# Patient Record
Sex: Female | Born: 1960 | Race: White | Hispanic: No | Marital: Married | Smoking: Never smoker
Health system: Southern US, Community
[De-identification: ages and names within clinical notes are randomized; demographics above are authoritative.]

## PROBLEM LIST (undated history)

## (undated) DIAGNOSIS — F439 Reaction to severe stress, unspecified: Secondary | ICD-10-CM

## (undated) DIAGNOSIS — M1990 Unspecified osteoarthritis, unspecified site: Secondary | ICD-10-CM

## (undated) DIAGNOSIS — K219 Gastro-esophageal reflux disease without esophagitis: Secondary | ICD-10-CM

## (undated) DIAGNOSIS — D649 Anemia, unspecified: Secondary | ICD-10-CM

## (undated) DIAGNOSIS — H3530 Unspecified macular degeneration: Secondary | ICD-10-CM

## (undated) DIAGNOSIS — C73 Malignant neoplasm of thyroid gland: Secondary | ICD-10-CM

## (undated) DIAGNOSIS — K5900 Constipation, unspecified: Secondary | ICD-10-CM

## (undated) DIAGNOSIS — K635 Polyp of colon: Secondary | ICD-10-CM

## (undated) DIAGNOSIS — M2550 Pain in unspecified joint: Secondary | ICD-10-CM

## (undated) DIAGNOSIS — H35069 Retinal vasculitis, unspecified eye: Secondary | ICD-10-CM

## (undated) DIAGNOSIS — E039 Hypothyroidism, unspecified: Secondary | ICD-10-CM

## (undated) DIAGNOSIS — T7840XA Allergy, unspecified, initial encounter: Secondary | ICD-10-CM

## (undated) DIAGNOSIS — L739 Follicular disorder, unspecified: Secondary | ICD-10-CM

## (undated) DIAGNOSIS — M549 Dorsalgia, unspecified: Secondary | ICD-10-CM

## (undated) DIAGNOSIS — L709 Acne, unspecified: Secondary | ICD-10-CM

## (undated) DIAGNOSIS — H269 Unspecified cataract: Secondary | ICD-10-CM

## (undated) DIAGNOSIS — E785 Hyperlipidemia, unspecified: Secondary | ICD-10-CM

## (undated) HISTORY — DX: Pain in unspecified joint: M25.50

## (undated) HISTORY — DX: Malignant neoplasm of thyroid gland: C73

## (undated) HISTORY — DX: Hypothyroidism, unspecified: E03.9

## (undated) HISTORY — DX: Acne, unspecified: L70.9

## (undated) HISTORY — DX: Retinal vasculitis, unspecified eye: H35.069

## (undated) HISTORY — DX: Follicular disorder, unspecified: L73.9

## (undated) HISTORY — DX: Constipation, unspecified: K59.00

## (undated) HISTORY — PX: BREAST BIOPSY: SHX20

## (undated) HISTORY — DX: Unspecified osteoarthritis, unspecified site: M19.90

## (undated) HISTORY — PX: TUBAL LIGATION: SHX77

## (undated) HISTORY — DX: Gastro-esophageal reflux disease without esophagitis: K21.9

## (undated) HISTORY — PX: TOTAL ABDOMINAL HYSTERECTOMY W/ BILATERAL SALPINGOOPHORECTOMY: SHX83

## (undated) HISTORY — DX: Hyperlipidemia, unspecified: E78.5

## (undated) HISTORY — PX: THYROIDECTOMY: SHX17

## (undated) HISTORY — DX: Unspecified macular degeneration: H35.30

## (undated) HISTORY — DX: Polyp of colon: K63.5

## (undated) HISTORY — DX: Unspecified cataract: H26.9

## (undated) HISTORY — DX: Allergy, unspecified, initial encounter: T78.40XA

## (undated) HISTORY — DX: Dorsalgia, unspecified: M54.9

## (undated) HISTORY — DX: Anemia, unspecified: D64.9

## (undated) HISTORY — PX: OTHER SURGICAL HISTORY: SHX169

## (undated) HISTORY — PX: ABDOMINAL HYSTERECTOMY: SHX81

## (undated) HISTORY — DX: Reaction to severe stress, unspecified: F43.9

---

## 2019-10-08 LAB — HM COLONOSCOPY

## 2020-04-09 DIAGNOSIS — J029 Acute pharyngitis, unspecified: Secondary | ICD-10-CM | POA: Insufficient documentation

## 2020-04-09 DIAGNOSIS — K1120 Sialoadenitis, unspecified: Secondary | ICD-10-CM | POA: Insufficient documentation

## 2020-04-09 DIAGNOSIS — Z20822 Contact with and (suspected) exposure to covid-19: Secondary | ICD-10-CM | POA: Insufficient documentation

## 2020-07-15 DIAGNOSIS — Z1371 Encounter for nonprocreative screening for genetic disease carrier status: Secondary | ICD-10-CM

## 2020-07-15 DIAGNOSIS — Z803 Family history of malignant neoplasm of breast: Secondary | ICD-10-CM

## 2020-07-15 HISTORY — DX: Encounter for nonprocreative screening for genetic disease carrier status: Z13.71

## 2020-07-15 HISTORY — DX: Family history of malignant neoplasm of breast: Z80.3

## 2020-07-22 ENCOUNTER — Ambulatory Visit: Payer: Self-pay | Admitting: Family Medicine

## 2020-07-24 ENCOUNTER — Encounter: Payer: Self-pay | Admitting: Family Medicine

## 2020-07-24 ENCOUNTER — Other Ambulatory Visit: Payer: Self-pay

## 2020-07-24 ENCOUNTER — Ambulatory Visit: Payer: 59 | Admitting: Family Medicine

## 2020-07-24 VITALS — BP 135/76 | HR 73 | Temp 98.4°F | Resp 18 | Ht 68.0 in | Wt 218.6 lb

## 2020-07-24 DIAGNOSIS — Z23 Encounter for immunization: Secondary | ICD-10-CM | POA: Insufficient documentation

## 2020-07-24 DIAGNOSIS — H349 Unspecified retinal vascular occlusion: Secondary | ICD-10-CM | POA: Insufficient documentation

## 2020-07-24 DIAGNOSIS — E039 Hypothyroidism, unspecified: Secondary | ICD-10-CM

## 2020-07-24 DIAGNOSIS — N6310 Unspecified lump in the right breast, unspecified quadrant: Secondary | ICD-10-CM

## 2020-07-24 DIAGNOSIS — Z7689 Persons encountering health services in other specified circumstances: Secondary | ICD-10-CM | POA: Diagnosis not present

## 2020-07-24 DIAGNOSIS — K1379 Other lesions of oral mucosa: Secondary | ICD-10-CM | POA: Diagnosis not present

## 2020-07-24 DIAGNOSIS — Z803 Family history of malignant neoplasm of breast: Secondary | ICD-10-CM | POA: Insufficient documentation

## 2020-07-24 DIAGNOSIS — Z0000 Encounter for general adult medical examination without abnormal findings: Secondary | ICD-10-CM

## 2020-07-24 DIAGNOSIS — Z85850 Personal history of malignant neoplasm of thyroid: Secondary | ICD-10-CM | POA: Insufficient documentation

## 2020-07-24 DIAGNOSIS — E782 Mixed hyperlipidemia: Secondary | ICD-10-CM

## 2020-07-24 DIAGNOSIS — M1990 Unspecified osteoarthritis, unspecified site: Secondary | ICD-10-CM

## 2020-07-24 NOTE — Assessment & Plan Note (Signed)
Approx > 15 years ago was found to have a growth next to her uvula that her previous PCP had followed.  Had met with a PCP in [place] that had encouraged her to schedule with an ENT, which I agree would be good for evaluation as medical technology has progressed over the past 15 years for a repeat evaluation.  Plan: 1. Referral to ENT placed 

## 2020-07-24 NOTE — Assessment & Plan Note (Signed)
Long standing history of abnormal mammogram with lump in right breast.  Last Mammo 05/17/2019 in [place] that required diagnostic mammogram and bilateral breast ultrasound.  Will order repeat as we are just over a year from last imaging and will imaging location will likely need records for comparison.  Plan: 1. Diagnostic mammogram ordered 2. Bilateral breast ultrasound ordered 3. Will contact when we receive the results

## 2020-07-24 NOTE — Assessment & Plan Note (Signed)
Reports multiple family members with a history of breast cancer.  Is interested in genetic testing.  Has an appointment to establish with local OB/GYN next week.  Will notify new OB/GYN of patient's interest in genetics testing.

## 2020-07-24 NOTE — Assessment & Plan Note (Addendum)
New patient establishment to [facility] for primary care.  Plan: 1. Have baseline labs drawn 2. RTC in 6 months for CPE, sooner, if needed 3. Referral to dermatology to establish as patient

## 2020-07-24 NOTE — Assessment & Plan Note (Signed)
Status unknown.  Recheck labs.  Followup after labs.  

## 2020-07-24 NOTE — Assessment & Plan Note (Signed)
Hx hypothyroidism s/p thyroid surgery.  Has an appointment with [facility] Endocrinology in October 2021.

## 2020-07-24 NOTE — Assessment & Plan Note (Signed)
Pt < age 59.  Needs annual influenza vaccine.  VIS provided.  Plan: 1. Administer Quad flu vaccine.

## 2020-07-24 NOTE — Patient Instructions (Signed)
Have your labs drawn with [facility] and I will contact you once we receive the results.  I have put in an order for a diagnostic mammogram and bilateral breast ultrasound.   Call the Imaging Center below anytime to schedule your own appointment now that order has been placed.  [facility] [address] [place], [place] [address] Phone: [phone]  [facility] [address] [place],  [address] Phone: [phone]  A referral to ENT for growth near your uvula has been placed today.  If you have not heard from the specialty office or our referral coordinator within 1 week, please let us know and we will follow up with the referral coordinator for an update.  A referral to Dermatology has been placed today.  If you have not heard from the specialty office or our referral coordinator within 1 week, please let us know and we will follow up with the referral coordinator for an update.  I will send a note to your new OB/GYN provider with [facility] that you are interested in BRCA and genetic testing due to your large family history of breast cancer.  We will plan to see you back in 6 months for your physical and sooner should any need arise  You will receive a survey after today's visit either digitally by e-mail or paper by USPS mail. Your experiences and feedback matter to us.  Please respond so we know how we are doing as we provide care for you.  Call us with any questions/concerns/needs.  It is my goal to be available to you for your health concerns.  Thanks for choosing me to be a partner in your healthcare needs!  Nicole Marie Malfi, FNP-C Family Nurse Practitioner [facility] Phone: [phone]  

## 2020-07-24 NOTE — Progress Notes (Signed)
Subjective:    Patient ID: Lashay Osborne, female    DOB: Jul 22, 1961, 59 y.o.   MRN: [id number]  Bonita Brindisi is a 59 y.o. female presenting on 07/24/2020 for Establish Care   HPI  Ms. Khim presents to clinic as a new patient to establish at [facility].  Was previously followed by a PCP in [place].  Records have been sent to office in anticipation of today's appointment.  Reviewed previous medical, surgical and family history.  Has acute concerns for referral to ENT and dermatology to establish as a new patient.  No other acute concerns today.  Depression screen PHQ 2/9 07/24/2020  Decreased Interest 0  Down, Depressed, Hopeless 0  PHQ - 2 Score 0    Social History   Tobacco Use  . Smoking status: Never Smoker  . Smokeless tobacco: Never Used  Substance Use Topics  . Alcohol use: Yes  . Drug use: Never    Review of Systems  Constitutional: Negative.   HENT: Negative.   Eyes: Negative.   Respiratory: Negative.   Cardiovascular: Negative.   Gastrointestinal: Negative.   Endocrine: Negative.   Genitourinary: Negative.   Musculoskeletal: Negative.   Skin: Negative.   Allergic/Immunologic: Negative.   Neurological: Negative.   Hematological: Negative.   Psychiatric/Behavioral: Negative.    Per HPI unless specifically indicated above     Objective:    BP 135/76 (BP Location: Left Arm, Patient Position: Sitting, Cuff Size: Normal)   Pulse 73   Temp 98.4 F (36.9 C) (Oral)   Resp 18   Ht _0  (1.727 m)   Wt 218 lb 9.6 oz (99.2 kg)   SpO2 100%   BMI 33.24 kg/m   Wt Readings from Last 3 Encounters:  07/24/20 218 lb 9.6 oz (99.2 kg)    Physical Exam Vitals reviewed.  Constitutional:      General: She is not in acute distress.    Appearance: Normal appearance. She is well-developed and well-groomed. She is obese. She is not ill-appearing or toxic-appearing.  HENT:     Head: Normocephalic and atraumatic.     Nose:     Comments: Lizbeth Bark is in place, covering  mouth and nose.    Mouth/Throat:   Eyes:     General: Lids are normal. Vision grossly intact.        Right eye: No discharge.        Left eye: No discharge.     Extraocular Movements: Extraocular movements intact.     Conjunctiva/sclera: Conjunctivae normal.     Pupils: Pupils are equal, round, and reactive to light.  Cardiovascular:     Rate and Rhythm: Normal rate and regular rhythm.     Pulses: Normal pulses.     Heart sounds: Normal heart sounds. No murmur heard.  No friction rub. No gallop.   Pulmonary:     Effort: Pulmonary effort is normal. No respiratory distress.     Breath sounds: Normal breath sounds.  Musculoskeletal:     Right lower leg: No edema.     Left lower leg: No edema.  Skin:    General: Skin is warm and dry.     Capillary Refill: Capillary refill takes less than 2 seconds.  Neurological:     General: No focal deficit present.     Mental Status: She is alert and oriented to person, place, and time.  Psychiatric:        Attention and Perception: Attention and perception normal.  Mood and Affect: Mood and affect normal.        Speech: Speech normal.        Behavior: Behavior normal. Behavior is cooperative.        Thought Content: Thought content normal.        Cognition and Memory: Cognition and memory normal.        Judgment: Judgment normal.    No results found for this or any previous visit.    Assessment & Plan:   Problem List Items Addressed This Visit      Digestive   Disorder of uvula    Approx > 15 years ago was found to have a growth next to her uvula that her previous PCP had followed.  Had met with a PCP in [place] that had encouraged her to schedule with an ENT, which I agree would be good for evaluation as medical technology has progressed over the past 15 years for a repeat evaluation.  Plan: 1. Referral to ENT placed      Relevant Orders   Ambulatory referral to ENT     Endocrine   Hypothyroidism    Hx hypothyroidism s/p  thyroid surgery.  Has an appointment with [facility] Endocrinology in October 2021.      Relevant Medications   levothyroxine (SYNTHROID) 125 MCG tablet   Other Relevant Orders   Thyroid Panel With TSH     Musculoskeletal and Integument   Arthritis   Relevant Medications   acetaminophen (TYLENOL) 325 MG tablet     Other   Encounter to establish care with new doctor - Primary    New patient establishment to [facility] for primary care.  Plan: 1. Have baseline labs drawn 2. RTC in 6 months for CPE, sooner, if needed 3. Referral to dermatology to establish as patient       Relevant Orders   Ambulatory referral to Dermatology   Needs flu shot    Pt < age 65.  Needs annual influenza vaccine.  VIS provided.  Plan: 1. Administer Quad flu vaccine.       Relevant Orders   Flu Vaccine QUAD 6+ mos PF IM (Fluarix Quad PF) (Completed)   Lump of right breast    Long standing history of abnormal mammogram with lump in right breast.  Last Mammo 05/17/2019 in [place] that required diagnostic mammogram and bilateral breast ultrasound.  Will order repeat as we are just over a year from last imaging and will imaging location will likely need records for comparison.  Plan: 1. Diagnostic mammogram ordered 2. Bilateral breast ultrasound ordered 3. Will contact when we receive the results      Relevant Orders   MM DIAG BREAST TOMO BILATERAL   US BREAST LTD UNI LEFT INC AXILLA   US BREAST LTD UNI RIGHT INC AXILLA   Mixed hyperlipidemia    Status unknown.  Recheck labs. Followup after labs.       Relevant Orders   Lipid Profile   Family history of breast cancer    Reports multiple family members with a history of breast cancer.  Is interested in genetic testing.  Has an appointment to establish with local OB/GYN next week.  Will notify new OB/GYN of patient's interest in genetics testing.       Other Visit Diagnoses    Routine medical exam       Relevant Orders   CBC with  Differential   CMP14+EGFR      No orders of the  defined types were placed in this encounter.  Follow up plan: Return in about 6 months (around 01/21/2021) for CPE.   Harlin Rain, [place] Family Nurse Practitioner [facility] 07/24/2020, 5:01 PM

## 2020-07-27 ENCOUNTER — Encounter: Payer: Self-pay | Admitting: Obstetrics and Gynecology

## 2020-07-27 NOTE — Progress Notes (Signed)
 PCP: Malfi, Nicole M, FNP   Chief Complaint  Patient presents with  . Gynecologic Exam    occassional tenderness in RB only     HPI:      [name] is a 59 y.o. No obstetric history on file. whose LMP was No LMP recorded. Patient has had a hysterectomy., presents today for her NP annual examination.  Her menses are absent due to TAH/BSO due bilat dermoids/ovar cysts. She does have vasomotor sx and is currently on estradiol 0.5 mg. Was taking daily but started to wean down to 2-3 times wkly due to Rx running out. Felt better when taking daily.  Sex activity: single partner, contraception - status post hysterectomy. She does not have vaginal dryness.  Last Pap: not recent; s/p hyst. No hx of abn paps. Hx of STDs: HSV  Last mammogram: 05/17/19 in [place]. Results were: normal--routine follow-up in 12 months. Has had several yrs of dx mammo and u/s due to RT breast abnormality. Pt states it's deep and not palpable, but does ache sometimes (denies caffeine use, no change with ERT dose). Dx mammo and u/s ordered by PCP last wk; pt signed med release form at [facility] today and will sched mammo next wk. Hx of other RT breast mass in past with breast MRIs and neg breast bx.   There is a FH of breast cancer in her MGM, genetic testing not done. There is no FH of ovarian cancer. The patient does do self-breast exams.  Colonoscopy: 2020 with polyps;  Repeat due after 3 yrs years.  DEXA: a few yrs ago, normal spine and hip.  Tobacco use: The patient denies current or previous tobacco use. Alcohol use: none  No drug use Exercise: moderately active  She does get adequate calcium and Vitamin D in her diet.  Labs with PCP.   Past Medical History:  Diagnosis Date  . Allergy   . Cataracts, bilateral   . Colon polyp   . Hyperlipidemia   . Macular degeneration   . Retinal vasculitis   . Thyroid cancer (HCC)     Past Surgical History:  Procedure Laterality Date  . ovarial cystectomy    .  THYROIDECTOMY    . TOTAL ABDOMINAL HYSTERECTOMY W/ BILATERAL SALPINGOOPHORECTOMY     dermoids, ovar cysts  . TUBAL LIGATION      Family History  Problem Relation Age of Onset  . Heart disease Father   . Cancer Father        skin  . Depression Father   . Breast cancer Maternal Grandmother        early yrs    Social History   Socioeconomic History  . Marital status: Married    Spouse name: Not on file  . Number of children: Not on file  . Years of education: Not on file  . Highest education level: Not on file  Occupational History  . Not on file  Tobacco Use  . Smoking status: Never Smoker  . Smokeless tobacco: Never Used  Substance and Sexual Activity  . Alcohol use: Yes  . Drug use: Never  . Sexual activity: Yes    Birth control/protection: Surgical    Comment: Hysterectomy  Other Topics Concern  . Not on file  Social History Narrative  . Not on file   Social Determinants of Health   Financial Resource Strain:   . Difficulty of Paying Living Expenses: Not on file  Food Insecurity:   . Worried About Running Out   of Food in the Last Year: Not on file  . Ran Out of Food in the Last Year: Not on file  Transportation Needs:   . Lack of Transportation (Medical): Not on file  . Lack of Transportation (Non-Medical): Not on file  Physical Activity:   . Days of Exercise per Week: Not on file  . Minutes of Exercise per Session: Not on file  Stress:   . Feeling of Stress : Not on file  Social Connections:   . Frequency of Communication with Friends and Family: Not on file  . Frequency of Social Gatherings with Friends and Family: Not on file  . Attends Religious Services: Not on file  . Active Member of Clubs or Organizations: Not on file  . Attends Club or Organization Meetings: Not on file  . Marital Status: Not on file  Intimate Partner Violence:   . Fear of Current or Ex-Partner: Not on file  . Emotionally Abused: Not on file  . Physically Abused: Not on file    . Sexually Abused: Not on file     Current Outpatient Medications:  .  acetaminophen (TYLENOL) 325 MG tablet, Take 650 mg by mouth every 6 (six) hours as needed., Disp: , Rfl:  .  calcium-vitamin D (OSCAL WITH D) 500-200 MG-UNIT tablet, Take 1 tablet by mouth., Disp: , Rfl:  .  levothyroxine (SYNTHROID) 125 MCG tablet, Take 125 mcg by mouth daily before breakfast., Disp: , Rfl:  .  loratadine (CLARITIN) 10 MG tablet, Take 10 mg by mouth daily., Disp: , Rfl:  .  Multiple Vitamins-Minerals (PRESERVISION AREDS 2 PO), Take by mouth., Disp: , Rfl:  .  Probiotic Product (PROBIOTIC-10 PO), Take by mouth., Disp: , Rfl:  .  valACYclovir (VALTREX) 1000 MG tablet, Take 1,000 mg by mouth 2 (two) times daily., Disp: , Rfl:  .  estradiol (ESTRACE) 0.5 MG tablet, Take 1 tablet (0.5 mg total) by mouth daily., Disp: 90 tablet, Rfl: 3     ROS:  Review of Systems  Constitutional: Negative for fatigue, fever and unexpected weight change.  Respiratory: Negative for cough, shortness of breath and wheezing.   Cardiovascular: Negative for chest pain, palpitations and leg swelling.  Gastrointestinal: Negative for blood in stool, constipation, diarrhea, nausea and vomiting.  Endocrine: Negative for cold intolerance, heat intolerance and polyuria.  Genitourinary: Negative for dyspareunia, dysuria, flank pain, frequency, genital sores, hematuria, menstrual problem, pelvic pain, urgency, vaginal bleeding, vaginal discharge and vaginal pain.  Musculoskeletal: Negative for back pain, joint swelling and myalgias.  Skin: Negative for rash.  Neurological: Positive for headaches. Negative for dizziness, syncope, light-headedness and numbness.  Hematological: Negative for adenopathy.  Psychiatric/Behavioral: Negative for agitation, confusion, sleep disturbance and suicidal ideas. The patient is not nervous/anxious.    BREAST: tenderness   Objective: BP 120/70   Ht 5' 8" (1.727 m)   Wt 216 lb (98 kg)   BMI 32.84  kg/m    Physical Exam Constitutional:      Appearance: She is well-developed.  Genitourinary:     Vulva and vagina normal.     No vaginal discharge, erythema or tenderness.     Cervix is absent.     Uterus is absent.     No right or left adnexal mass present.     Right adnexa absent.     Right adnexa not tender.     Left adnexa absent.     Left adnexa not tender.     Genitourinary Comments: UTERUS/CX SURG REM    Neck:     Thyroid: No thyromegaly.  Cardiovascular:     Rate and Rhythm: Normal rate and regular rhythm.     Heart sounds: Normal heart sounds. No murmur heard.   Pulmonary:     Effort: Pulmonary effort is normal.     Breath sounds: Normal breath sounds.  Chest:     Breasts:        Right: No mass, nipple discharge, skin change or tenderness.        Left: No mass, nipple discharge, skin change or tenderness.  Abdominal:     Palpations: Abdomen is soft.     Tenderness: There is no abdominal tenderness. There is no guarding.  Musculoskeletal:        General: Normal range of motion.     Cervical back: Normal range of motion.  Neurological:     General: No focal deficit present.     Mental Status: She is alert and oriented to person, place, and time.     Cranial Nerves: No cranial nerve deficit.  Skin:    General: Skin is warm and dry.  Psychiatric:        Mood and Affect: Mood normal.        Behavior: Behavior normal.        Thought Content: Thought content normal.        Judgment: Judgment normal.  Vitals reviewed.     Assessment/Plan:  Encounter for annual routine gynecological examination  Abnormal mammogram of right breast--pt is sched mammo and u/s, ordered by PCP  Family history of breast cancer - Plan: Integrated BRACAnalysis ([facility]); MyRisk testing discussed and done today. Will call with results.   Hormone replacement therapy (HRT)--Rx RF estradiol. Discussed safety of taking daily if she feels better. F/u prn.   Vasomotor  symptoms due to menopause   Meds ordered this encounter  Medications  .                       Gae Dry [[id number]]  . estradiol (ESTRACE) 0.5 MG tablet    Sig: Take 1 tablet (0.5 mg total) by mouth daily.    Dispense:  90 tablet    Refill:  3    Order Specific Question:   Supervising Provider    Answer:   Gae Dry [[id number]]            GYN counsel breast self exam, mammography screening, use and side effects of HRT, menopause, adequate intake of calcium and vitamin D, diet and exercise    F/U  Return in about 1 year (around 07/28/2021).  Tariah Transue B. Findley Vi, PA-C 07/28/2020 2:32 PM

## 2020-07-28 ENCOUNTER — Other Ambulatory Visit: Payer: Self-pay

## 2020-07-28 ENCOUNTER — Encounter: Payer: Self-pay | Admitting: Obstetrics and Gynecology

## 2020-07-28 ENCOUNTER — Ambulatory Visit (INDEPENDENT_AMBULATORY_CARE_PROVIDER_SITE_OTHER): Payer: 59 | Admitting: Obstetrics and Gynecology

## 2020-07-28 VITALS — BP 120/70 | Ht 68.0 in | Wt 216.0 lb

## 2020-07-28 DIAGNOSIS — Z803 Family history of malignant neoplasm of breast: Secondary | ICD-10-CM

## 2020-07-28 DIAGNOSIS — Z79890 Hormone replacement therapy (postmenopausal): Secondary | ICD-10-CM | POA: Diagnosis not present

## 2020-07-28 DIAGNOSIS — Z01419 Encounter for gynecological examination (general) (routine) without abnormal findings: Secondary | ICD-10-CM | POA: Diagnosis not present

## 2020-07-28 DIAGNOSIS — R928 Other abnormal and inconclusive findings on diagnostic imaging of breast: Secondary | ICD-10-CM

## 2020-07-28 DIAGNOSIS — N951 Menopausal and female climacteric states: Secondary | ICD-10-CM

## 2020-07-28 MED ORDER — ESTRADIOL 0.5 MG PO TABS: 0.5000 mg | ORAL_TABLET | Freq: Every day | ORAL | 3 refills | Status: DC

## 2020-07-28 NOTE — Patient Instructions (Signed)
I value your feedback and entrusting us with your care. If you get a [facility] patient survey, I would appreciate you taking the time to let us know about your experience today. Thank you!  As of October 24, 2019, your lab results will be released to your MyChart immediately, before I even have a chance to see them. Please give me time to review them and contact you if there are any abnormalities. Thank you for your patience.  

## 2020-07-29 LAB — CBC WITH DIFFERENTIAL/PLATELET
Basophils Absolute: 0.1 10*3/uL (ref 0.0–0.2)
Basos: 1 %
EOS (ABSOLUTE): 0.1 10*3/uL (ref 0.0–0.4)
Eos: 2 %
Hematocrit: 37.7 % (ref 34.0–46.6)
Hemoglobin: 12.8 g/dL (ref 11.1–15.9)
Immature Grans (Abs): 0 10*3/uL (ref 0.0–0.1)
Immature Granulocytes: 0 %
Lymphocytes Absolute: 2.1 10*3/uL (ref 0.7–3.1)
Lymphs: 31 %
MCH: 30.5 pg (ref 26.6–33.0)
MCHC: 34 g/dL (ref 31.5–35.7)
MCV: 90 fL (ref 79–97)
Monocytes Absolute: 0.4 10*3/uL (ref 0.1–0.9)
Monocytes: 6 %
Neutrophils Absolute: 4.2 10*3/uL (ref 1.4–7.0)
Neutrophils: 60 %
Platelets: 257 10*3/uL (ref 150–450)
RBC: 4.19 x10E6/uL (ref 3.77–5.28)
RDW: 12.1 % (ref 11.7–15.4)
WBC: 6.9 10*3/uL (ref 3.4–10.8)

## 2020-07-29 LAB — CMP14+EGFR
ALT: 16 IU/L (ref 0–32)
AST: 20 IU/L (ref 0–40)
Albumin/Globulin Ratio: 1.5 (ref 1.2–2.2)
Albumin: 4.4 g/dL (ref 3.8–4.9)
Alkaline Phosphatase: 95 IU/L (ref 44–121)
BUN/Creatinine Ratio: 19 (ref 9–23)
BUN: 15 mg/dL (ref 6–24)
Bilirubin Total: 1.1 mg/dL (ref 0.0–1.2)
CO2: 26 mmol/L (ref 20–29)
Calcium: 9.3 mg/dL (ref 8.7–10.2)
Chloride: 101 mmol/L (ref 96–106)
Creatinine, Ser: 0.81 mg/dL (ref 0.57–1.00)
GFR calc Af Amer: 93 mL/min/{1.73_m2} (ref >59)
GFR calc non Af Amer: 81 mL/min/{1.73_m2} (ref >59)
Globulin, Total: 2.9 g/dL (ref 1.5–4.5)
Glucose: 93 mg/dL (ref 65–99)
Potassium: 4.3 mmol/L (ref 3.5–5.2)
Sodium: 141 mmol/L (ref 134–144)
Total Protein: 7.3 g/dL (ref 6.0–8.5)

## 2020-07-29 LAB — THYROID PANEL WITH TSH
Free Thyroxine Index: >6 — ABNORMAL HIGH (ref 1.2–4.9)
T3 Uptake Ratio: >56 % — ABNORMAL HIGH (ref 24–39)
T4, Total: 10.7 ug/dL (ref 4.5–12.0)
TSH: 0.88 u[IU]/mL (ref 0.450–4.500)

## 2020-07-29 LAB — LIPID PANEL
Chol/HDL Ratio: 4.5 ratio — ABNORMAL HIGH (ref 0.0–4.4)
Cholesterol, Total: 189 mg/dL (ref 100–199)
HDL: 42 mg/dL (ref >39)
LDL Chol Calc (NIH): 112 mg/dL — ABNORMAL HIGH (ref 0–99)
Triglycerides: 203 mg/dL — ABNORMAL HIGH (ref 0–149)
VLDL Cholesterol Cal: 35 mg/dL (ref 5–40)

## 2020-08-06 ENCOUNTER — Encounter: Payer: Self-pay | Admitting: Obstetrics and Gynecology

## 2020-08-11 ENCOUNTER — Other Ambulatory Visit: Payer: Self-pay

## 2020-08-11 ENCOUNTER — Encounter: Payer: Self-pay | Admitting: Family Medicine

## 2020-08-11 ENCOUNTER — Telehealth: Payer: Self-pay | Admitting: Family Medicine

## 2020-08-11 ENCOUNTER — Telehealth (INDEPENDENT_AMBULATORY_CARE_PROVIDER_SITE_OTHER): Payer: 59 | Admitting: Family Medicine

## 2020-08-11 DIAGNOSIS — R82998 Other abnormal findings in urine: Secondary | ICD-10-CM | POA: Insufficient documentation

## 2020-08-11 DIAGNOSIS — R631 Polydipsia: Secondary | ICD-10-CM | POA: Diagnosis not present

## 2020-08-11 NOTE — Progress Notes (Signed)
Virtual Visit via Telephone  The purpose of this virtual visit is to provide medical care while limiting exposure to the novel coronavirus (COVID19) for both patient and office staff.  Consent was obtained for phone visit:  Yes.   Answered questions that patient had about telehealth interaction:  Yes.   I discussed the limitations, risks, security and privacy concerns of performing an evaluation and management service by telephone. I also discussed with the patient that there may be a patient responsible charge related to this service. The patient expressed understanding and agreed to proceed.  Patient is at home and is accessed via telephone Services are provided by Harlin Rain, FNP-C from [facility])  ---------------------------------------------------------------------- Chief Complaint  Patient presents with  . Back Pain    intermittent sharp pain under the left lower rib cage in the back, that only happens when she moves in certain direction leaning back or forward on the left side x 2 days .  Dark urine, increasingly thirst, fatigue. Denies dysuria, urgency or increase in her urinary frequency. She did notice a sharp pain in the groin area after urination.     S: Reviewed CMA documentation. I have called patient and gathered additional HPI as follows:  Jeanne Velez presents for virtual telemedicine visit for concerns of intermittent sharp pain under the left lower rib cage in her back.  Reports pain happens with certain movements, such as leaning back and forward on the left side x 2 days.  Did have a sharp pain x 1 in the groin area after urination.  Has had dark colored urine, fatigue, and increased amount of thirst.  Denies any dysuria, urinary urgency, hesitancy, frequency or incomplete emptying.  Reports approx 14 days ago did have a viral illness with diarrhea that she had taken some ibuprofen and imodium for with relief of symptoms.  Denies any fevers,  chills, sweats, body ache, cough, shortness of breath, sinus pain or pressure, headache, abdominal pain, diarrhea  Patient is currently home Denies any high risk travel to areas of current concern for COVID19. Denies any known or suspected exposure to person with or possibly with COVID19.  Past Medical History:  Diagnosis Date  . Allergy   . BRCA negative 07/2020   MyRisk neg  . Cataracts, bilateral   . Colon polyp   . Family history of breast cancer 07/2020   IBIS=15.1%/riskscore=6.7%  . Hyperlipidemia   . Macular degeneration   . Retinal vasculitis   . Thyroid cancer [facility])    Social History   Tobacco Use  . Smoking status: Never Smoker  . Smokeless tobacco: Never Used  Vaping Use  . Vaping Use: Never used  Substance Use Topics  . Alcohol use: Yes  . Drug use: Never    Current Outpatient Medications:  .  acetaminophen (TYLENOL) 325 MG tablet, Take 650 mg by mouth every 6 (six) hours as needed., Disp: , Rfl:  .  calcium-vitamin D (OSCAL WITH D) 500-200 MG-UNIT tablet, Take 1 tablet by mouth., Disp: , Rfl:  .  estradiol (ESTRACE) 0.5 MG tablet, Take 1 tablet (0.5 mg total) by mouth daily. (Patient taking differently: Take 0.5 mg by mouth 3 (three) times a week. ), Disp: 90 tablet, Rfl: 3 .  levothyroxine (SYNTHROID) 125 MCG tablet, Take 125 mcg by mouth daily before breakfast., Disp: , Rfl:  .  loratadine (CLARITIN) 10 MG tablet, Take 10 mg by mouth daily., Disp: , Rfl:  .  Multiple Vitamins-Minerals (PRESERVISION AREDS  2 PO), Take by mouth., Disp: , Rfl:  .  Probiotic Product (PROBIOTIC-10 PO), Take by mouth., Disp: , Rfl:  .  valACYclovir (VALTREX) 1000 MG tablet, Take 1,000 mg by mouth 2 (two) times daily as needed. , Disp: , Rfl:   Depression screen PHQ 2/9 07/24/2020  Decreased Interest 0  Down, Depressed, Hopeless 0  PHQ - 2 Score 0    No flowsheet data found.  -------------------------------------------------------------------------- O: No physical exam  performed due to remote telephone encounter.  Physical Exam: Patient remotely monitored without video.  Verbal communication appropriate.  Cognition normal.  Recent Results (from the past 2160 hour(s))  CBC with Differential     Status: None   Collection Time: 07/28/20  9:14 AM  Result Value Ref Range   WBC 6.9 3.4 - 10.8 x10E3/uL   RBC 4.19 3.77 - 5.28 x10E6/uL   Hemoglobin 12.8 11.1 - 15.9 g/dL   Hematocrit 37.7 34.0 - 46.6 %   MCV 90 79 - 97 fL   MCH 30.5 26.6 - 33.0 pg   MCHC 34.0 31 - 35 g/dL   RDW 12.1 11.7 - 15.4 %   Platelets 257 150 - 450 x10E3/uL   Neutrophils 60 Not Estab. %   Lymphs 31 Not Estab. %   Monocytes 6 Not Estab. %   Eos 2 Not Estab. %   Basos 1 Not Estab. %   Neutrophils Absolute 4.2 1 - 7 x10E3/uL   Lymphocytes Absolute 2.1 0 - 3 x10E3/uL   Monocytes Absolute 0.4 0 - 0 x10E3/uL   EOS (ABSOLUTE) 0.1 0.0 - 0.4 x10E3/uL   Basophils Absolute 0.1 0 - 0 x10E3/uL   Immature Granulocytes 0 Not Estab. %   Immature Grans (Abs) 0.0 0.0 - 0.1 x10E3/uL  CMP14+EGFR     Status: None   Collection Time: 07/28/20  9:14 AM  Result Value Ref Range   Glucose 93 65 - 99 mg/dL   BUN 15 6 - 24 mg/dL   Creatinine, Ser 0.81 0.57 - 1.00 mg/dL   GFR calc non Af Amer 81 >59 mL/min/1.73   GFR calc Af Amer 93 >59 mL/min/1.73    Comment: **[facility] currently reports eGFR in compliance with the current**   recommendations of the [employer]. [facility] will   update reporting as new guidelines are published from the NKF-ASN   Task force.    BUN/Creatinine Ratio 19 9 - 23   Sodium 141 134 - 144 mmol/L   Potassium 4.3 3.5 - 5.2 mmol/L   Chloride 101 96 - 106 mmol/L   CO2 26 20 - 29 mmol/L   Calcium 9.3 8.7 - 10.2 mg/dL   Total Protein 7.3 6.0 - 8.5 g/dL   Albumin 4.4 3.8 - 4.9 g/dL   Globulin, Total 2.9 1.5 - 4.5 g/dL   Albumin/Globulin Ratio 1.5 1.2 - 2.2   Bilirubin Total 1.1 0.0 - 1.2 mg/dL   Alkaline Phosphatase 95 44 - 121 IU/L    Comment:                **Please note reference interval change**   AST 20 0 - 40 IU/L   ALT 16 0 - 32 IU/L  Lipid Profile     Status: Abnormal   Collection Time: 07/28/20  9:14 AM  Result Value Ref Range   Cholesterol, Total 189 100 - 199 mg/dL   Triglycerides 203 (H) 0 - 149 mg/dL   HDL 42 >39 mg/dL   VLDL Cholesterol Cal 35 5 -  40 mg/dL   LDL Chol Calc (NIH) 112 (H) 0 - 99 mg/dL   Chol/HDL Ratio 4.5 (H) 0.0 - 4.4 ratio    Comment:                                   T. Chol/HDL Ratio                                             Men  Women                               1/2 Avg.Risk  3.4    3.3                                   Avg.Risk  5.0    4.4                                2X Avg.Risk  9.6    7.1                                3X Avg.Risk 23.4   11.0   Thyroid Panel With TSH     Status: Abnormal   Collection Time: 07/28/20  9:14 AM  Result Value Ref Range   TSH 0.880 0.450 - 4.500 uIU/mL   T4, Total 10.7 4.5 - 12.0 ug/dL   T3 Uptake Ratio >56 (H) 24 - 39 %   Free Thyroxine Index >6.0 (H) 1.2 - 4.9    -------------------------------------------------------------------------- A&P:  Problem List Items Addressed This Visit      Other   Dark urine - Primary    Likely kidney vs bladder etiology.  Had labs drawn 07/28/2020 with normal kidney function results.  Has taken a few doses of ibuprofen since labs drawn on 07/28/2020.  Has had some increased thirst and fatigue.  Denies any abnormal bruising or lymphadenopathy.  Increased thirst, discussed could be secondary to fluid loss vs new onset DM, will have A1C added STAT.  Likely AKI, will have STAT labs drawn for evaluation, encouraged to have completed with [facility] in [place] on [address].  To increase water intake, can incorporate an electrolyte replacement beverage as well such as Gatorade, Powerade and/or Body Armor to help replace electrolytes.  Plan: 1. STAT labs ordered (CBC w/diff, CMP+GFR, PT/PTT, INR, A1C, U/A  and urine culture) Lab order faxed to [facility] in [place] on [address]. Will contact patient once labs are resulted and will notify on call provider if not returned by 5pm      Relevant Orders   TSH + free T4   CBC with Differential   CMP14+EGFR   Urinalysis, Routine w reflex microscopic   Urine Culture   Protime-INR   APTT   Increased thirst    See dark urine A/P      Relevant Orders   TSH + free T4   CBC with Differential   CMP14+EGFR   Urinalysis, Routine w reflex microscopic   Urine Culture   HgB  A1c      No orders of the defined types were placed in this encounter.   Follow-up: - Return after labs have resulted - Have labs drawn today, STAT with [facility]  Patient verbalizes understanding with the above medical recommendations including the limitation of remote medical advice.  Specific follow-up and call-back criteria were given for patient to follow-up or seek medical care more urgently if needed.  - Time spent in direct consultation with patient on phone: 6 minutes  Harlin Rain, [facility] 08/11/2020, 11:35 AM

## 2020-08-11 NOTE — Assessment & Plan Note (Addendum)
Likely kidney vs bladder etiology.  Had labs drawn 07/28/2020 with normal kidney function results.  Has taken a few doses of ibuprofen since labs drawn on 07/28/2020.  Has had some increased thirst and fatigue.  Denies any abnormal bruising or lymphadenopathy.  Increased thirst, discussed could be secondary to fluid loss vs new onset DM, will have A1C added STAT.  Likely AKI, will have STAT labs drawn for evaluation, encouraged to have completed with [facility] in [place] on [address].  To increase water intake, can incorporate an electrolyte replacement beverage as well such as Gatorade, Powerade and/or Body Armor to help replace electrolytes.  Plan: 1. STAT labs ordered (CBC w/diff, CMP+GFR, PT/PTT, INR, A1C, U/A and urine culture) Lab order faxed to [facility] in [place] on [address]. Will contact patient once labs are resulted and will notify on call provider if not returned by 5pm

## 2020-08-11 NOTE — Assessment & Plan Note (Signed)
See dark urine A/P

## 2020-08-11 NOTE — Telephone Encounter (Signed)
Telephone call to patient to discuss STAT labs from today.  All were within normal limits, awaiting A1C results.  Encouraged to increase fluids and will be in touch once A1C has resulted, likely tomorrow.  Patient verbalized understanding and denied any additional questions/concerns/needs.

## 2020-08-12 ENCOUNTER — Telehealth: Payer: Self-pay | Admitting: Obstetrics and Gynecology

## 2020-08-12 ENCOUNTER — Telehealth: Payer: Self-pay

## 2020-08-12 LAB — CMP14+EGFR
ALT: 14 IU/L (ref 0–32)
AST: 15 IU/L (ref 0–40)
Albumin/Globulin Ratio: 1.8 (ref 1.2–2.2)
Albumin: 4.8 g/dL (ref 3.8–4.9)
Alkaline Phosphatase: 109 IU/L (ref 44–121)
BUN/Creatinine Ratio: 16 (ref 9–23)
BUN: 13 mg/dL (ref 6–24)
Bilirubin Total: 0.6 mg/dL (ref 0.0–1.2)
CO2: 27 mmol/L (ref 20–29)
Calcium: 9.8 mg/dL (ref 8.7–10.2)
Chloride: 99 mmol/L (ref 96–106)
Creatinine, Ser: 0.8 mg/dL (ref 0.57–1.00)
GFR calc Af Amer: 95 mL/min/{1.73_m2} (ref >59)
GFR calc non Af Amer: 82 mL/min/{1.73_m2} (ref >59)
Globulin, Total: 2.7 g/dL (ref 1.5–4.5)
Glucose: 94 mg/dL (ref 65–99)
Potassium: 4.4 mmol/L (ref 3.5–5.2)
Sodium: 138 mmol/L (ref 134–144)
Total Protein: 7.5 g/dL (ref 6.0–8.5)

## 2020-08-12 LAB — CBC WITH DIFFERENTIAL/PLATELET
Basophils Absolute: 0.1 10*3/uL (ref 0.0–0.2)
Basos: 1 %
EOS (ABSOLUTE): 0.2 10*3/uL (ref 0.0–0.4)
Eos: 2 %
Hematocrit: 40.9 % (ref 34.0–46.6)
Hemoglobin: 13.6 g/dL (ref 11.1–15.9)
Immature Grans (Abs): 0 10*3/uL (ref 0.0–0.1)
Immature Granulocytes: 0 %
Lymphocytes Absolute: 2.5 10*3/uL (ref 0.7–3.1)
Lymphs: 34 %
MCH: 30.4 pg (ref 26.6–33.0)
MCHC: 33.3 g/dL (ref 31.5–35.7)
MCV: 92 fL (ref 79–97)
Monocytes Absolute: 0.5 10*3/uL (ref 0.1–0.9)
Monocytes: 7 %
Neutrophils Absolute: 4.2 10*3/uL (ref 1.4–7.0)
Neutrophils: 56 %
Platelets: 284 10*3/uL (ref 150–450)
RBC: 4.47 x10E6/uL (ref 3.77–5.28)
RDW: 11.8 % (ref 11.7–15.4)
WBC: 7.4 10*3/uL (ref 3.4–10.8)

## 2020-08-12 LAB — URINALYSIS, ROUTINE W REFLEX MICROSCOPIC
Bilirubin, UA: NEGATIVE
Glucose, UA: NEGATIVE
Ketones, UA: NEGATIVE
Leukocytes,UA: NEGATIVE
Nitrite, UA: NEGATIVE
Protein,UA: NEGATIVE
RBC, UA: NEGATIVE
Specific Gravity, UA: <=1.005 — AB (ref 1.005–1.030)
Urobilinogen, Ur: 0.2 mg/dL (ref 0.2–1.0)
pH, UA: 7.5 (ref 5.0–7.5)

## 2020-08-12 LAB — TSH+FREE T4
Free T4: 1.66 ng/dL (ref 0.82–1.77)
TSH: 0.848 u[IU]/mL (ref 0.450–4.500)

## 2020-08-12 LAB — APTT: aPTT: 29 s (ref 24–33)

## 2020-08-12 LAB — HEMOGLOBIN A1C
Est. average glucose Bld gHb Est-mCnc: 114 mg/dL
Hgb A1c MFr Bld: 5.6 % (ref 4.8–5.6)

## 2020-08-12 LAB — PROTIME-INR
INR: 1 (ref 0.9–1.2)
Prothrombin Time: 10.1 s (ref 9.1–12.0)

## 2020-08-12 NOTE — Telephone Encounter (Signed)
Pt aware of neg MyRisk results. IBIS=15.1%/riskscore=6.7%. No increased screening recommendations. Has mammo 08/17/20.   Patient understands these results only apply to her and her children, and this is not indicative of genetic testing results of her other family members. It is recommended that her other family members have genetic testing done.  Pt also understands negative genetic testing doesn't mean she will never get any of these cancers.   Hard copy mailed to pt. F/u prn.

## 2020-08-12 NOTE — Telephone Encounter (Signed)
Would encourage increase in water intake along with electrolytes.  Is she still having symptoms?  What symptoms are the most worrisome for her today?

## 2020-08-12 NOTE — Telephone Encounter (Signed)
Copied from [place] [phone]. Topic: General - Other >> Aug 12, 2020  9:08 AM Yvette Rack wrote: Reason for CRM: Pt stated she had labs done and now she would like to know what is the next step. Pt requests call back.

## 2020-08-12 NOTE — Telephone Encounter (Signed)
Pt called back to report that her back hurts severely. It feels a little better this evening, earlier today she was unable to put on shoes or bend over at all.

## 2020-08-12 NOTE — Telephone Encounter (Signed)
Unable to reach the patient

## 2020-08-13 ENCOUNTER — Encounter: Payer: Self-pay | Admitting: Family Medicine

## 2020-08-13 ENCOUNTER — Other Ambulatory Visit: Payer: Self-pay | Admitting: Family Medicine

## 2020-08-13 DIAGNOSIS — T148XXA Other injury of unspecified body region, initial encounter: Secondary | ICD-10-CM

## 2020-08-13 LAB — URINE CULTURE: Organism ID, Bacteria: NO GROWTH

## 2020-08-13 MED ORDER — CYCLOBENZAPRINE HCL 5 MG PO TABS: 5.0000 mg | ORAL_TABLET | Freq: Three times a day (TID) | ORAL | 1 refills | Status: DC | PRN

## 2020-08-13 NOTE — Telephone Encounter (Signed)
Severe pain needs to have a follow up visit - her initial lab work up with [place] in negative.  Would encourage the ER for imaging/diagnostic testing if continues

## 2020-08-17 ENCOUNTER — Ambulatory Visit
Admission: RE | Admit: 2020-08-17 | Discharge: 2020-08-17 | Disposition: A | Discharge status: Home / Self Care | Payer: PRIVATE HEALTH INSURANCE | Source: Ambulatory Visit | Attending: Family Medicine | Admitting: Family Medicine

## 2020-08-17 ENCOUNTER — Other Ambulatory Visit: Payer: Self-pay

## 2020-08-17 DIAGNOSIS — N6310 Unspecified lump in the right breast, unspecified quadrant: Secondary | ICD-10-CM | POA: Insufficient documentation

## 2020-10-10 ENCOUNTER — Other Ambulatory Visit: Payer: Self-pay

## 2020-10-10 ENCOUNTER — Emergency Department
Admission: EM | Admit: 2020-10-10 | Discharge: 2020-10-10 | Disposition: A | Discharge status: Home / Self Care | Payer: PRIVATE HEALTH INSURANCE | Attending: Emergency Medicine | Admitting: Emergency Medicine

## 2020-10-10 ENCOUNTER — Encounter: Payer: Self-pay | Admitting: Emergency Medicine

## 2020-10-10 DIAGNOSIS — R55 Syncope and collapse: Secondary | ICD-10-CM | POA: Diagnosis not present

## 2020-10-10 DIAGNOSIS — E039 Hypothyroidism, unspecified: Secondary | ICD-10-CM | POA: Insufficient documentation

## 2020-10-10 DIAGNOSIS — Z91040 Latex allergy status: Secondary | ICD-10-CM | POA: Insufficient documentation

## 2020-10-10 DIAGNOSIS — Z85850 Personal history of malignant neoplasm of thyroid: Secondary | ICD-10-CM | POA: Insufficient documentation

## 2020-10-10 LAB — URINALYSIS, COMPLETE (UACMP) WITH MICROSCOPIC
Bacteria, UA: NONE SEEN
Bilirubin Urine: NEGATIVE
Glucose, UA: NEGATIVE mg/dL
Hgb urine dipstick: NEGATIVE
Ketones, ur: NEGATIVE mg/dL
Leukocytes,Ua: NEGATIVE
Nitrite: NEGATIVE
Protein, ur: NEGATIVE mg/dL
Specific Gravity, Urine: 1.017 (ref 1.005–1.030)
pH: 6 (ref 5.0–8.0)

## 2020-10-10 LAB — CBC
HCT: 40.5 % (ref 36.0–46.0)
Hemoglobin: 13.6 g/dL (ref 12.0–15.0)
MCH: 30.8 pg (ref 26.0–34.0)
MCHC: 33.6 g/dL (ref 30.0–36.0)
MCV: 91.8 fL (ref 80.0–100.0)
Platelets: 247 10*3/uL (ref 150–400)
RBC: 4.41 MIL/uL (ref 3.87–5.11)
RDW: 11.9 % (ref 11.5–15.5)
WBC: 10.8 10*3/uL — ABNORMAL HIGH (ref 4.0–10.5)
nRBC: 0 % (ref 0.0–0.2)

## 2020-10-10 LAB — BASIC METABOLIC PANEL
Anion gap: 9 (ref 5–15)
BUN: 17 mg/dL (ref 6–20)
CO2: 27 mmol/L (ref 22–32)
Calcium: 8.9 mg/dL (ref 8.9–10.3)
Chloride: 105 mmol/L (ref 98–111)
Creatinine, Ser: 0.84 mg/dL (ref 0.44–1.00)
GFR, Estimated: >60 mL/min (ref >60)
Glucose, Bld: 182 mg/dL — ABNORMAL HIGH (ref 70–99)
Potassium: 3.9 mmol/L (ref 3.5–5.1)
Sodium: 141 mmol/L (ref 135–145)

## 2020-10-10 LAB — TROPONIN I (HIGH SENSITIVITY): Troponin I (High Sensitivity): 3 ng/L (ref <18)

## 2020-10-10 LAB — FIBRIN DERIVATIVES D-DIMER (ARMC ONLY): Fibrin derivatives D-dimer (ARMC): 496.4 ng/mL (FEU) (ref 0.00–499.00)

## 2020-10-10 NOTE — ED Notes (Signed)
Pt reports that today while showering she began to feel very tired. Pt reports that she did come other tasks for a few minutes, then sat down to have a BM. Patient reports that her BM turned to diarrhea, which correlated with increasing tiredness/dizziness. Pt reports she called out for her husband, who stayed with her while she briefly lost consciousness. Pt denies injury or fall resulting from the LOC. Reports episode as very brief, less than a minute.   Pt denies pain, fevers, n/v/d, dizziness, does affirm some continued tiredness. Pt a&o x4

## 2020-10-10 NOTE — Discharge Instructions (Signed)
As we discussed, your passout episode was likely a more benign cause of passing out such as a vasovagal syncopal episode.  You have no evidence of blood clot in the lungs, or pulmonary embolus.  Your work-up was largely unremarkable and looked good.  As we discussed, continue your normal care at home and if you feel the sensation coming on, please sit safely on the ground as quickly as you can.  If develop any further episodes of passing out, particularly in combination with headache or chest pain, fever, please return to the ED.

## 2020-10-10 NOTE — ED Triage Notes (Signed)
Pt arrived via POV with reports of syncopal episode while sitting on the toilet. Pt states while she was showering she felt tired and states she tried to get herself ready, pt reports she sat on the toilet to try to have a BM and felt like she was going to pass out.  PT states she called her husband in and then passed out. Denies hitting her head.  PT also states when she eating breakfast she had some chest pressure and states she has hx of hiatal hernia as well.  Denies any pain at this time.

## 2020-10-10 NOTE — ED Provider Notes (Signed)
[facility] Emergency Department Provider Note ____________________________________________   First MD Initiated Contact with Patient 10/10/20 1623     (approximate)  I have reviewed the triage vital signs and the nursing notes.  HISTORY  Chief Complaint Loss of Consciousness   HPI Jeanne Velez is a 59 y.o. femalewho presents to the ED for evaluation of loss of cons  Chart review indicates history of thyroid cancer s/p thyroidectomy and now on levothyroxine.  Seasonal allergies and minimal history otherwise.  Patient presents to the ED after a syncopal episode that occurred at home today.  Husband presents with her and provides additional history.  Patient reports being in her typical state of health until feeling very tired, "spacey," and dizzy while in the shower this morning.  She reports getting out of shower and sitting on the toilet to pass a bowel movement, straining, and then feeling worse.  She reports calling her husband, who indicates that he found her "spaced out" and then she syncopized in front of him.  He denies any seizure-like activity, tongue biting or noticed incontinence.  He reports that she came to within 1 minutes and has been normal since without a postictal period.  Patient reports she feels fine now.  She does indicate that prior to her syncopal episode she felt a pressure sensation to her epigastrium that felt "a little bit different" from her typical GERD.  Denies any discrete substernal chest pain or pressure, denies preceding headache or head trauma.  Past Medical History:  Diagnosis Date  . Allergy   . BRCA negative 07/2020   MyRisk neg  . Cataracts, bilateral   . Colon polyp   . Family history of breast cancer 07/2020   IBIS=15.1%/riskscore=6.7%  . Hyperlipidemia   . Macular degeneration   . Retinal vasculitis   . Thyroid cancer [facility])     Patient Active Problem List   Diagnosis Date Noted  . Dark urine 08/11/2020  .  Increased thirst 08/11/2020  . Vasomotor symptoms due to menopause 07/28/2020  . Encounter to establish care with new doctor 07/24/2020  . Needs flu shot 07/24/2020  . Lump of right breast 07/24/2020  . 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 Hypothyroidism 07/24/2020  . Mixed hyperlipidemia 07/24/2020  . Family history of breast cancer 07/24/2020  . 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 Arthritis 07/24/2020  . History of thyroid cancer 07/24/2020    Past Surgical History:  Procedure Laterality Date  . BREAST BIOPSY    . ovarial cystectomy    . THYROIDECTOMY    . TOTAL ABDOMINAL HYSTERECTOMY W/ BILATERAL SALPINGOOPHORECTOMY     dermoids, ovar cysts  . TUBAL LIGATION      Prior to Admission medications   Medication Sig Start Date End Date Taking? Authorizing Provider  acetaminophen (TYLENOL) 325 MG tablet Take 650 mg by mouth every 6 (six) hours as needed.    [provider]  calcium-vitamin D (OSCAL WITH D) 500-200 MG-UNIT tablet Take 1 tablet by mouth.    [provider]  cyclobenzaprine (FLEXERIL) 5 MG tablet Take 1 tablet (5 mg total) by mouth 3 (three) times daily as needed for muscle spasms. 08/13/20   Malfi, Lupita Raider, FNP  estradiol (ESTRACE) 0.5 MG tablet Take 1 tablet (0.5 mg total) by mouth daily. Patient taking differently: Take 0.5 mg by mouth 3 (three) times a week.  3/90/30   Copland, Deirdre Evener, PA-C  levothyroxine (SYNTHROID) 125 MCG tablet Take 125 mcg by mouth daily before  breakfast.    [provider]  loratadine (CLARITIN) 10 MG tablet Take 10 mg by mouth daily.    [provider]  Multiple Vitamins-Minerals (PRESERVISION AREDS 2 PO) Take by mouth.    [provider]  Probiotic Product (PROBIOTIC-10 PO) Take by mouth.    [provider]  valACYclovir (VALTREX) 1000 MG tablet Take 1,000 mg by mouth 2 (two) times daily as needed.     [provider]    Allergies Adhesive [tape], Cats claw (uncaria tomentosa),  Ginger, Latex, Penicillin g, Penicillins, and Wound dressing adhesive  Family History  Problem Relation Age of Onset  . Heart disease Father   . Depression Father   . Skin cancer Father 33  . Breast cancer Maternal Grandmother 42  . Skin cancer Maternal Grandmother 24  . Breast cancer Cousin 61  . Bone cancer Maternal Aunt 60  . Melanoma Maternal Aunt 22    Social History Social History   Tobacco Use  . Smoking status: Never Smoker  . Smokeless tobacco: Never Used  Vaping Use  . Vaping Use: Never used  Substance Use Topics  . Alcohol use: Yes  . Drug use: Never    Review of Systems  Constitutional: No fever/chills Eyes: No visual changes. ENT: No sore throat. Cardiovascular:  Positive for chest/epigastric discomfort. Respiratory: Denies shortness of breath. Gastrointestinal: No abdominal pain.  No nausea, no vomiting.  No diarrhea.  No constipation. Genitourinary: Negative for dysuria. Musculoskeletal: Negative for back pain. Skin: Negative for rash. Neurological: Negative for headaches, focal weakness or numbness.  ____________________________________________   PHYSICAL EXAM:  VITAL SIGNS: Vitals:   10/10/20 1730 10/10/20 1919  BP: 128/78 128/78  Pulse: 79 79  Resp:  18  Temp:  98.5 F (36.9 C)  SpO2: 100% 100%     Constitutional: Alert and oriented. Well appearing and in no acute distress. Eyes: Conjunctivae are normal. PERRL. EOMI. Head: Atraumatic. Nose: No congestion/rhinnorhea. Mouth/Throat: Mucous membranes are moist.  Oropharynx non-erythematous. Neck: No stridor. No cervical spine tenderness to palpation. Cardiovascular: Normal rate, regular rhythm. Grossly normal heart sounds.  Good peripheral circulation. Respiratory: Normal respiratory effort.  No retractions. Lungs CTAB. Gastrointestinal: Soft , nondistended, nontender to palpation. No CVA tenderness. Musculoskeletal: No lower extremity tenderness nor edema.  No joint effusions. No signs  of acute trauma. Neurologic:  Normal speech and language. No gross focal neurologic deficits are appreciated. No gait instability noted. Cranial nerves II through XII intact 5/5 strength and sensation in all 4 extremities Skin:  Skin is warm, dry and intact. No rash noted. Psychiatric: Mood and affect are normal. Speech and behavior are normal.  ____________________________________________   LABS (all labs ordered are listed, but only abnormal results are displayed)  Labs Reviewed  BASIC METABOLIC PANEL - Abnormal; Notable for the following components:      Result Value   Glucose, Bld 182 (*)    All other components within normal limits  CBC - Abnormal; Notable for the following components:   WBC 10.8 (*)    All other components within normal limits  URINALYSIS, COMPLETE (UACMP) WITH MICROSCOPIC - Abnormal; Notable for the following components:   Color, Urine YELLOW (*)    APPearance HAZY (*)    All other components within normal limits  FIBRIN DERIVATIVES D-DIMER ([facility] ONLY)  TROPONIN I (HIGH SENSITIVITY)  TROPONIN I (HIGH SENSITIVITY)   ____________________________________________  12 Lead EKG  Sinus rhythm, rate of 84 bpm.  Normal axis and intervals.  No evidence  of acute ischemia. ____________________________________________   PROCEDURES and INTERVENTIONS  Procedure(s) performed (including Critical Care):  .1-3 Lead EKG Interpretation Performed by: Vladimir Crofts, MD Authorized by: Vladimir Crofts, MD     Interpretation: normal     ECG rate:  74   ECG rate assessment: normal     Ectopy: none     Conduction: normal      Medications - No data to display  ____________________________________________   MDM / ED COURSE   59 year old woman presents to the ED after syncopal episode, most consistent with a vasovagal episode and amenable to outpatient management.  Normal vitals on room air without tachycardia or hypoxia.  Exam reassuring without evidence of acute  derangements, distress, neurovascular deficits or signs of trauma.  Considering her history of transitioning from hot shower to straining on the toilet prior to her syncopal episode, and her benign work-up today, is most consistent with a vasovagal episode.  D-dimer is negative, effectively ruling out acute PE considering her epigastric discomfort prior to the syncopal episode.  She otherwise has no evidence of acute derangements, endorgan damage or neurologic deficits.  She is asymptomatic here in the ED, observed for multiple hours without further episodes or acute abnormalities.  We discussed return precautions for the ED and following up with her PCP.  Patient medically stable for discharge home.  Clinical Course as of Oct 10 1921  Sat Oct 10, 2020  1901 Reassessed.  Patient remains asymptomatic.  I educate her reassuring D-dimer and work-up overall.  We discussed vasovagal syncope and outpatient management.  We discussed return precautions for the ED.   [DS]    Clinical Course User Index [DS] Vladimir Crofts, MD    ____________________________________________   FINAL CLINICAL IMPRESSION(S) / ED DIAGNOSES  Final diagnoses:  Vasovagal syncope     ED Discharge Orders    None       Jayelle Page Tamala Julian   Note:  This document was prepared using Dragon voice recognition software and may include unintentional dictation errors.   Vladimir Crofts, MD 10/10/20 1925

## 2020-10-13 ENCOUNTER — Other Ambulatory Visit: Payer: Self-pay | Admitting: Otolaryngology

## 2020-10-15 LAB — SURGICAL PATHOLOGY

## 2020-10-16 ENCOUNTER — Ambulatory Visit: Payer: 59 | Admitting: Family Medicine

## 2020-10-16 ENCOUNTER — Other Ambulatory Visit: Payer: Self-pay

## 2020-10-16 ENCOUNTER — Encounter: Payer: Self-pay | Admitting: Family Medicine

## 2020-10-16 VITALS — BP 123/71 | HR 73 | Temp 98.4°F | Resp 17 | Ht 68.0 in | Wt 222.4 lb

## 2020-10-16 DIAGNOSIS — L709 Acne, unspecified: Secondary | ICD-10-CM | POA: Diagnosis not present

## 2020-10-16 DIAGNOSIS — B354 Tinea corporis: Secondary | ICD-10-CM | POA: Insufficient documentation

## 2020-10-16 MED ORDER — CLINDAMYCIN PHOSPHATE 1 % EX GEL: 1.0000 "application " | Freq: Every day | CUTANEOUS | 0 refills | Status: DC

## 2020-10-16 MED ORDER — KETOCONAZOLE 2 % EX CREA: 1.0000 "application " | TOPICAL_CREAM | Freq: Every day | CUTANEOUS | 5 refills | Status: DC | PRN

## 2020-10-16 MED ORDER — CLINDAMYCIN PHOSPHATE 1 % EX GEL: 1.0000 "application " | Freq: Every day | CUTANEOUS | 5 refills | Status: DC

## 2020-10-16 NOTE — Progress Notes (Signed)
Subjective:    Patient ID: Jeanne Velez, female    DOB: 1961-08-10, 59 y.o.   MRN: [id number]  Jeanne Velez is a 59 y.o. female presenting on 10/16/2020 for Acne   HPI  Jeanne Velez presents to clinic for a medication refill on her prescription acne topical gel.  Reports has previously been prescribed clindamycin gel from her previous PCP.  Denies any other acute concerns.  Depression screen PHQ 2/9 07/24/2020  Decreased Interest 0  Down, Depressed, Hopeless 0  PHQ - 2 Score 0    Social History   Tobacco Use  . Smoking status: Never Smoker  . Smokeless tobacco: Never Used  Vaping Use  . Vaping Use: Never used  Substance Use Topics  . Alcohol use: Yes  . Drug use: Never    Review of Systems  Constitutional: Negative.   HENT: Negative.   Eyes: Negative.   Respiratory: Negative.   Cardiovascular: Negative.   Gastrointestinal: Negative.   Endocrine: Negative.   Genitourinary: Negative.   Musculoskeletal: Negative.   Skin: Negative.        Acne  Allergic/Immunologic: Negative.   Neurological: Negative.   Hematological: Negative.   Psychiatric/Behavioral: Negative.    Per HPI unless specifically indicated above     Objective:    BP 123/71 (BP Location: Right Arm, Patient Position: Sitting, Cuff Size: Large)   Pulse 73   Temp 98.4 F (36.9 C) (Oral)   Resp 17   Ht 5\' 8"  (1.727 m)   Wt 222 lb 6.4 oz (100.9 kg)   SpO2 100%   BMI 33.82 kg/m   Wt Readings from Last 3 Encounters:  10/16/20 222 lb 6.4 oz (100.9 kg)  10/10/20 215 lb (97.5 kg)  07/28/20 216 lb (98 kg)    Physical Exam Vitals and nursing note reviewed.  Constitutional:      General: She is not in acute distress.    Appearance: Normal appearance. She is well-developed and well-groomed. She is not ill-appearing or toxic-appearing.  HENT:     Head: Normocephalic and atraumatic.     Nose:     Comments: Lizbeth Bark is in place, covering mouth and nose. Eyes:     General: Lids are normal. Vision  grossly intact.        Right eye: No discharge.        Left eye: No discharge.     Extraocular Movements: Extraocular movements intact.     Conjunctiva/sclera: Conjunctivae normal.     Pupils: Pupils are equal, round, and reactive to light.  Cardiovascular:     Rate and Rhythm: Normal rate and regular rhythm.     Pulses: Normal pulses.     Heart sounds: Normal heart sounds. No murmur heard.  No friction rub. No gallop.   Pulmonary:     Effort: Pulmonary effort is normal. No respiratory distress.     Breath sounds: Normal breath sounds.  Skin:    General: Skin is warm and dry.     Capillary Refill: Capillary refill takes less than 2 seconds.  Neurological:     General: No focal deficit present.     Mental Status: She is alert and oriented to person, place, and time.  Psychiatric:        Attention and Perception: Attention and perception normal.        Mood and Affect: Mood and affect normal.        Speech: Speech normal.        Behavior: Behavior normal. Behavior  is cooperative.        Thought Content: Thought content normal.        Cognition and Memory: Cognition and memory normal.        Judgment: Judgment normal.    Results for orders placed or performed in visit on 10/13/20  Surgical pathology  Result Value Ref Range   SURGICAL PATHOLOGY      Surgical Pathology CASE: [id number] PATIENT: Jeralene Peters Surgical Pathology Report     Specimen Submitted: A. Left soft palate  Clinical History: D10.30    DIAGNOSIS: A. MUCOSA, LEFT SOFT PALATE; EXCISION: - BENIGN SQUAMOUS PAPILLOMA. - NEGATIVE FOR DYSPLASIA AND MALIGNANCY.  GROSS DESCRIPTION: A. Labeled: Left soft palate Received: In formalin Tissue fragment(s): 1 Size: 0.9 x 0.6 x 0.4 cm Description: A pedunculated piece of pale-tan polypoid and granular tissue, probable base inked, bisected.  Polypoid fragment portion fragments upon processing Entirely submitted in 1 cassette.   Final Diagnosis  performed by Allena Napoleon, MD.   Electronically signed 10/15/2020 9:30:36AM The electronic signature indicates that the named Attending Pathologist has evaluated the specimen Technical component performed at [facility], [address], [place], [place] [address] Lab: [phone] Dir: Rush Farmer, MD, MMM  Professional component performed at [facility], [facility], [place], [place], [place] [address] Lab: [phone] Dir: Dellia Nims. Reuel Derby, MD       Assessment & Plan:   Problem List Items Addressed This Visit      Musculoskeletal and Integument   Acne - Primary    Has previously been prescribed clindamycin gel, to apply daily, requesting refill.  Will resend to pharmacy on file.      Relevant Medications   clindamycin (CLINDAGEL) 1 % gel      Meds ordered this encounter  Medications  . DISCONTD: clindamycin (CLINDAGEL) 1 % gel    Sig: Apply 1 application topically daily.    Dispense:  30 g    Refill:  0  . ketoconazole (NIZORAL) 2 % cream    Sig: Apply 1 application topically daily as needed for irritation.    Dispense:  15 g    Refill:  5  . clindamycin (CLINDAGEL) 1 % gel    Sig: Apply 1 application topically daily.    Dispense:  30 g    Refill:  5   Follow up plan: Return in about 6 months (around 04/16/2021).   Harlin Rain, [place] [facility] Nurse Practition[facility] 10/16/2020, 12:30 PM

## 2020-10-16 NOTE — Assessment & Plan Note (Signed)
Has previously been prescribed clindamycin gel, to apply daily, requesting refill.  Will resend to pharmacy on file.

## 2020-10-16 NOTE — Patient Instructions (Signed)
Continue all prescriptions as directed  May find some benefit with using a squatty potty to help with bowel movements to prevent putting additional pressure on your vagus nerve  We will plan to see you back in 6 months for follow up on your prescriptions  You will receive a survey after today's visit either digitally by e-mail or paper by Kyle mail. Your experiences and feedback matter to [place].  Please respond so we know how we are doing as we provide care for you.  Call us with any questions/concerns/needs.  It is my goal to be available to you for your health concerns.  Thanks for choosing me to be a partner in your healthcare needs!  Harlin Rain, FNP-C Family Nurse Practitioner [facility] Phone: [phone]

## 2020-11-25 ENCOUNTER — Other Ambulatory Visit: Payer: Self-pay

## 2020-11-25 ENCOUNTER — Encounter: Payer: Self-pay | Admitting: Dermatology

## 2020-11-25 ENCOUNTER — Ambulatory Visit: Payer: 59 | Admitting: Dermatology

## 2020-11-25 DIAGNOSIS — L739 Follicular disorder, unspecified: Secondary | ICD-10-CM

## 2020-11-25 DIAGNOSIS — D2372 Other benign neoplasm of skin of left lower limb, including hip: Secondary | ICD-10-CM

## 2020-11-25 DIAGNOSIS — B078 Other viral warts: Secondary | ICD-10-CM

## 2020-11-25 DIAGNOSIS — D1800 Hemangioma unspecified site: Secondary | ICD-10-CM

## 2020-11-25 DIAGNOSIS — L219 Seborrheic dermatitis, unspecified: Secondary | ICD-10-CM | POA: Diagnosis not present

## 2020-11-25 DIAGNOSIS — D239 Other benign neoplasm of skin, unspecified: Secondary | ICD-10-CM

## 2020-11-25 DIAGNOSIS — L609 Nail disorder, unspecified: Secondary | ICD-10-CM

## 2020-11-25 DIAGNOSIS — L814 Other melanin hyperpigmentation: Secondary | ICD-10-CM

## 2020-11-25 DIAGNOSIS — L821 Other seborrheic keratosis: Secondary | ICD-10-CM

## 2020-11-25 DIAGNOSIS — L578 Other skin changes due to chronic exposure to nonionizing radiation: Secondary | ICD-10-CM

## 2020-11-25 DIAGNOSIS — L700 Acne vulgaris: Secondary | ICD-10-CM | POA: Diagnosis not present

## 2020-11-25 DIAGNOSIS — D229 Melanocytic nevi, unspecified: Secondary | ICD-10-CM

## 2020-11-25 DIAGNOSIS — Z1283 Encounter for screening for malignant neoplasm of skin: Secondary | ICD-10-CM

## 2020-11-25 MED ORDER — KETOCONAZOLE 2 % EX SHAM: MEDICATED_SHAMPOO | CUTANEOUS | 1 refills | Status: DC

## 2020-11-25 MED ORDER — DOXYCYCLINE HYCLATE 20 MG PO TABS: 20.0000 mg | ORAL_TABLET | Freq: Two times a day (BID) | ORAL | 1 refills | Status: AC

## 2020-11-25 MED ORDER — TRIAMCINOLONE ACETONIDE 0.1 % EX LOTN: TOPICAL_LOTION | CUTANEOUS | 0 refills | Status: DC

## 2020-11-25 MED ORDER — CLINDAMYCIN PHOSPHATE 1 % EX LOTN: TOPICAL_LOTION | Freq: Two times a day (BID) | CUTANEOUS | 0 refills | Status: DC

## 2020-11-25 MED ORDER — TRETINOIN 0.025 % EX CREA: TOPICAL_CREAM | Freq: Every day | CUTANEOUS | 0 refills | Status: DC

## 2020-11-25 NOTE — Patient Instructions (Addendum)
Melanoma ABCDEs  Melanoma is the most dangerous type of skin cancer, and is the leading cause of death from skin disease.  You are more likely to develop melanoma if you:  Have light-colored skin, light-colored eyes, or red or blond hair  Spend a lot of time in the sun  Tan regularly, either outdoors or in a tanning bed  Have had blistering sunburns, especially during childhood  Have a close family member who has had a melanoma  Have atypical moles or large birthmarks  Early detection of melanoma is key since treatment is typically straightforward and cure rates are extremely high if we catch it early.   The first sign of melanoma is often a change in a mole or a new dark spot.  The ABCDE system is a way of remembering the signs of melanoma.  A for asymmetry:  The two halves do not match. B for border:  The edges of the growth are irregular. C for color:  A mixture of colors are present instead of an even brown color. D for diameter:  Melanomas are usually (but not always) greater than 51mm - the size of a pencil eraser. E for evolution:  The spot keeps changing in size, shape, and color.  Please check your skin once per month between visits. You can use a small mirror in front and a large mirror behind you to keep an eye on the back side or your body.   If you see any new or changing lesions before your next follow-up, please call to schedule a visit.  Please continue daily skin protection including broad spectrum sunscreen SPF 30+ to sun-exposed areas, reapplying every 2 hours as needed when you're outdoors.   Topical retinoid medications like tretinoin/Retin-A, adapalene/Differin, tazarotene/Fabior, and Epiduo/Epiduo Forte can cause dryness and irritation when first started. Only apply a pea-sized amount to the entire affected area. Avoid applying it around the eyes, edges of mouth and creases at the nose. If you experience irritation, use a good moisturizer first and/or apply the  medicine less often. If you are doing well with the medicine, you can increase how often you use it until you are applying every night. Be careful with sun protection while using this medication as it can make you sensitive to the sun. This medicine should not be used by pregnant women.   Doxycycline should be taken with food to prevent nausea. Do not lay down for 30 minutes after taking. Be cautious with sun exposure and use good sun protection while on this medication. Pregnant women should not take this medication.   Recommend taking Heliocare sun protection supplement daily in sunny weather for additional sun protection. For maximum protection on the sunniest days, you can take up to 2 capsules of regular Heliocare OR take 1 capsule of Heliocare Ultra. For prolonged exposure (such as a full day in the sun), you can repeat your dose of the supplement 4 hours after your first dose. Heliocare can be purchased at [facility] or at [email or web address].   Instructions for After In-Office Application of Cantharidin  1. This is a strong medicine; please follow ALL instructions.  2. Gently wash off with soap and water in four hours or sooner s directed by your physician.  3. **WARNING** this medicine can cause severe blistering, blood blisters, infection, and/or scarring if it is not washed off as directed.  4. Your progress will be rechecked in 1-2 months; call sooner if there are any questions or problems.  Cantharidin is a blistering agent that comes from a beetle.  It needs to be washed off in about 4 hours after application.  Although it is painless when applied in office, it may cause symptoms of mild pain and burning several hours later.  Treated areas will swell and turn red, and blisters may form.  Vaseline and a bandaid may be applied until wound has healed.  Once healed, the skin may remain temporarily discolored.  It can take weeks to months for pigmentation to return to normal.     Recommend [facility] hypochlorous spray to use at back and face. May use CLn acne wash to face instead if chooses.

## 2020-11-25 NOTE — Progress Notes (Addendum)
New Patient Visit  Subjective  Jeanne Velez is a 60 y.o. female who presents for the following: TBSE (New patient here today for TBSE and skin cancer screening. No personal history of skin cancer but there is a family history of atypical nevi. ).  Patient was using retin-a 0.08% and is now using clindamycin and tridesilon to treat acne which patient feels is controlled but continues to get painful, cystic bumps every 2-3 months. She has started using a salicylic acid wash and has noticed some peeling. Patient states she did treat with accutane when younger but could not tolerate due to labs being abnormal. Patient also has dry, itchy scalp with dandruff and has been using Head & Shoulders with no improvement. She has warts at hands that have not been treated and some yellowing at left big toenail.   The following portions of the chart were reviewed this encounter and updated as appropriate:   Tobacco  Allergies  Meds  Problems  Med Hx  Surg Hx  Fam Hx      Review of Systems:  No other skin or systemic complaints except as noted in HPI or Assessment and Plan.  Objective  Well appearing patient in no apparent distress; mood and affect are within normal limits.  A full examination was performed including scalp, head, eyes, ears, nose, lips, neck, chest, axillae, abdomen, back, buttocks, bilateral upper extremities, bilateral lower extremities, hands, feet, fingers, toes, fingernails, and toenails. All findings within normal limits unless otherwise noted below.  Objective  Face: Trace open comedones, few small inflammatory papules, resolving cysts at face Scattered scars at cheeks  Few inflammatory papules at chest and back  Objective  Scalp: Pink patches with greasy scale.   Objective  bilateral hands: Verrucous papules -- Discussed viral etiology and contagion.   Objective  Left leg, multiple: Firm pink/brown papulenodule with dimple sign.   Objective  Left great  toenail: Nail plate slightly yellow and thickened R/o onychomycosis  Objective  Mid Back: Scattered perifollicular inflammatory papules    Assessment & Plan  Acne vulgaris Face  Chronic condition with duration over one year. Condition is bothersome to patient. Currently flared.  Start tretinoin 0.025% cream to face as directed at bedtime.  Start Cln or hypochlorous spray wash daily. Continue clindamycin twice daily Start doxycycline 20mg  twice daily with food. May take as needed with flares if patient desires. #[id number]  Topical retinoid medications like tretinoin/Retin-A, adapalene/Differin, tazarotene/Fabior, and Epiduo/Epiduo Forte can cause dryness and irritation when first started. Only apply a pea-sized amount to the entire affected area. Avoid applying it around the eyes, edges of mouth and creases at the nose. If you experience irritation, use a good moisturizer first and/or apply the medicine less often. If you are doing well with the medicine, you can increase how often you use it until you are applying every night. Be careful with sun protection while using this medication as it can make you sensitive to the sun. This medicine should not be used by pregnant women.   Doxycycline should be taken with food to prevent nausea. Do not lay down for 30 minutes after taking. Be cautious with sun exposure and use good sun protection while on this medication. Pregnant women should not take this medication.    Ordered Medications: tretinoin (RETIN-A) 0.025 % cream clindamycin (CLEOCIN-T) 1 % lotion doxycycline (PERIOSTAT) 20 MG tablet  Seborrheic dermatitis Scalp  Chronic condition with expected duration over one year. Condition is bothersome to patient. Currently  flared.  Advised no cure, only control.  Start ketoconazole 2% shampoo three times weekly. Massage into scalp and let sit 10 minutes prior to washing out.  Start triamcinolone lotion to scalp twice daily as needed.    Topical steroids (such as triamcinolone, fluocinolone, fluocinonide, mometasone, clobetasol, halobetasol, betamethasone, hydrocortisone) can cause thinning and lightening of the skin if they are used for too long in the same area. Your physician has selected the right strength medicine for your problem and area affected on the body. Please use your medication only as directed by your physician to prevent side effects.    Ordered Medications: ketoconazole (NIZORAL) 2 % shampoo triamcinolone lotion (KENALOG) 0.1 %  Other viral warts bilateral hands  Discussed viral etiology and risk of spread.  Discussed multiple treatments may be required to clear warts.  Discussed possible post-treatment dyspigmentation and risk of recurrence.  Cantharidin is a blistering agent that comes from a beetle.  It needs to be washed off in about 4 hours after application.  Although it is painless when applied in office, it may cause symptoms of mild pain and burning several hours later.  Treated areas will swell and turn red, and blisters may form.  Vaseline and a bandaid may be applied until wound has healed.  Once healed, the skin may remain temporarily discolored.  It can take weeks to months for pigmentation to return to normal.   Destruction of lesion - bilateral hands  Destruction method: chemical removal   Informed consent: discussed and consent obtained   Timeout:  patient name, date of birth, surgical site, and procedure verified Chemical destruction method: cantharidin   Application time:  4 hours Procedure instructions: patient instructed to wash and dry area   Outcome: patient tolerated procedure well with no complications   Post-procedure details: wound care instructions given    Dermatofibroma Left leg, multiple  Benign-appearing.  Observation.  Call clinic for new or changing lesions.  Recommend daily use of broad spectrum spf 30+ sunscreen to sun-exposed areas.    Nail disorder Left great  toenail R/o onychomycosis Dystrophic nail trimmed and specimen sent for PAS  Specimen 1 - Nail clipping Differential Diagnosis: R/o onychomycosis  Check Margins: No Nail plate slightly yellow and thickened   Folliculitis Mid Back  Start hypochlorous spray daily Start doxycycline 20mg  twice daily with food.  Start TMC 0.1% lotion twice daily as needed for itch for up to 2 weeks.   Doxycycline should be taken with food to prevent nausea. Do not lay down for 30 minutes after taking. Be cautious with sun exposure and use good sun protection while on this medication. Pregnant women should not take this medication.   Topical steroids (such as triamcinolone, fluocinolone, fluocinonide, mometasone, clobetasol, halobetasol, betamethasone, hydrocortisone) can cause thinning and lightening of the skin if they are used for too long in the same area. Your physician has selected the right strength medicine for your problem and area affected on the body. Please use your medication only as directed by your physician to prevent side effects.    Ordered Medications: doxycycline (PERIOSTAT) 20 MG tablet triamcinolone lotion (KENALOG) 0.1 %   Lentigines - Scattered tan macules - Discussed due to sun exposure - Benign, observe - Call for any changes  Seborrheic Keratoses - Stuck-on, waxy, tan-brown papules and plaques  - Discussed benign etiology and prognosis. - Observe - Call for any changes  Melanocytic Nevi - Tan-brown and/or pink-flesh-colored symmetric macules and papules - Benign appearing on exam today -  Observation - Call clinic for new or changing moles - Recommend daily use of broad spectrum spf 30+ sunscreen to sun-exposed areas.   Hemangiomas - Red papules - Discussed benign nature - Observe - Call for any changes  Actinic Damage - Chronic, secondary to cumulative UV/sun exposure - diffuse scaly erythematous macules with underlying dyspigmentation - Recommend daily  broad spectrum sunscreen SPF 30+ to sun-exposed areas, reapply every 2 hours as needed.  - Call for new or changing lesions.  Skin cancer screening performed today.   Return in about 4 weeks (around 12/23/2020) for Acne, Wart.  Graciella Belton, RMA, am acting as scribe for Forest Gleason, MD .  Documentation: I have reviewed the above documentation for accuracy and completeness, and I agree with the above.  Forest Gleason, MD

## 2020-11-26 ENCOUNTER — Ambulatory Visit: Payer: 59 | Admitting: Dermatology

## 2020-11-26 ENCOUNTER — Encounter: Payer: Self-pay | Admitting: Dermatology

## 2020-12-01 NOTE — Progress Notes (Signed)
Toenail, left great toenail NAIL MATERIAL, NEGATIVE FOR FUNGUS  No toenail fungus seen. Favor changes due to trauma. No medical treatment recommended at this time.

## 2020-12-30 ENCOUNTER — Other Ambulatory Visit: Payer: Self-pay

## 2020-12-30 ENCOUNTER — Encounter: Payer: Self-pay | Admitting: Dermatology

## 2020-12-30 ENCOUNTER — Ambulatory Visit: Payer: 59 | Admitting: Dermatology

## 2020-12-30 DIAGNOSIS — L739 Follicular disorder, unspecified: Secondary | ICD-10-CM

## 2020-12-30 DIAGNOSIS — L219 Seborrheic dermatitis, unspecified: Secondary | ICD-10-CM

## 2020-12-30 DIAGNOSIS — B079 Viral wart, unspecified: Secondary | ICD-10-CM

## 2020-12-30 DIAGNOSIS — L299 Pruritus, unspecified: Secondary | ICD-10-CM

## 2020-12-30 DIAGNOSIS — L700 Acne vulgaris: Secondary | ICD-10-CM

## 2020-12-30 MED ORDER — ADAPALENE 0.3 % EX GEL: 1.0000 "application " | Freq: Every day | CUTANEOUS | 11 refills | Status: DC

## 2020-12-30 MED ORDER — KETOCONAZOLE 2 % EX SHAM: MEDICATED_SHAMPOO | CUTANEOUS | 11 refills | Status: DC

## 2020-12-30 MED ORDER — CLOBETASOL PROPIONATE 0.05 % EX SOLN: 1.0000 "application " | Freq: Two times a day (BID) | CUTANEOUS | 3 refills | Status: DC | PRN

## 2020-12-30 MED ORDER — DOXYCYCLINE HYCLATE 20 MG PO TABS: 20.0000 mg | ORAL_TABLET | Freq: Every day | ORAL | 3 refills | Status: DC

## 2020-12-30 NOTE — Patient Instructions (Addendum)
Doxycycline should be taken with food to prevent nausea. Do not lay down for 30 minutes after taking. Be cautious with sun exposure and use good sun protection while on this medication. Pregnant women should not take this medication.   Topical retinoid medications like tretinoin/Retin-A, adapalene/Differin, tazarotene/Fabior, and Epiduo/Epiduo Forte can cause dryness and irritation when first started. Only apply a pea-sized amount to the entire affected area. Avoid applying it around the eyes, edges of mouth and creases at the nose. If you experience irritation, use a good moisturizer first and/or apply the medicine less often. If you are doing well with the medicine, you can increase how often you use it until you are applying every night. Be careful with sun protection while using this medication as it can make you sensitive to the sun. This medicine should not be used by pregnant women.   Recommend taking Heliocare sun protection supplement daily in sunny weather for additional sun protection. For maximum protection on the sunniest days, you can take up to 2 capsules of regular Heliocare OR take 1 capsule of Heliocare Ultra. For prolonged exposure (such as a full day in the sun), you can repeat your dose of the supplement 4 hours after your first dose. Heliocare can be purchased at [facility] or at [email or web address].   Topical steroids (such as triamcinolone, fluocinolone, fluocinonide, mometasone, clobetasol, halobetasol, betamethasone, hydrocortisone) can cause thinning and lightening of the skin if they are used for too long in the same area. Your physician has selected the right strength medicine for your problem and area affected on the body. Please use your medication only as directed by your physician to prevent side effects.   For buttocks Recommend triple paste AF apply to affected areas twice daily as needed.

## 2020-12-30 NOTE — Progress Notes (Signed)
Follow-Up Visit   Subjective  Jeanne Velez is a 59 y.o. female who presents for the following: Acne (Patient here today for follow up on acne on face and wart on hands, folliculitis and seb derm on scalp. She states hands have cleared. Patient states her skin is less inflammed and feels looks better. She currently taking doxycycline, tretinoin, clindamycin for face. ).  The following portions of the chart were reviewed this encounter and updated as appropriate:  Tobacco  Allergies  Meds  Problems  Med Hx  Surg Hx  Fam Hx      Objective  Well appearing patient in no apparent distress; mood and affect are within normal limits.  A focused examination was performed including face, scalp, bilateral hands, back . Relevant physical exam findings are noted in the Assessment and Plan.  Objective  face: Trace open comedones, rare small inflammatory papules on face   Objective  Scalp: Erythematous patches   Objective  bilateral hands: Few Verrucous papules on hands    Objective  mid back: Perifollicular erythematous papules and pustules, improved   Assessment & Plan  Acne vulgaris face  Chronic condition with expected duration over one year. Currently well-controlled but getting irritation from tretinoin if used more than twice per week.  D/c tretinoin 0.025 % cream   Start adapalene 0.3 % gel apply daily at bedtimes to areas of face.  topical retinoid medications like tretinoin/Retin-A, adapalene/Differin, tazarotene/Fabior, and Epiduo/Epiduo Forte can cause dryness and irritation when first started. Only apply a pea-sized amount to the entire affected area. Avoid applying it around the eyes, edges of mouth and creases at the nose. If you experience irritation, use a good moisturizer first and/or apply the medicine less often. If you are doing well with the medicine, you can increase how often you use it until you are applying every night. Be careful with sun protection while  using this medication as it can make you sensitive to the sun. This medicine should not be used by pregnant women.   Continue doxycycline 20 mg tablet taking 1 tablet by mouth daily with food.  Doxycycline should be taken with food to prevent nausea. Do not lay down for 30 minutes after taking. Be cautious with sun exposure and use good sun protection while on this medication. Pregnant women should not take this medication.   If doing well, can try decreasing doxycycline to once per day and then stopping.  Continue hypochlorous acid spray daily  Ordered Medications: Adapalene (DIFFERIN) 0.3 % gel  Other Related Medications clindamycin (CLEOCIN-T) 1 % lotion  Seborrheic dermatitis Scalp  Chronic condition with expected duration over one year. Condition is bothersome to patient. Not currently at goal/still with significant itch.  Continue ketoconazole 2 % shampoo - massage into scalp and let sit 10 minutes before washing out. Use three times weekly.   D/c Triamcinolone lotion  Start Clobetasol 0.05 % external solution apply 1 application topically twice daily as needed. Avoid applying to face, groin, and axilla. Use as directed. Risk of skin atrophy with long-term use reviewed.   Topical steroids (such as triamcinolone, fluocinolone, fluocinonide, mometasone, clobetasol, halobetasol, betamethasone, hydrocortisone) can cause thinning and lightening of the skin if they are used for too long in the same area. Your physician has selected the right strength medicine for your problem and area affected on the body. Please use your medication only as directed by your physician to prevent side effects.    Ordered Medications: clobetasol (TEMOVATE) 0.05 % external  solution  Reordered Medications ketoconazole (NIZORAL) 2 % shampoo doxycycline (PERIOSTAT) 20 MG tablet  Viral warts, unspecified type bilateral hands  Persistent despite treatment with cantharone plus at last visit. Deferred in  office treatment today.  Start using Otc salicylic acid under duct tape nightly.     Folliculitis mid back  Continue doxycycline 20 mg tablet take 1 tablet by mouth daily with food. If doing well, try decreasing to once per day and then stopping if no flare.  Continue hypochlorous spray daily  Can use TMC 0.1% lotion twice daily as needed for itch for up to 2 weeks.    Doxycycline should be taken with food to prevent nausea. Do not lay down for 30 minutes after taking. Be cautious with sun exposure and use good sun protection while on this medication. Pregnant women should not take this medication.    Topical steroids (such as triamcinolone, fluocinolone, fluocinonide, mometasone, clobetasol, halobetasol, betamethasone, hydrocortisone) can cause thinning and lightening of the skin if they are used for too long in the same area. Your physician has selected the right strength medicine for your problem and area affected on the body. Please use your medication only as directed by your physician to prevent side effects.   Return for follow up as needed , 1 year tbse .  I, Ruthell Rummage, CMA, am acting as scribe for Forest Gleason, MD.  Documentation: I have reviewed the above documentation for accuracy and completeness, and I agree with the above.  Forest Gleason, MD

## 2021-01-12 ENCOUNTER — Other Ambulatory Visit: Payer: Self-pay

## 2021-01-12 MED ORDER — FLUCONAZOLE 150 MG PO TABS: 150.0000 mg | ORAL_TABLET | Freq: Once | ORAL | 0 refills | Status: AC

## 2021-01-30 ENCOUNTER — Encounter (INDEPENDENT_AMBULATORY_CARE_PROVIDER_SITE_OTHER): Payer: Self-pay

## 2021-02-16 ENCOUNTER — Ambulatory Visit (INDEPENDENT_AMBULATORY_CARE_PROVIDER_SITE_OTHER): Payer: 59 | Admitting: Unknown Physician Specialty

## 2021-02-16 ENCOUNTER — Other Ambulatory Visit: Payer: Self-pay

## 2021-02-16 ENCOUNTER — Encounter: Payer: Self-pay | Admitting: Unknown Physician Specialty

## 2021-02-16 VITALS — BP 128/73 | HR 75 | Temp 97.7°F | Resp 18 | Ht 68.0 in | Wt 223.4 lb

## 2021-02-16 DIAGNOSIS — E669 Obesity, unspecified: Secondary | ICD-10-CM | POA: Diagnosis not present

## 2021-02-16 DIAGNOSIS — M7750 Other enthesopathy of unspecified foot: Secondary | ICD-10-CM | POA: Diagnosis not present

## 2021-02-16 DIAGNOSIS — Z6833 Body mass index (BMI) 33.0-33.9, adult: Secondary | ICD-10-CM | POA: Diagnosis not present

## 2021-02-16 MED ORDER — CELECOXIB 200 MG PO CAPS: 200.0000 mg | ORAL_CAPSULE | Freq: Every day | ORAL | 0 refills | Status: DC

## 2021-02-16 NOTE — Addendum Note (Signed)
Addended by: Kathrine Haddock on: 02/16/2021 03:52 PM   Modules accepted: Level of Service

## 2021-02-16 NOTE — Progress Notes (Addendum)
BP 128/73 (BP Location: Left Arm, Patient Position: Sitting, Cuff Size: Large)   Pulse 75   Temp 97.7 F (36.5 C) (Temporal)   Resp 18   Ht 5\' 8"  (1.727 m)   Wt 223 lb 6.4 oz (101.3 kg)   SpO2 100%   BMI 33.97 kg/m    Subjective:    Patient ID: Jeanne Velez, female    DOB: 1961-06-01, 60 y.o.   MRN: [id number]  HPI: Jeanne Velez is a 60 y.o. female  Chief Complaint  Patient presents with  . Ankle Pain    Bilateral ankle swelling and pain. She notice the swelling after wearing work boots to do yard work. After removing the boots both ankles  where swollen and painful to stand. Pt state since wearing the boots she still having intermittent pain.   . Weight Check    Pt want to talk about weight management    Ankle Pain Patient states that she was gardening a couple of weeks ago while wearing her work boots. When the boots were removed, she noticed pain in her right ankle. She states that both of her ankles were swollen and seemed to be bruised in the ankle area. Patient says that the pain occurs with walking but can also randomly happen with sitting. She describes it as a sharp, shooting pain on lateral side of both ankles. Patient has tried icing the area, tylenol, ibuprofen, and stretches every morning. The issue is better than before but still bothering her.   Obesity Patient states she has tried many diet plans and she loses weight but always gains it back. She has tried weight watchers many times in the past as well. She tries to be careful about eating but does slip up at times. Until recent ankle issues, she walked at least 30 mins daily. Patient is interested in possible medication to help her weight loss efforts.    Relevant past medical, surgical, family and social history reviewed and updated as indicated. Interim medical history since our last visit reviewed. Allergies and medications reviewed and updated.  Review of Systems  Constitutional: Negative for fatigue and  fever.  Respiratory: Negative for cough and shortness of breath.   Cardiovascular: Negative for chest pain and palpitations.  Gastrointestinal: Negative.   Genitourinary: Negative.   Musculoskeletal: Positive for gait problem and myalgias.       Bilateral ankle pain.   Skin: Negative.   Neurological: Negative for dizziness and numbness.  Psychiatric/Behavioral: Negative.     Per HPI unless specifically indicated above     Objective:    BP 128/73 (BP Location: Left Arm, Patient Position: Sitting, Cuff Size: Large)   Pulse 75   Temp 97.7 F (36.5 C) (Temporal)   Resp 18   Ht 5\' 8"  (1.727 m)   Wt 223 lb 6.4 oz (101.3 kg)   SpO2 100%   BMI 33.97 kg/m   Wt Readings from Last 3 Encounters:  02/16/21 223 lb 6.4 oz (101.3 kg)  10/16/20 222 lb 6.4 oz (100.9 kg)  10/10/20 215 lb (97.5 kg)    Physical Exam Vitals reviewed.  Constitutional:      Appearance: She is obese.  HENT:     Head: Normocephalic and atraumatic.  Cardiovascular:     Rate and Rhythm: Normal rate and regular rhythm.     Pulses: Normal pulses.     Heart sounds: Normal heart sounds.  Pulmonary:     Effort: Pulmonary effort is normal.  Breath sounds: Normal breath sounds.  Abdominal:     Palpations: Abdomen is soft.  Musculoskeletal:        General: Tenderness present.     Right ankle: Tenderness present over the lateral malleolus. Decreased range of motion.     Left ankle: Tenderness present over the lateral malleolus. Decreased range of motion.  Skin:    General: Skin is warm and dry.     Capillary Refill: Capillary refill takes less than 2 seconds.  Neurological:     General: No focal deficit present.     Mental Status: She is alert and oriented to person, place, and time.  Psychiatric:        Mood and Affect: Mood normal.        Speech: Speech normal.        Behavior: Behavior normal.        Thought Content: Thought content normal.     Assessment & Plan:   Problem List Items Addressed  This Visit   None   Visit Diagnoses    Class 1 obesity without serious comorbidity with body mass index (BMI) of 33.0 to 33.9 in adult, unspecified obesity type    -  Primary   Due to history of thyroid cancer, GLP-1 for weight loss contraindicated. Discuss Contrave but patient would prefer to have referral to medical weight management   Relevant Orders   Amb Ref to Medical Weight Management   Tendonitis of ankle       Bilateral ankle pain that is resolving. Possible tendonitis. Ordered Celebrex 200 mg daily for pain and inflammation.       Follow up plan: Return Referral to Medical Weight Management.Marland Kitchen

## 2021-03-09 ENCOUNTER — Telehealth: Payer: Self-pay

## 2021-03-09 NOTE — Telephone Encounter (Signed)
Copied from [place] [id number]. Topic: Referral - Request for Referral >> Mar 08, 2021 10:10 AM Yvette Rack wrote: Has patient seen PCP for this complaint? No and per pt, her insurance does not require appt with pcp *If NO, is insurance requiring patient see PCP for this issue before PCP can refer them? Referral for which specialty: GI Preferred provider/office: [facility]  Reason for referral: Pt has discomfort after bowel movements - pt has had a colonoscopy 6 times since 2000

## 2021-03-16 ENCOUNTER — Encounter: Payer: Self-pay | Admitting: Internal Medicine

## 2021-03-16 ENCOUNTER — Other Ambulatory Visit: Payer: Self-pay

## 2021-03-16 ENCOUNTER — Ambulatory Visit (INDEPENDENT_AMBULATORY_CARE_PROVIDER_SITE_OTHER): Payer: 59 | Admitting: Internal Medicine

## 2021-03-16 VITALS — BP 143/70 | HR 87 | Temp 97.8°F | Resp 17 | Ht 68.0 in | Wt 225.0 lb

## 2021-03-16 DIAGNOSIS — E6609 Other obesity due to excess calories: Secondary | ICD-10-CM

## 2021-03-16 DIAGNOSIS — Z8719 Personal history of other diseases of the digestive system: Secondary | ICD-10-CM | POA: Diagnosis not present

## 2021-03-16 DIAGNOSIS — R198 Other specified symptoms and signs involving the digestive system and abdomen: Secondary | ICD-10-CM

## 2021-03-16 DIAGNOSIS — Z6834 Body mass index (BMI) 34.0-34.9, adult: Secondary | ICD-10-CM

## 2021-03-16 NOTE — Progress Notes (Signed)
Subjective:    Patient ID: Jeanne Velez, female    DOB: [birth year], 60 y.o.   MRN: [id number]  HPI  Pt presents to the clinic today with c/o rectal pressure.  She reports this started 2 weeks ago.  She reports this occurs multiple times a day with activity and while using the restroom.  She denies nausea, vomiting, constipation, diarrhea or blood in her stool.  She does not feel a bulging into the vagina when she needs to have a bowel movement.  She is taking a probiotic daily.  She has had multiple colonoscopies in the past in [place], she brings the report of her last 1 which will be scanned to the chart.  She would like referral to GI for further evaluation of her symptoms.  Review of Systems      Past Medical History:  Diagnosis Date  . Allergy   . BRCA negative 07/2020   MyRisk neg  . Cataracts, bilateral   . Colon polyp   . Family history of breast cancer 07/2020   IBIS=15.1%/riskscore=6.7%  . Hyperlipidemia   . Macular degeneration   . Retinal vasculitis   . Thyroid cancer [facility])     Current Outpatient Medications  Medication Sig Dispense Refill  . acetaminophen (TYLENOL) 325 MG tablet Take 650 mg by mouth every 6 (six) hours as needed.    . Adapalene (DIFFERIN) 0.3 % gel Apply 1 application topically at bedtime. 45 g 11  . calcium-vitamin D (OSCAL WITH D) 500-200 MG-UNIT tablet Take 1 tablet by mouth.    . celecoxib (CELEBREX) 200 MG capsule Take 1 capsule (200 mg total) by mouth daily. 20 capsule 0  . clindamycin (CLINDAGEL) 1 % gel Apply 1 application topically daily. 30 g 5  . doxycycline (PERIOSTAT) 20 MG tablet Take 1 tablet (20 mg total) by mouth daily. For acne. Take with food 30 tablet 3  . estradiol (ESTRACE) 0.5 MG tablet Take 1 tablet (0.5 mg total) by mouth daily. (Patient taking differently: Take 0.5 mg by mouth 3 (three) times a week.) 90 tablet 3  . ketoconazole (NIZORAL) 2 % cream Apply 1 application topically daily as needed for irritation. 15 g 5  .  ketoconazole (NIZORAL) 2 % shampoo Massage into scalp and let sit 10 minutes before washing out. Use three times weekly. 120 mL 11  . levothyroxine (SYNTHROID) 125 MCG tablet Take 125 mcg by mouth daily before breakfast.    . loratadine (CLARITIN) 10 MG tablet Take 10 mg by mouth daily.    . Multiple Vitamins-Minerals (PRESERVISION AREDS 2 PO) Take 1 capsule by mouth 2 (two) times daily.     . Probiotic Product (PROBIOTIC-10 PO) Take by mouth.    . valACYclovir (VALTREX) 1000 MG tablet Take 1,000 mg by mouth 2 (two) times daily as needed.      No current facility-administered medications for this visit.    Allergies  Allergen Reactions  . Adhesive [Tape]   . Cat Hair Extract   . Ginger Swelling  . Latex Itching  . Penicillin G Swelling  . Penicillins   . Wound Dressing Adhesive Itching and Swelling    Family History  Problem Relation Age of Onset  . Heart disease Father   . Depression Father   . Skin cancer Father 72  . Breast cancer Maternal Grandmother 71  . Skin cancer Maternal Grandmother 61  . Breast cancer Cousin 97  . Bone cancer Maternal Aunt 60  . Melanoma Maternal Aunt 60  Social History   Socioeconomic History  . Marital status: Married    Spouse name: Not on file  . Number of children: Not on file  . Years of education: Not on file  . Highest education level: Not on file  Occupational History  . Not on file  Tobacco Use  . Smoking status: Never Smoker  . Smokeless tobacco: Never Used  Vaping Use  . Vaping Use: Never used  Substance and Sexual Activity  . Alcohol use: Yes  . Drug use: Never  . Sexual activity: Yes    Birth control/protection: Surgical    Comment: Hysterectomy  Other Topics Concern  . Not on file  Social History Narrative  . Not on file   Social Determinants of Health   Financial Resource Strain: Not on file  Food Insecurity: Not on file  Transportation Needs: Not on file  Physical Activity: Not on file  Stress: Not on file   Social Connections: Not on file  Intimate Partner Violence: Not on file     Constitutional: Denies fever, malaise, fatigue, headache or abrupt weight changes.  Respiratory: Denies difficulty breathing, shortness of breath, cough or sputum production.   Cardiovascular: Denies chest pain, chest tightness, palpitations or swelling in the hands or feet.  Gastrointestinal: Pt reports rectal pressure. Denies abdominal pain, bloating, constipation, diarrhea or blood in the stool.  Skin: Denies redness, rashes, lesions or ulcercations.   No other specific complaints in a complete review of systems (except as listed in HPI above).  Objective:   Physical Exam  BP (!) 143/70 (BP Location: Left Arm, Patient Position: Sitting, Cuff Size: Normal)   Pulse 87   Temp 97.8 F (36.6 C) (Temporal)   Resp 17   Ht '5\' 8"'  (1.727 m)   Wt 225 lb (102.1 kg)   SpO2 100%   BMI 34.21 kg/m   Wt Readings from Last 3 Encounters:  02/16/21 223 lb 6.4 oz (101.3 kg)  10/16/20 222 lb 6.4 oz (100.9 kg)  10/10/20 215 lb (97.5 kg)    General: Appears her stated age, obese, in NAD. HEENT: Head: normal shape and size; Eyes: sclera white and EOMs intact;  Cardiovascular: Normal rate and rhythm. S1,S2 noted.  No murmur, rubs or gallops noted.  Pulmonary/Chest: Normal effort and positive vesicular breath sounds. No respiratory distress. No wheezes, rales or ronchi noted.  Rectal: External hemorrhoidal tag noted.  No external hemorrhoids noted.  Normal rectal tone.  No internal mass noted in the rectal vault. Abdomen: Soft and nontender. Normal bowel sounds. No distention or masses noted.  Neurological: Alert and oriented.    BMET    Component Value Date/Time   NA 141 10/10/2020 1331   NA 138 08/11/2020 1203   K 3.9 10/10/2020 1331   CL 105 10/10/2020 1331   CO2 27 10/10/2020 1331   GLUCOSE 182 (H) 10/10/2020 1331   BUN 17 10/10/2020 1331   BUN 13 08/11/2020 1203   CREATININE 0.84 10/10/2020 1331   CALCIUM  8.9 10/10/2020 1331   GFRNONAA >60 10/10/2020 1331   GFRAA 95 08/11/2020 1203    Lipid Panel     Component Value Date/Time   CHOL 189 07/28/2020 0914   TRIG 203 (H) 07/28/2020 0914   HDL 42 07/28/2020 0914   CHOLHDL 4.5 (H) 07/28/2020 0914   LDLCALC 112 (H) 07/28/2020 0914    CBC    Component Value Date/Time   WBC 10.8 (H) 10/10/2020 1331   RBC 4.41 10/10/2020 1331  HGB 13.6 10/10/2020 1331   HGB 13.6 08/11/2020 1203   HCT 40.5 10/10/2020 1331   HCT 40.9 08/11/2020 1203   PLT 247 10/10/2020 1331   PLT 284 08/11/2020 1203   MCV 91.8 10/10/2020 1331   MCV 92 08/11/2020 1203   MCH 30.8 10/10/2020 1331   MCHC 33.6 10/10/2020 1331   RDW 11.9 10/10/2020 1331   RDW 11.8 08/11/2020 1203   LYMPHSABS 2.5 08/11/2020 1203   EOSABS 0.2 08/11/2020 1203   BASOSABS 0.1 08/11/2020 1203    Hgb A1C Lab Results  Component Value Date   HGBA1C 5.6 08/11/2020          Assessment & Plan:   Rectal Pressure, History of Colonic Polyps:  Referral to GI for further evaluation and treatment Webb Silversmith, NP This visit occurred during the SARS-CoV-2 public health emergency.  Safety protocols were in place, including screening questions prior to the visit, additional usage of staff PPE, and extensive cleaning of exam room while observing appropriate contact time as indicated for disinfecting solutions.

## 2021-03-17 NOTE — Patient Instructions (Signed)
Colon Polyps  Colon polyps are tissue growths inside the colon, which is part of the large intestine. They are one of the types of polyps that can grow in the body. A polyp may be a round bump or a mushroom-shaped growth. You could have one polyp or more than one. Most colon polyps are noncancerous (benign). However, some colon polyps can become cancerous over time. Finding and removing the polyps early can help prevent this. What are the causes? The exact cause of colon polyps is not known. What increases the risk? The following factors may make you more likely to develop this condition:  Having a family history of colorectal cancer or colon polyps.  Being older than 58 years of age.  Being younger than 58 years of age and having a significant family history of colorectal cancer or colon polyps or a genetic condition that puts you at higher risk of getting colon polyps.  Having inflammatory bowel disease, such as ulcerative colitis or Crohn's disease.  Having certain conditions passed from parent to child (hereditary conditions), such as: ? Familial adenomatous polyposis (FAP). ? Lynch syndrome. ? Turcot syndrome. ? Peutz-Jeghers syndrome. ? MUTYH-associated polyposis (MAP).  Being overweight.  Certain lifestyle factors. These include smoking cigarettes, drinking too much alcohol, not getting enough exercise, and eating a diet that is high in fat and red meat and low in fiber.  Having had childhood cancer that was treated with radiation of the abdomen. What are the signs or symptoms? Many times, there are no symptoms. If you have symptoms, they may include:  Blood coming from the rectum during a bowel movement.  Blood in the stool (feces). The blood may be bright red or very dark in color.  Pain in the abdomen.  A change in bowel habits, such as constipation or diarrhea. How is this diagnosed? This condition is diagnosed with a colonoscopy. This is a procedure in which a  lighted, flexible scope is inserted into the opening between the buttocks (anus) and then passed into the colon to examine the area. Polyps are sometimes found when a colonoscopy is done as part of routine cancer screening tests. How is this treated? This condition is treated by removing any polyps that are found. Most polyps can be removed during a colonoscopy. Those polyps will then be tested for cancer. Additional treatment may be needed depending on the results of testing. Follow these instructions at home: Eating and drinking  Eat foods that are high in fiber, such as fruits, vegetables, and whole grains.  Eat foods that are high in calcium and vitamin D, such as milk, cheese, yogurt, eggs, liver, fish, and broccoli.  Limit foods that are high in fat, such as fried foods and desserts.  Limit the amount of red meat, precooked or cured meat, or other processed meat that you eat, such as hot dogs, sausages, bacon, or meat loaves.  Limit sugary drinks.   Lifestyle  Maintain a healthy weight, or lose weight if recommended by your health care provider.  Exercise every day or as told by your health care provider.  Do not use any products that contain nicotine or tobacco, such as cigarettes, e-cigarettes, and chewing tobacco. If you need help quitting, ask your health care provider.  Do not drink alcohol if: ? Your health care provider tells you not to drink. ? You are pregnant, may be pregnant, or are planning to become pregnant.  If you drink alcohol: ? Limit how much you use to:  0-1 drink a day for women.  0-2 drinks a day for men. ? Know how much alcohol is in your drink. In the [place] one drink equals one 12 oz bottle of beer (355 mL), one 5 oz glass of wine (148 mL), or one 1 oz glass of hard liquor (44 mL). General instructions  Take over-the-counter and prescription medicines only as told by your health care provider.  Keep all follow-up visits. This is important. This  includes having regularly scheduled colonoscopies. Talk to your health care provider about when you need a colonoscopy. Contact a health care provider if:  You have new or worsening bleeding during a bowel movement.  You have new or increased blood in your stool.  You have a change in bowel habits.  You lose weight for no known reason. Summary  Colon polyps are tissue growths inside the colon, which is part of the large intestine. They are one type of polyp that can grow in the body.  Most colon polyps are noncancerous (benign), but some can become cancerous over time.  This condition is diagnosed with a colonoscopy.  This condition is treated by removing any polyps that are found. Most polyps can be removed during a colonoscopy. This information is not intended to replace advice given to you by your health care provider. Make sure you discuss any questions you have with your health care provider. Document Revised: 02/19/2020 Document Reviewed: 02/19/2020 Elsevier Patient Education  2021 [employer]  

## 2021-03-29 IMAGING — MG DIGITAL DIAGNOSTIC BILAT W/ TOMO W/ CAD
8 of 14 series · 8 of 40 positions shown · non-contrast
Comparison: Previous exam(s).

CLINICAL DATA: Patient presents for evaluation of focal tenderness
right breast.

EXAM:
DIGITAL DIAGNOSTIC BILATERAL MAMMOGRAM WITH CAD AND TOMO
ULTRASOUND RIGHT BREAST

[L CC synth-2D (1 of 3)]
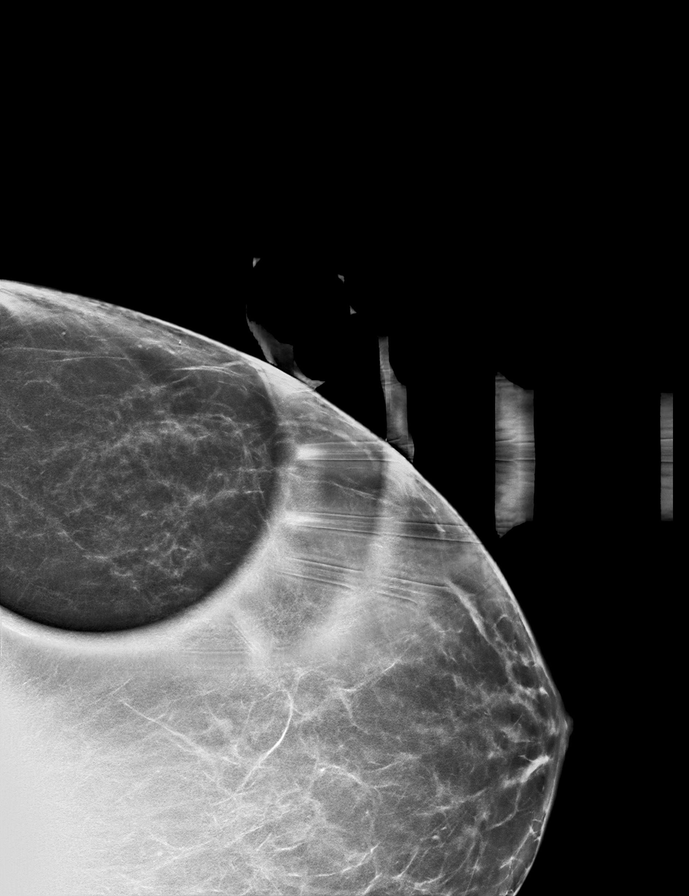

[L CC synth-2D (2 of 3)]
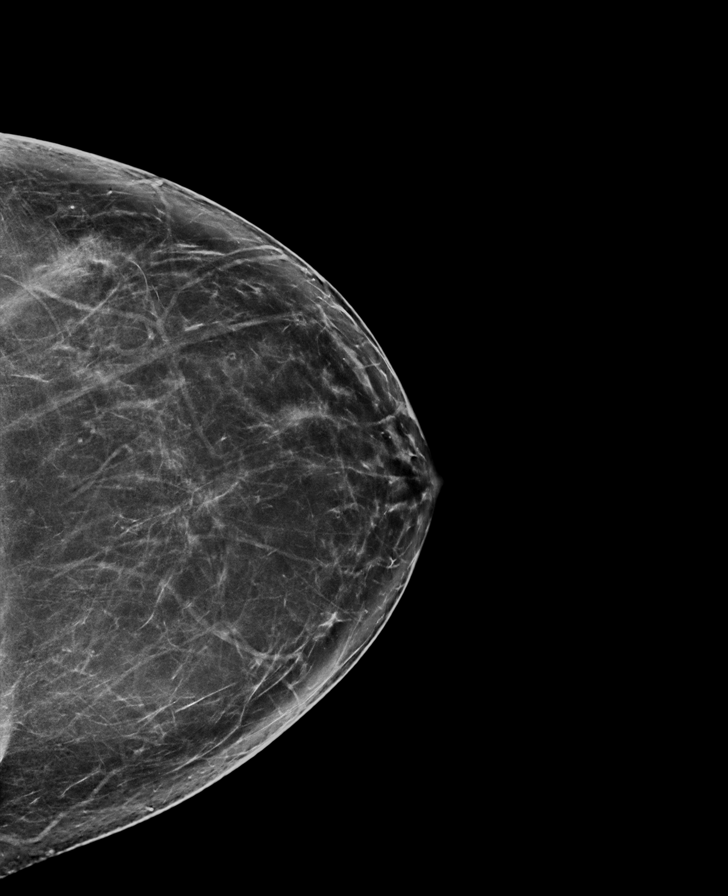

[R MLO synth-2D]
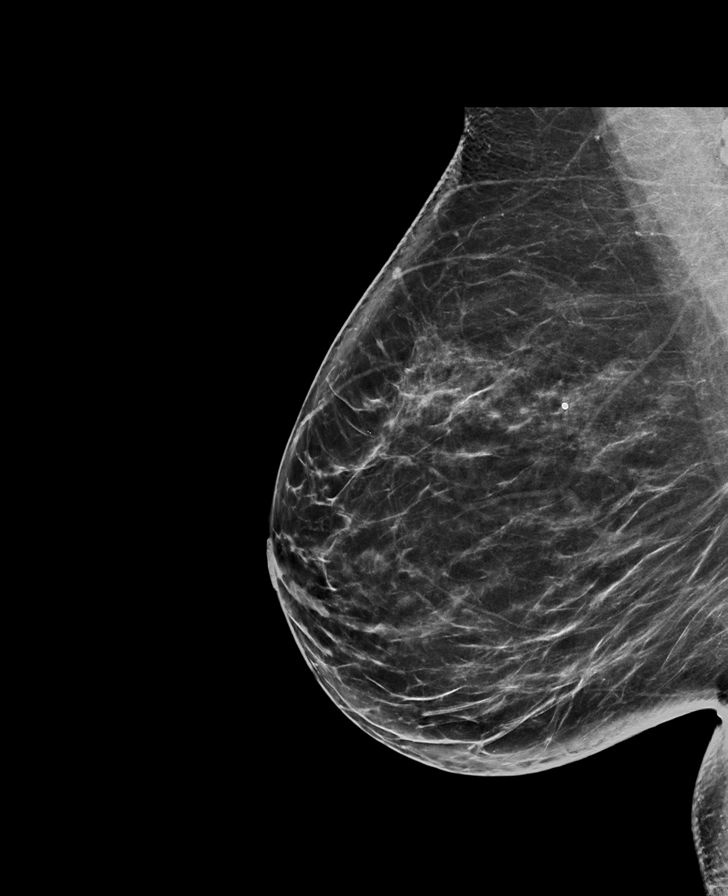

[L CC synth-2D (3 of 3)]
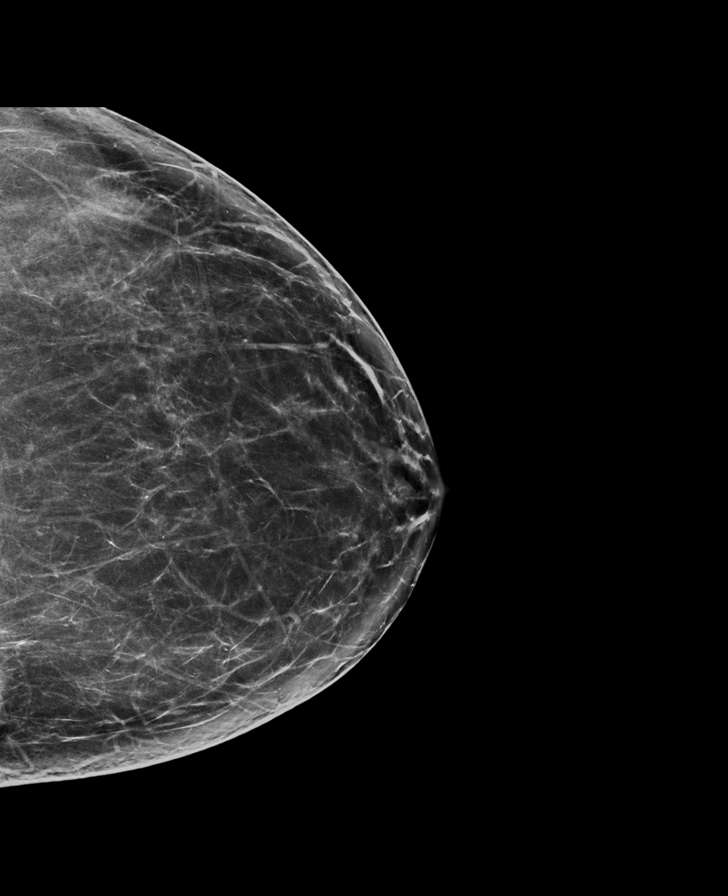

[L MLO synth-2D]
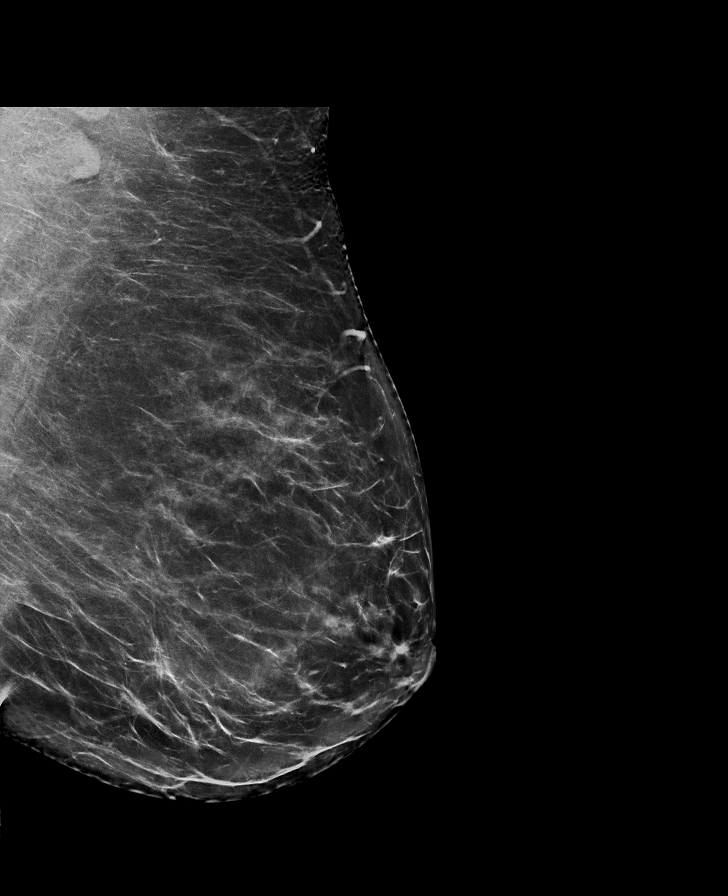

[L ML synth-2D]
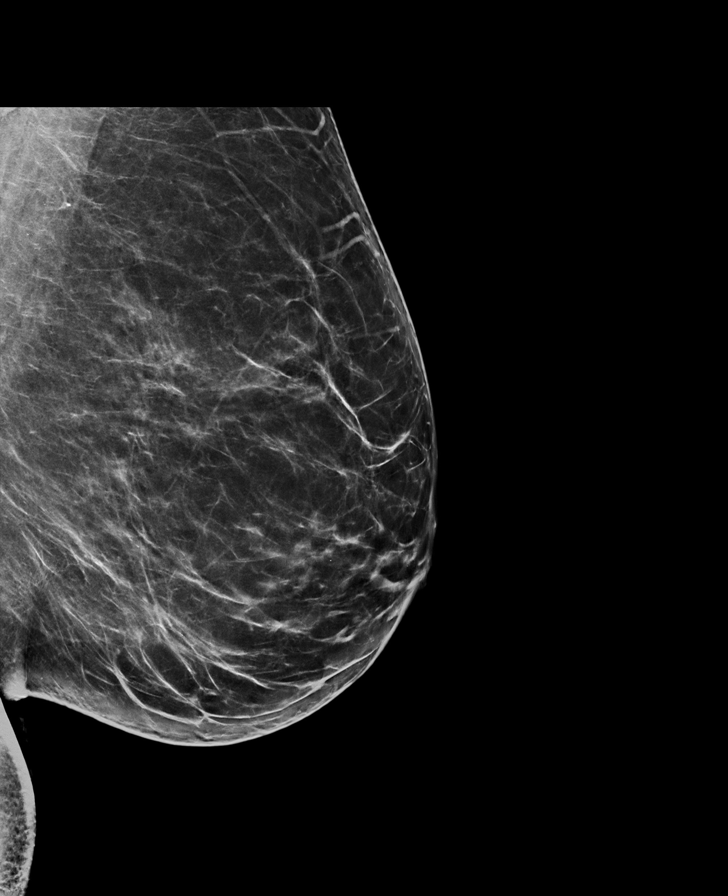

[R CC synth-2D]
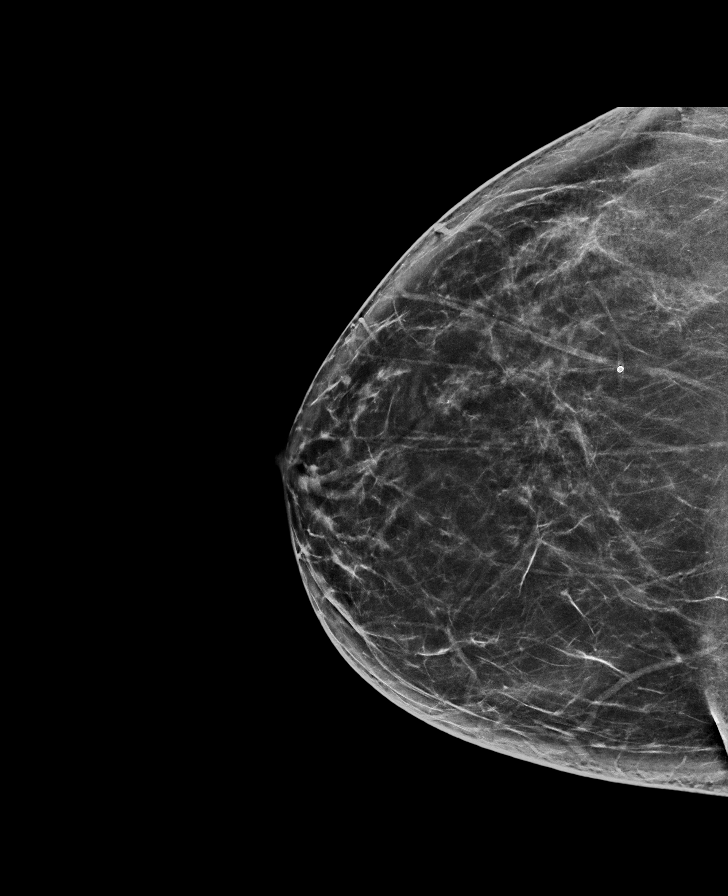

[L MLO tomo · tomo slice 47/94.0]
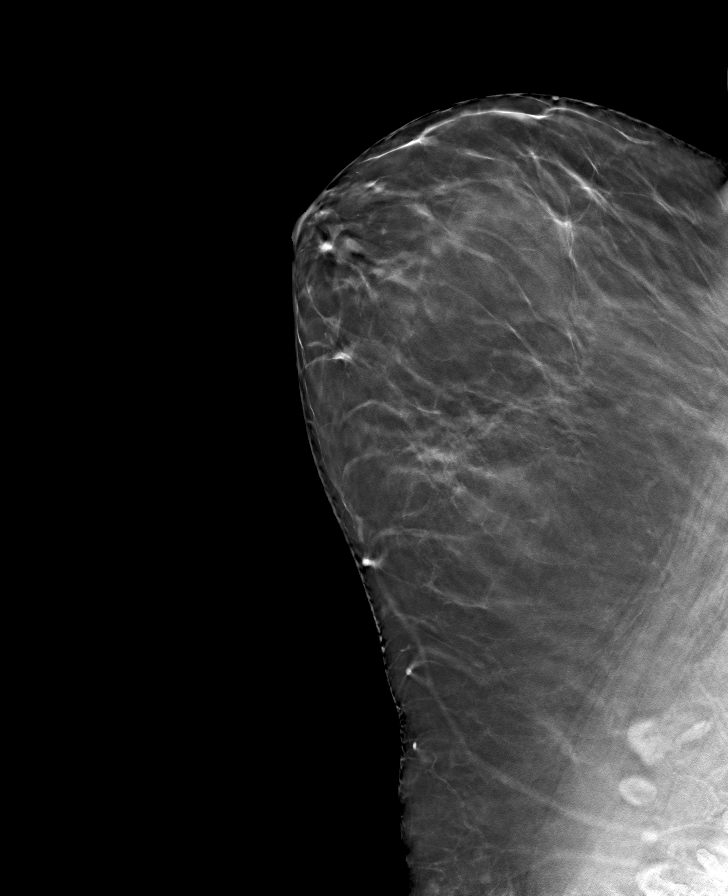

[8 of 40 positions shown; findings below may reference images not displayed]

ACR Breast Density Category b: There are scattered areas of
fibroglandular density.
FINDINGS: No concerning masses, calcifications or nonsurgical distortion
identified within either breast.

Mammographic images were processed with CAD.

Targeted ultrasound is performed, showing normal dense tissue
without suspicious mass right breast 8 o'clock position 6 cm from
nipple at the patient reported site of focal tenderness.
IMPRESSION: No mammographic evidence for malignancy.

No suspicious abnormality within the right breast at the site of
reported tenderness.

RECOMMENDATION:
Screening mammogram in one year.(Code:CV-V-64O).

Continued clinical evaluation for right breast tenderness.

I have discussed the findings and recommendations with the patient.
If applicable, a reminder letter will be sent to the patient
regarding the next appointment.

BI-RADS CATEGORY  2: Benign.

## 2021-03-30 ENCOUNTER — Encounter (INDEPENDENT_AMBULATORY_CARE_PROVIDER_SITE_OTHER): Payer: Self-pay | Admitting: Family Medicine

## 2021-03-30 ENCOUNTER — Ambulatory Visit (INDEPENDENT_AMBULATORY_CARE_PROVIDER_SITE_OTHER): Payer: 59 | Admitting: Family Medicine

## 2021-03-30 ENCOUNTER — Other Ambulatory Visit: Payer: Self-pay

## 2021-03-30 VITALS — BP 117/80 | HR 88 | Temp 98.3°F | Ht 68.0 in | Wt 220.0 lb

## 2021-03-30 DIAGNOSIS — Z9189 Other specified personal risk factors, not elsewhere classified: Secondary | ICD-10-CM

## 2021-03-30 DIAGNOSIS — R739 Hyperglycemia, unspecified: Secondary | ICD-10-CM

## 2021-03-30 DIAGNOSIS — R0602 Shortness of breath: Secondary | ICD-10-CM

## 2021-03-30 DIAGNOSIS — E7849 Other hyperlipidemia: Secondary | ICD-10-CM | POA: Diagnosis not present

## 2021-03-30 DIAGNOSIS — Z1331 Encounter for screening for depression: Secondary | ICD-10-CM | POA: Diagnosis not present

## 2021-03-30 DIAGNOSIS — R5383 Other fatigue: Secondary | ICD-10-CM

## 2021-03-30 DIAGNOSIS — E669 Obesity, unspecified: Secondary | ICD-10-CM

## 2021-03-30 DIAGNOSIS — Z6833 Body mass index (BMI) 33.0-33.9, adult: Secondary | ICD-10-CM

## 2021-03-30 DIAGNOSIS — Z0289 Encounter for other administrative examinations: Secondary | ICD-10-CM

## 2021-03-30 DIAGNOSIS — E890 Postprocedural hypothyroidism: Secondary | ICD-10-CM

## 2021-03-31 DIAGNOSIS — R5383 Other fatigue: Secondary | ICD-10-CM | POA: Insufficient documentation

## 2021-03-31 DIAGNOSIS — R0602 Shortness of breath: Secondary | ICD-10-CM | POA: Insufficient documentation

## 2021-03-31 LAB — CBC WITH DIFFERENTIAL
Basophils Absolute: 0.1 10*3/uL (ref 0.0–0.2)
Basos: 1 %
EOS (ABSOLUTE): 0.1 10*3/uL (ref 0.0–0.4)
Eos: 2 %
Hematocrit: 40.9 % (ref 34.0–46.6)
Hemoglobin: 13.8 g/dL (ref 11.1–15.9)
Immature Grans (Abs): 0 10*3/uL (ref 0.0–0.1)
Immature Granulocytes: 0 %
Lymphocytes Absolute: 1.9 10*3/uL (ref 0.7–3.1)
Lymphs: 28 %
MCH: 30.7 pg (ref 26.6–33.0)
MCHC: 33.7 g/dL (ref 31.5–35.7)
MCV: 91 fL (ref 79–97)
Monocytes Absolute: 0.4 10*3/uL (ref 0.1–0.9)
Monocytes: 6 %
Neutrophils Absolute: 4.4 10*3/uL (ref 1.4–7.0)
Neutrophils: 63 %
RBC: 4.49 x10E6/uL (ref 3.77–5.28)
RDW: 11.8 % (ref 11.7–15.4)
WBC: 6.9 10*3/uL (ref 3.4–10.8)

## 2021-03-31 LAB — HEMOGLOBIN A1C
Est. average glucose Bld gHb Est-mCnc: 117 mg/dL
Hgb A1c MFr Bld: 5.7 % — ABNORMAL HIGH (ref 4.8–5.6)

## 2021-03-31 LAB — COMPREHENSIVE METABOLIC PANEL
ALT: 17 IU/L (ref 0–32)
AST: 15 IU/L (ref 0–40)
Albumin/Globulin Ratio: 1.5 (ref 1.2–2.2)
Albumin: 4.4 g/dL (ref 3.8–4.9)
Alkaline Phosphatase: 111 IU/L (ref 44–121)
BUN/Creatinine Ratio: 17 (ref 9–23)
BUN: 13 mg/dL (ref 6–24)
Bilirubin Total: 0.9 mg/dL (ref 0.0–1.2)
CO2: 23 mmol/L (ref 20–29)
Calcium: 9.2 mg/dL (ref 8.7–10.2)
Chloride: 100 mmol/L (ref 96–106)
Creatinine, Ser: 0.78 mg/dL (ref 0.57–1.00)
Globulin, Total: 2.9 g/dL (ref 1.5–4.5)
Glucose: 93 mg/dL (ref 65–99)
Potassium: 4.5 mmol/L (ref 3.5–5.2)
Sodium: 140 mmol/L (ref 134–144)
Total Protein: 7.3 g/dL (ref 6.0–8.5)
eGFR: 89 mL/min/{1.73_m2} (ref >59)

## 2021-03-31 LAB — LIPID PANEL WITH LDL/HDL RATIO
Cholesterol, Total: 199 mg/dL (ref 100–199)
HDL: 44 mg/dL (ref >39)
LDL Chol Calc (NIH): 111 mg/dL — ABNORMAL HIGH (ref 0–99)
LDL/HDL Ratio: 2.5 ratio (ref 0.0–3.2)
Triglycerides: 254 mg/dL — ABNORMAL HIGH (ref 0–149)
VLDL Cholesterol Cal: 44 mg/dL — ABNORMAL HIGH (ref 5–40)

## 2021-03-31 LAB — FOLATE: Folate: >20 ng/mL (ref >3.0)

## 2021-03-31 LAB — VITAMIN D 25 HYDROXY (VIT D DEFICIENCY, FRACTURES): Vit D, 25-Hydroxy: 23.6 ng/mL — ABNORMAL LOW (ref 30.0–100.0)

## 2021-03-31 LAB — VITAMIN B12: Vitamin B-12: 619 pg/mL (ref 232–1245)

## 2021-03-31 LAB — INSULIN, RANDOM: INSULIN: 6.7 u[IU]/mL (ref 2.6–24.9)

## 2021-03-31 LAB — TSH: TSH: 0.954 u[IU]/mL (ref 0.450–4.500)

## 2021-03-31 NOTE — Progress Notes (Signed)
Chief Complaint:   OBESITY Jeanne Velez (MR# [id number]) is a 60 y.o. female who presents for evaluation and treatment of obesity and related comorbidities. Current BMI is Body mass index is 33.45 kg/m. Jeanne Velez has been struggling with her weight for many years and has been unsuccessful in either losing weight, maintaining weight loss, or reaching her healthy weight goal.  Jeanne Velez is currently in the action stage of change and ready to dedicate time achieving and maintaining a healthier weight. Jeanne Velez is interested in becoming our patient and working on intensive lifestyle modifications including (but not limited to) diet and exercise for weight loss.  Jeanne Velez's habits were reviewed today and are as follows: Her family eats meals together, she thinks her family will eat healthier with her, her desired weight loss is 40-45 lbs, she started gaining weight around early 2000's, her heaviest weight ever was 220 pounds, she has significant food cravings issues, she is trying to follow a vegetarian diet, she is frequently drinking liquids with calories, she frequently makes poor food choices, she frequently eats larger portions than normal and she struggles with emotional eating.  Depression Screen Jeanne Velez's Food and Mood (modified PHQ-9) score was 7.  Depression screen PHQ 2/9 03/30/2021  Decreased Interest 1  Down, Depressed, Hopeless 0  PHQ - 2 Score 1  Altered sleeping 0  Tired, decreased energy 0  Change in appetite 3  Feeling bad or failure about yourself  0  Trouble concentrating 0  Moving slowly or fidgety/restless 0  Suicidal thoughts 0  PHQ-9 Score 4  Difficult doing work/chores Not difficult at all   Subjective:   1. Other fatigue Jeanne Velez admits to daytime somnolence and denies waking up still tired. Patent has a history of symptoms of daytime fatigue. Jeanne Velez generally gets 7 or 8 hours of sleep per night, and states that she has generally restful sleep. Snoring is present. Apneic episodes are  not present. Epworth Sleepiness Score is 4.  2. SOB (shortness of breath) on exertion Jeanne Velez notes increasing shortness of breath with exercising and seems to be worsening over time with weight gain. She notes getting out of breath sooner with activity than she used to. This has not gotten worse recently. Jeanne Velez denies shortness of breath at rest or orthopnea.  3. Other hyperlipidemia Jeanne Velez is working on diet and exercise. She is not on statin, and she denies chest pain.  4. Hyperglycemia Jeanne Velez has had an elevated glucose of 182 last year. She denies a history of diabetes mellitus. She notes polyphagia at times.  5. Postoperative hypothyroidism Jeanne Velez is status post thyroid cancer and subsequent resection, and she is currently on levothyroxine 125 mcg daily.  6. At risk for malnutrition Jeanne Velez is at increased risk for malnutrition due to mostly vegetarian lifestyle.  Assessment/Plan:   1. Other fatigue Jeanne Velez does feel that her weight is causing her energy to be lower than it should be. Fatigue may be related to obesity, depression or many other causes. Labs will be ordered, and in the meanwhile, Jeanne Velez will focus on self care including making healthy food choices, increasing physical activity and focusing on stress reduction.  - Comprehensive metabolic panel - Vitamin B51 - VITAMIN D 25 Hydroxy (Vit-D Deficiency, Fractures) - Folate - CBC With Differential - EKG 12-Lead  2. SOB (shortness of breath) on exertion Jeanne Velez does feel that she gets out of breath more easily that she used to when she exercises. Jeanne Velez shortness of breath appears to be obesity related  and exercise induced. She has agreed to work on weight loss and gradually increase exercise to treat her exercise induced shortness of breath. Will continue to monitor closely.  3. Other hyperlipidemia Cardiovascular risk and specific lipid/LDL goals reviewed. We discussed several lifestyle modifications today. We will check labs today. Jeanne Velez  will start her Category 2 plan, and will continue to work on diet, exercise and weight loss efforts. Orders and follow up as documented in patient record.   - Lipid Panel With LDL/HDL Ratio  4. Hyperglycemia Fasting labs will be obtained and results with be discussed with Jeanne Velez in 2 weeks at her follow up visit. In the meanwhile Jeanne Velez will start her Category 2 plan and will work on weight loss efforts.  - Insulin, random - Hemoglobin A1c  5. Postoperative hypothyroidism We will check labs today. Jeanne Velez will follow up as directed. Orders and follow up as documented in patient record.  - TSH  6. Screening for depression Jeanne Velez had a positive depression screening. Depression is commonly associated with obesity and often results in emotional eating behaviors. We will monitor this closely and work on CBT to help improve the non-hunger eating patterns. Referral to Psychology may be required if no improvement is seen as she continues in our clinic.  7. At risk for malnutrition Jeanne Velez was given approximately 30 minutes of counseling today regarding prevention of malnutrition and ways to meet macronutrient goals..   8. Obesity with current BMI 33.5 Jeanne Velez is currently in the action stage of change and her goal is to continue with weight loss efforts. I recommend Jeanne Velez begin the structured treatment plan as follows:  She has agreed to the Category 2 Plan.  Exercise goals: No exercise has been prescribed for now while we concentrate on nutritional changes.  Behavioral modification strategies: increasing lean protein intake, decreasing simple carbohydrates and meal planning and cooking strategies.  She was informed of the importance of frequent follow-up visits to maximize her success with intensive lifestyle modifications for her multiple health conditions. She was informed we would discuss her lab results at her next visit unless there is a critical issue that needs to be addressed sooner. Jeanne Velez agreed to  keep her next visit at the agreed upon time to discuss these results.  Objective:   Blood pressure 117/80, pulse 88, temperature 98.3 F (36.8 C), height 5\' 8"  (1.727 m), weight 220 lb (99.8 kg), SpO2 97 %. Body mass index is 33.45 kg/m.  EKG: Normal sinus rhythm, rate 82 BPM.  Indirect Calorimeter completed today shows a VO2 of 258 and a REE of 1798.  Her calculated basal metabolic rate is 7616 thus her basal metabolic rate is better than expected.  General: Cooperative, alert, well developed, in no acute distress. HEENT: Conjunctivae and lids unremarkable. Cardiovascular: Regular rhythm.  Lungs: Normal work of breathing. Neurologic: No focal deficits.   Lab Results  Component Value Date   CREATININE 0.78 03/30/2021   BUN 13 03/30/2021   NA 140 03/30/2021   K 4.5 03/30/2021   CL 100 03/30/2021   CO2 23 03/30/2021   Lab Results  Component Value Date   ALT 17 03/30/2021   AST 15 03/30/2021   ALKPHOS 111 03/30/2021   BILITOT 0.9 03/30/2021   Lab Results  Component Value Date   HGBA1C 5.7 (H) 03/30/2021   HGBA1C 5.6 08/11/2020   Lab Results  Component Value Date   INSULIN 6.7 03/30/2021   Lab Results  Component Value Date   TSH 0.954 03/30/2021  Lab Results  Component Value Date   CHOL 199 03/30/2021   HDL 44 03/30/2021   LDLCALC 111 (H) 03/30/2021   TRIG 254 (H) 03/30/2021   CHOLHDL 4.5 (H) 07/28/2020   Lab Results  Component Value Date   WBC 6.9 03/30/2021   HGB 13.8 03/30/2021   HCT 40.9 03/30/2021   MCV 91 03/30/2021   PLT 247 10/10/2020   No results found for: IRON, TIBC, FERRITIN  Attestation Statements:   Reviewed by clinician on day of visit: allergies, medications, problem list, medical history, surgical history, family history, social history, and previous encounter notes.   I, Trixie Dredge, am acting as transcriptionist for Dennard Nip, MD.  I have reviewed the above documentation for accuracy and completeness, and I agree with the  above. - Dennard Nip, MD

## 2021-04-08 ENCOUNTER — Other Ambulatory Visit: Payer: Self-pay

## 2021-04-08 ENCOUNTER — Ambulatory Visit (INDEPENDENT_AMBULATORY_CARE_PROVIDER_SITE_OTHER): Payer: 59 | Admitting: Gastroenterology

## 2021-04-08 ENCOUNTER — Encounter: Payer: Self-pay | Admitting: Gastroenterology

## 2021-04-08 VITALS — BP 138/83 | HR 78 | Temp 98.0°F | Ht 68.0 in | Wt 222.2 lb

## 2021-04-08 DIAGNOSIS — R102 Pelvic and perineal pain: Secondary | ICD-10-CM | POA: Diagnosis not present

## 2021-04-08 DIAGNOSIS — R198 Other specified symptoms and signs involving the digestive system and abdomen: Secondary | ICD-10-CM

## 2021-04-08 NOTE — Progress Notes (Signed)
Cephas Darby, MD [address]  [place]  [place], [place] [address]  Main: [phone]  Fax: [phone]    Gastroenterology Consultation  Referring Provider:     Jearld Fenton, NP Primary Care Physician:  Jearld Fenton, NP Primary Gastroenterologist:  Dr. Cephas Darby Reason for Consultation:     Rectal pressure        HPI:   Jeanne Velez is a 60 y.o. female referred by Dr. Jearld Fenton, NP  for consultation & management of rectal pressure.  Patient reports that for last 1 and half months, she has been experiencing sensation of pressure in her rectum and is taking longer time to urinate especially towards end of urination sometimes up to 15 minutes due to sensation of incomplete emptying of her bladder.  She also noticed rectal pressure upon sitting and during exercise.  She denies history of constipation at the time of symptoms started.  Generally has regular bowel movements.  For last 1 week, she is on high-protein diet as recommended by her PCP for weight loss.  And noticed that her bowel habits are infrequent.  She is managing it with increased intake of water.    Patient had history of precancerous polyps in her colon, most recently performed in 2020, found to have subcentimeter tubular adenoma in sigmoid colon and cecum.  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, which was completely resected, with no evidence of recurrence  Patient denies any rectal bleeding, prolapse, itching, rectal pain  NSAIDs: None  Antiplts/Anticoagulants/Anti thrombotics: None  GI Procedures:  Colonoscopy 10/08/2019 3 mm polyp in the sigmoid colon, polypectomy was performed 5 mm polyp in the cecum, polypectomy performed, previously placed tattoo in the rectum was identified, appeared normal, TI was normal Pathology revealed tubular adenoma with no evidence of dysplasia  Colonoscopy 12/09/2015 Scar at the previous polypectomy site in the rectum was  identified, appeared normal on NBI  Upper endoscopy 12/09/2015 Small hiatal hernia, normal esophagus, biopsies performed Normal stomach and duodenum Normal pathology  Colonoscopy 12/04/2013 3 to 5 mm polyps in the transverse colon, ascending colon and rectum, polypectomy was performed Sessile serrated polyp, tubular adenoma, tubular adenoma with no evidence of dysplasia confirmed on pathology  Colonoscopy 05/22/2012 Post polypectomy scar was found in the rectum, biopsies were performed, NBI was performed 2 mm polyp in ascending colon, removed Pathology revealed tubular adenoma of the ascending colon, reportedly negative  Colonoscopy 02/01/2012   Past Medical History:  Diagnosis Date  . Acne   . Allergy   . Anemia   . Back pain   . BRCA negative 07/2020   MyRisk neg  . Cataracts, bilateral   . Colon polyp   . Constipation   . Family history of breast cancer 07/2020   IBIS=15.1%/riskscore=6.7%  . Folliculitis   . GERD (gastroesophageal reflux disease)   . Hyperlipidemia   . Hypothyroidism   . Joint pain   . Macular degeneration   . Osteoarthritis   . Retinal vasculitis   . Stress   . Thyroid cancer [facility])     Past Surgical History:  Procedure Laterality Date  . BREAST BIOPSY    . ovarial cystectomy    . THYROIDECTOMY    . TOTAL ABDOMINAL HYSTERECTOMY W/ BILATERAL SALPINGOOPHORECTOMY     dermoids, ovar cysts  . TUBAL LIGATION     Current Outpatient Medications:  .  acetaminophen (TYLENOL) 325 MG tablet, Take 650 mg by mouth every  6 (six) hours as needed., Disp: , Rfl:  .  Adapalene (DIFFERIN) 0.3 % gel, Apply 1 application topically at bedtime., Disp: 45 g, Rfl: 11 .  calcium-vitamin D (OSCAL WITH D) 500-200 MG-UNIT tablet, Take 1 tablet by mouth., Disp: , Rfl:  .  clindamycin (CLINDAGEL) 1 % gel, Apply 1 application topically daily., Disp: 30 g, Rfl: 5 .  doxycycline (PERIOSTAT) 20 MG tablet, Take 1 tablet (20 mg total) by mouth daily. For acne. Take with food, Disp:  30 tablet, Rfl: 3 .  estradiol (ESTRACE) 0.5 MG tablet, Take 1 tablet (0.5 mg total) by mouth daily. (Patient taking differently: Take 0.5 mg by mouth 3 (three) times a week.), Disp: 90 tablet, Rfl: 3 .  ketoconazole (NIZORAL) 2 % cream, Apply 1 application topically daily as needed for irritation., Disp: 15 g, Rfl: 5 .  ketoconazole (NIZORAL) 2 % shampoo, Massage into scalp and let sit 10 minutes before washing out. Use three times weekly., Disp: 120 mL, Rfl: 11 .  levothyroxine (SYNTHROID) 125 MCG tablet, Take 125 mcg by mouth daily before breakfast., Disp: , Rfl:  .  loratadine (CLARITIN) 10 MG tablet, Take 10 mg by mouth daily., Disp: , Rfl:  .  Multiple Vitamins-Minerals (PRESERVISION AREDS 2 PO), Take 1 capsule by mouth 2 (two) times daily. , Disp: , Rfl:  .  NON FORMULARY, Hypochlorous spray on face and back 2x daily, Disp: , Rfl:  .  Probiotic Product (PROBIOTIC-10 PO), Take by mouth., Disp: , Rfl:  .  valACYclovir (VALTREX) 1000 MG tablet, Take 1,000 mg by mouth 2 (two) times daily as needed. , Disp: , Rfl:     Family History  Problem Relation Age of Onset  . Myasthenia gravis Mother   . Heart disease Father   . Depression Father   . Skin cancer Father 65  . Cancer Father   . Anxiety disorder Father   . Obesity Father   . Breast cancer Maternal Grandmother 34  . Skin cancer Maternal Grandmother 78  . Breast cancer Cousin 35  . Bone cancer Maternal Aunt 60  . Melanoma Maternal Aunt 34     Social History   Tobacco Use  . Smoking status: Never Smoker  . Smokeless tobacco: Never Used  Vaping Use  . Vaping Use: Never used  Substance Use Topics  . Alcohol use: Yes  . Drug use: Never    Allergies as of 04/08/2021 - Review Complete 04/08/2021  Allergen Reaction Noted  . Adhesive [tape]  07/24/2020  . Cat hair extract  10/16/2020  . Ginger Swelling 11/14/2018  . Latex Itching 11/14/2004  . Penicillin g Swelling 11/14/1965  . Penicillins  07/24/2020  . Wound dressing  adhesive Itching and Swelling 11/14/2004    Review of Systems:    All systems reviewed and negative except where noted in HPI.   Physical Exam:  BP 138/83 (BP Location: Left Arm, Patient Position: Sitting, Cuff Size: Normal)   Pulse 78   Temp 98 F (36.7 C) (Oral)   Ht '5\' 8"'  (1.727 m)   Wt 222 lb 4 oz (100.8 kg)   BMI 33.79 kg/m  No LMP recorded. Patient has had a hysterectomy.  General:   Alert,  Well-developed, well-nourished, pleasant and cooperative in NAD Head:  Normocephalic and atraumatic. Eyes:  Sclera clear, no icterus.   Conjunctiva pink. Ears:  Normal auditory acuity. Nose:  No deformity, discharge, or lesions. Mouth:  No deformity or lesions,oropharynx pink & moist. Neck:  Supple; no  masses or thyromegaly. Lungs:  Respirations even and unlabored.  Clear throughout to auscultation.   No wheezes, crackles, or rhonchi. No acute distress. Heart:  Regular rate and rhythm; no murmurs, clicks, rubs, or gallops. Abdomen:  Normal bowel sounds. Soft, mild suprapubic tenderness and non-distended without masses, hepatosplenomegaly or hernias noted.  No guarding or rebound tenderness.   Rectal: Nontender digital rectal exam, a very small perianal skin tag, normal perianal exam, palpable external hemorrhoid Msk:  Symmetrical without gross deformities. Good, equal movement & strength bilaterally. Pulses:  Normal pulses noted. Extremities:  No clubbing or edema.  No cyanosis. Neurologic:  Alert and oriented x3;  grossly normal neurologically. Skin:  Intact without significant lesions or rashes. No jaundice. Psych:  Alert and cooperative. Normal mood and affect.  Imaging Studies: None  Assessment and Plan:   Jeanne Velez is a 60 y.o. pleasant Caucasian female with metabolic syndrome, personal history of colon polyps is seen in consultation for 1 month history of rectal pressure.  She does have mild suprapubic discomfort.  She had history of hysterectomy.  Given that patient had  regular bowel movements at the time of onset of rectal pressure, the presence of suprapubic discomfort recommend urine analysis to rule out infection.  If this is negative, will proceed with flexible sigmoidoscopy  Advised patient to update me via MyChart  Follow up as needed   Cephas Darby, MD

## 2021-04-09 ENCOUNTER — Encounter: Payer: Self-pay | Admitting: Gastroenterology

## 2021-04-09 ENCOUNTER — Other Ambulatory Visit: Payer: Self-pay

## 2021-04-09 ENCOUNTER — Telehealth: Payer: Self-pay

## 2021-04-09 DIAGNOSIS — R198 Other specified symptoms and signs involving the digestive system and abdomen: Secondary | ICD-10-CM

## 2021-04-09 DIAGNOSIS — Z8719 Personal history of other diseases of the digestive system: Secondary | ICD-10-CM

## 2021-04-09 LAB — URINALYSIS
Bilirubin, UA: NEGATIVE
Glucose, UA: NEGATIVE
Ketones, UA: NEGATIVE
Leukocytes,UA: NEGATIVE
Nitrite, UA: NEGATIVE
Protein,UA: NEGATIVE
Specific Gravity, UA: 1.009 (ref 1.005–1.030)
Urobilinogen, Ur: 0.2 mg/dL (ref 0.2–1.0)
pH, UA: 6 (ref 5.0–7.5)

## 2021-04-09 NOTE — Telephone Encounter (Signed)
-----   Message from Lin Landsman, MD sent at 04/09/2021  9:58 AM EDT ----- Regarding: Flex sig Caryl Pina  Schedule sigmoidoscopy with anesthesia Dx: Rectal pressure, history of rectal polyp  RV

## 2021-04-09 NOTE — Telephone Encounter (Signed)
Called patient and scheduled patient for 04/28/2021. Went over instructions with patient and sent to [employer]

## 2021-04-13 ENCOUNTER — Ambulatory Visit (INDEPENDENT_AMBULATORY_CARE_PROVIDER_SITE_OTHER): Payer: 59 | Admitting: Family Medicine

## 2021-04-13 ENCOUNTER — Other Ambulatory Visit: Payer: Self-pay

## 2021-04-13 ENCOUNTER — Encounter (INDEPENDENT_AMBULATORY_CARE_PROVIDER_SITE_OTHER): Payer: Self-pay | Admitting: Family Medicine

## 2021-04-13 VITALS — BP 130/85 | HR 80 | Temp 98.4°F | Ht 68.0 in | Wt 215.0 lb

## 2021-04-13 DIAGNOSIS — Z9189 Other specified personal risk factors, not elsewhere classified: Secondary | ICD-10-CM

## 2021-04-13 DIAGNOSIS — R7303 Prediabetes: Secondary | ICD-10-CM

## 2021-04-13 DIAGNOSIS — E669 Obesity, unspecified: Secondary | ICD-10-CM

## 2021-04-13 DIAGNOSIS — E782 Mixed hyperlipidemia: Secondary | ICD-10-CM | POA: Diagnosis not present

## 2021-04-13 DIAGNOSIS — E559 Vitamin D deficiency, unspecified: Secondary | ICD-10-CM | POA: Diagnosis not present

## 2021-04-13 DIAGNOSIS — E039 Hypothyroidism, unspecified: Secondary | ICD-10-CM

## 2021-04-13 DIAGNOSIS — Z6833 Body mass index (BMI) 33.0-33.9, adult: Secondary | ICD-10-CM

## 2021-04-13 MED ORDER — VITAMIN D (ERGOCALCIFEROL) 1.25 MG (50000 UNIT) PO CAPS: 50000.0000 [IU] | ORAL_CAPSULE | ORAL | 0 refills | Status: DC

## 2021-04-14 ENCOUNTER — Ambulatory Visit: Payer: 59 | Admitting: Internal Medicine

## 2021-04-14 NOTE — Progress Notes (Signed)
Chief Complaint:   OBESITY Jeanne Velez is here to discuss her progress with her obesity treatment plan along with follow-up of her obesity related diagnoses. Jeanne Velez is on the Category 2 Plan and states she is following her eating plan approximately 100% of the time. Jeanne Velez states she is walking for 40 minutes 5 times per week, and she is gardening 3-4 hour 5 times per week.  Today's visit was #: 2 Starting weight: 220 lbs Starting date: 03/30/2021 Today's weight: 215 lbs Today's date: 04/13/2021 Total lbs lost to date: 5 Total lbs lost since last in-office visit: 5  Interim History: Jeanne Velez has done very well with weight loss. 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 She struggled to eat all of the protein in her meals, but she noted her hunger was well controlled.  Subjective:   1. Mixed hyperlipidemia Jeanne Velez's triglycerides and LDL are elevated. She is not on a statin, but is she is working on diet and exercise. I discussed labs with the patient today.  2. Vitamin D deficiency Jeanne Velez is on OTC calcium plus D, but her Vit D level is still low. Overall she is a high energy person, but she notes this is lower than normal recently. I discussed labs with the patient today.   3. Pre-diabetes Jeanne Velez's A1c is elevated at 5.7. She is working on diet and weight loss. She notes decreased polyphagia with decreased simple carbohydrates. I discussed labs with the patient today.  4. Hypothyroidism, unspecified type Jeanne Velez is on Synthroid 125 mcg and her TSH is within normal limits. I discussed labs with the patient today.  5. At risk for diabetes mellitus Jeanne Velez is at higher than average risk for developing diabetes due to obesity.   Assessment/Plan:   1. Mixed hyperlipidemia Cardiovascular risk and specific lipid/LDL goals reviewed. We discussed several lifestyle modifications today. Jeanne Velez will continue to work on diet, exercise and weight loss efforts. We will will  recheck labs in 3 months. Orders and follow up as documented in patient record.   2. Vitamin D deficiency Low Vitamin D level contributes to fatigue and are associated with obesity, breast, and colon cancer. Jeanne Velez agreed to start prescription Vitamin D 50,000 IU every week with no refills. We will recheck labs in 3 months, and she will follow-up for routine testing of Vitamin D, at least 2-3 times per year to avoid over-replacement.  - Vitamin D, Ergocalciferol, (DRISDOL) 1.25 MG (50000 UNIT) CAPS capsule; Take 1 capsule (50,000 Units total) by mouth every 7 (seven) days.  Dispense: 4 capsule; Refill: 0  3. Pre-diabetes Jeanne Velez will continue with diet and exercise, she will defer metformin for now, but may consider it pin the future if she started to struggle.   4. Hypothyroidism, unspecified type Jeanne Velez will continue Synthroid as is, and will continue to follow up as directed. Orders and follow up as documented in patient record.  5. At risk for diabetes mellitus Jeanne Velez was given approximately 30 minutes of diabetes education and counseling today. We discussed intensive lifestyle modifications today with an emphasis on weight loss as well as increasing exercise and decreasing simple carbohydrates in her diet. We also reviewed medication options with an emphasis on risk versus benefit of those discussed.   Repetitive spaced learning was employed today to elicit superior memory formation and behavioral change.  6. Obesity with current BMI 32.8 Jeanne Velez is currently in the action stage of change. As such, her goal is to continue  with weight loss efforts. She has agreed to keeping a food journal and adhering to recommended goals of 1200-1400 calories and 80+ grams of protein daily.   Exercise goals: As is.  Behavioral modification strategies: increasing lean protein intake, increasing water intake and increasing high fiber foods.  Jeanne Velez has agreed to follow-up with our clinic in 2 to 3 weeks. She was  informed of the importance of frequent follow-up visits to maximize her success with intensive lifestyle modifications for her multiple health conditions.   Objective:   Blood pressure 130/85, pulse 80, temperature 98.4 F (36.9 C), height 5\' 8"  (1.727 m), weight 215 lb (97.5 kg), SpO2 100 %. Body mass index is 32.69 kg/m.  General: Cooperative, alert, well developed, in no acute distress. HEENT: Conjunctivae and lids unremarkable. Cardiovascular: Regular rhythm.  Lungs: Normal work of breathing. Neurologic: No focal deficits.   Lab Results  Component Value Date   CREATININE 0.78 03/30/2021   BUN 13 03/30/2021   NA 140 03/30/2021   K 4.5 03/30/2021   CL 100 03/30/2021   CO2 23 03/30/2021   Lab Results  Component Value Date   ALT 17 03/30/2021   AST 15 03/30/2021   ALKPHOS 111 03/30/2021   BILITOT 0.9 03/30/2021   Lab Results  Component Value Date   HGBA1C 5.7 (H) 03/30/2021   HGBA1C 5.6 08/11/2020   Lab Results  Component Value Date   INSULIN 6.7 03/30/2021   Lab Results  Component Value Date   TSH 0.954 03/30/2021   Lab Results  Component Value Date   CHOL 199 03/30/2021   HDL 44 03/30/2021   LDLCALC 111 (H) 03/30/2021   TRIG 254 (H) 03/30/2021   CHOLHDL 4.5 (H) 07/28/2020   Lab Results  Component Value Date   WBC 6.9 03/30/2021   HGB 13.8 03/30/2021   HCT 40.9 03/30/2021   MCV 91 03/30/2021   PLT 247 10/10/2020   No results found for: IRON, TIBC, FERRITIN  Attestation Statements:   Reviewed by clinician on day of visit: allergies, medications, problem list, medical history, surgical history, family history, social history, and previous encounter notes.   I, Trixie Dredge, am acting as transcriptionist for Dennard Nip, MD.  I have reviewed the above documentation for accuracy and completeness, and I agree with the above. -  Dennard Nip, MD

## 2021-04-16 ENCOUNTER — Ambulatory Visit: Payer: 59 | Admitting: Family Medicine

## 2021-04-19 ENCOUNTER — Encounter: Payer: Self-pay | Admitting: Anesthesiology

## 2021-04-19 ENCOUNTER — Other Ambulatory Visit: Payer: Self-pay

## 2021-04-28 ENCOUNTER — Other Ambulatory Visit: Payer: Self-pay

## 2021-04-28 ENCOUNTER — Ambulatory Visit: Payer: 59 | Admitting: Anesthesiology

## 2021-04-28 ENCOUNTER — Encounter: Payer: Self-pay | Admitting: Gastroenterology

## 2021-04-28 ENCOUNTER — Encounter
Admission: RE | Disposition: A | Discharge status: Home / Self Care | Payer: Self-pay | Source: Home / Self Care | Attending: Gastroenterology

## 2021-04-28 ENCOUNTER — Ambulatory Visit
Admission: RE | Admit: 2021-04-28 | Discharge: 2021-04-28 | Disposition: A | Discharge status: Home / Self Care | Payer: 59 | Attending: Gastroenterology | Admitting: Gastroenterology

## 2021-04-28 DIAGNOSIS — Z792 Long term (current) use of antibiotics: Secondary | ICD-10-CM | POA: Insufficient documentation

## 2021-04-28 DIAGNOSIS — R198 Other specified symptoms and signs involving the digestive system and abdomen: Secondary | ICD-10-CM

## 2021-04-28 DIAGNOSIS — Z86010 Personal history of colonic polyps: Secondary | ICD-10-CM

## 2021-04-28 DIAGNOSIS — K6289 Other specified diseases of anus and rectum: Secondary | ICD-10-CM | POA: Diagnosis not present

## 2021-04-28 DIAGNOSIS — Z8719 Personal history of other diseases of the digestive system: Secondary | ICD-10-CM | POA: Diagnosis not present

## 2021-04-28 DIAGNOSIS — Z79899 Other long term (current) drug therapy: Secondary | ICD-10-CM | POA: Diagnosis not present

## 2021-04-28 DIAGNOSIS — Z79890 Hormone replacement therapy (postmenopausal): Secondary | ICD-10-CM | POA: Diagnosis not present

## 2021-04-28 HISTORY — PX: FLEXIBLE SIGMOIDOSCOPY: SHX5431

## 2021-04-28 SURGERY — SIGMOIDOSCOPY, FLEXIBLE
Anesthesia: General

## 2021-04-28 MED ORDER — LIDOCAINE 2% (20 MG/ML) 5 ML SYRINGE
INTRAMUSCULAR | Status: DC | PRN
  Administered 2021-04-28: 25 mg via INTRAVENOUS

## 2021-04-28 MED ORDER — PROPOFOL 500 MG/50ML IV EMUL
INTRAVENOUS | Status: DC | PRN
  Administered 2021-04-28: 120 ug/kg/min via INTRAVENOUS

## 2021-04-28 MED ORDER — SODIUM CHLORIDE 0.9 % IV SOLN: INTRAVENOUS | Status: DC

## 2021-04-28 MED ORDER — PROPOFOL 10 MG/ML IV BOLUS
INTRAVENOUS | Status: DC | PRN
  Administered 2021-04-28: 30 mg via INTRAVENOUS
  Administered 2021-04-28: 70 mg via INTRAVENOUS

## 2021-04-28 NOTE — Anesthesia Postprocedure Evaluation (Signed)
Anesthesia Post Note  Patient: Jeanne Velez  Procedure(s) Performed: [employer]  Patient location during evaluation: Phase II Anesthesia Type: General Level of consciousness: awake and alert, awake and oriented Pain management: pain level controlled Vital Signs Assessment: post-procedure vital signs reviewed and stable Respiratory status: spontaneous breathing, nonlabored ventilation and respiratory function stable Cardiovascular status: blood pressure returned to baseline and stable Postop Assessment: no apparent nausea or vomiting Anesthetic complications: no   No notable events documented.   Last Vitals:  Vitals:   04/28/21 1038 04/28/21 1048  BP: 122/73 120/75  Pulse: 81 62  Resp: 14 14  Temp:    SpO2: 99% 95%    Last Pain:  Vitals:   04/28/21 1048  TempSrc:   PainSc: 0-No pain                 Phill Mutter

## 2021-04-28 NOTE — Op Note (Signed)
[facility] Gastroenterology Patient Name: Jeanne Velez Procedure Date: 04/28/2021 10:17 AM MRN: [id number] Account #: [id number] Date of Birth: 1961/06/18 Admit Type: Outpatient Age: 60 Room: [facility] ENDO ROOM 3 Gender: Female Note Status: Finalized Procedure:             Flexible Sigmoidoscopy Indications:           Rectal pain Providers:             Lin Landsman MD, MD Referring MD:          Jearld Fenton (Referring MD) Medicines:             General Anesthesia Complications:         No immediate complications. Estimated blood loss: None. Procedure:             Pre-Anesthesia Assessment:                        - Prior to the procedure, a History and Physical was                         performed, and patient medications and allergies were                         reviewed. The patient is competent. The risks and                         benefits of the procedure and the sedation options and                         risks were discussed with the patient. All questions                         were answered and informed consent was obtained.                         Patient identification and proposed procedure were                         verified by the physician, the nurse, the                         anesthesiologist, the anesthetist and the technician                         in the pre-procedure area in the procedure room in the                         endoscopy suite. Mental Status Examination: alert and                         oriented. Airway Examination: normal oropharyngeal                         airway and neck mobility. Respiratory Examination:                         clear to auscultation. CV Examination: normal.  Prophylactic Antibiotics: The patient does not require                         prophylactic antibiotics. Prior Anticoagulants: The                         patient has taken no previous anticoagulant or                          antiplatelet agents. ASA Grade Assessment: II - A                         patient with mild systemic disease. After reviewing                         the risks and benefits, the patient was deemed in                         satisfactory condition to undergo the procedure. The                         anesthesia plan was to use general anesthesia.                         Immediately prior to administration of medications,                         the patient was re-assessed for adequacy to receive                         sedatives. The heart rate, respiratory rate, oxygen                         saturations, blood pressure, adequacy of pulmonary                         ventilation, and response to care were monitored                         throughout the procedure. The physical status of the                         patient was re-assessed after the procedure.                        After obtaining informed consent, the scope was passed                         under direct vision. The Endoscope was introduced                         through the anus and advanced to the the descending                         colon. The flexible sigmoidoscopy was accomplished                         without difficulty. The patient tolerated the  procedure well. The quality of the bowel preparation                         was poor. Findings:      The perianal and digital rectal examinations were normal. Pertinent       negatives include normal sphincter tone and no palpable rectal lesions.      A tattoo was seen in the sigmoid colon. The tattoo site appeared normal.      Normal retroflexion in rectum      Brown liquid stool in rectum and left colon Impression:            - Preparation of the colon was poor.                        - A tattoo was seen in the sigmoid colon. The tattoo                         site appeared normal.                        - No specimens  collected. Recommendation:        - Discharge patient to home (with escort).                        - Resume previous diet today.                        - Continue present medications. Procedure Code(s):     --- Professional ---                        [phone], Sigmoidoscopy, flexible; diagnostic, including                         collection of specimen(s) by brushing or washing, when                         performed (separate procedure) Diagnosis Code(s):     --- Professional ---                        K62.89, Other specified diseases of anus and rectum CPT copyright 2019 American Medical Association. All rights reserved. The codes documented in this report are preliminary and upon coder review may  be revised to meet current compliance requirements. Dr. Ulyess Mort Lin Landsman MD, MD 04/28/2021 10:37:35 AM This report has been signed electronically. Number of Addenda: 0 Note Initiated On: 04/28/2021 10:17 AM Total Procedure Duration: 0 hours 3 minutes 21 seconds  Estimated Blood Loss:  Estimated blood loss: none.      [facility]

## 2021-04-28 NOTE — Anesthesia Preprocedure Evaluation (Signed)
Anesthesia Evaluation  Patient identified by MRN, date of birth, ID band Patient awake    Reviewed: Allergy & Precautions, NPO status , Patient's Chart, lab work & pertinent test results  Airway Mallampati: II  TM Distance: >3 FB Neck ROM: Full    Dental no notable dental hx.    Pulmonary neg pulmonary ROS,    Pulmonary exam normal        Cardiovascular hypertension, Normal cardiovascular exam     Neuro/Psych negative neurological ROS  negative psych ROS   GI/Hepatic Neg liver ROS, GERD  Medicated,  Endo/Other  Hypothyroidism   Renal/GU negative Renal ROS  negative genitourinary   Musculoskeletal  (+) Arthritis , Osteoarthritis,    Abdominal (+) + obese,   Peds negative pediatric ROS (+)  Hematology negative hematology ROS (+) anemia ,   Anesthesia Other Findings Acne    Allergy    Anemia    Back pain    BRCA negative 07/2020 MyRisk neg  Cataracts, bilateral    Colon polyp    Constipation    Family history of breast cancer 07/2020 FXOV=29.1%/BTYOMAYOK=5.9%  Folliculitis    GERD (gastroesophageal reflux disease)    Hyperlipidemia    Hypothyroidism    Joint pain    Macular degeneration    Osteoarthritis    Retinal vasculitis    Stress    Thyroid cancer ([place])       Reproductive/Obstetrics negative OB ROS                             Anesthesia Physical Anesthesia Plan  ASA: 2  Anesthesia Plan: General   Post-op Pain Management:    Induction: Intravenous  PONV Risk Score and Plan: 3 and Propofol infusion and TIVA  Airway Management Planned: Natural Airway and Nasal Cannula  Additional Equipment:   Intra-op Plan:   Post-operative Plan:   Informed Consent: I have reviewed the patients History and Physical, chart, labs and discussed the procedure including the risks, benefits and alternatives for the proposed anesthesia with the patient or authorized representative  who has indicated his/her understanding and acceptance.       Plan Discussed with: Anesthesiologist  Anesthesia Plan Comments:         Anesthesia Quick Evaluation

## 2021-04-28 NOTE — Transfer of Care (Signed)
Immediate Anesthesia Transfer of Care Note  Patient: Jeanne Velez  Procedure(s) Performed: FLEXIBLE SIGMOIDOSCOPY  Patient Location: Endoscopy Unit  Anesthesia Type:General  Level of Consciousness: awake  Airway & Oxygen Therapy: Patient Spontanous Breathing  Post-op Assessment: Report given to RN and Post -op Vital signs reviewed and stable  Post vital signs: Reviewed  Last Vitals:  Vitals Value Taken Time  BP 122/73 04/28/21 1038  Temp    Pulse 81 04/28/21 1038  Resp 14 04/28/21 1038  SpO2 99 % 04/28/21 1038    Last Pain:  Vitals:   04/28/21 1038  TempSrc:   PainSc: 0-No pain         Complications: No notable events documented.

## 2021-04-28 NOTE — H&P (Signed)
Cephas Darby, MD [address]  [place]  [place], [place] [address]  Main: [phone]  Fax: [phone] Pager: [phone]  Primary Care Physician:  Jearld Fenton, NP Primary Gastroenterologist:  Dr. Cephas Darby  Pre-Procedure History & Physical: HPI:  Jeanne Velez is a 60 y.o. female is here for an flexible sigmoidoscopy.   Past Medical History:  Diagnosis Date   Acne    Allergy    Anemia    Back pain    BRCA negative 07/2020   MyRisk neg   Cataracts, bilateral    Colon polyp    Constipation    Family history of breast cancer 07/2020   RXVQ=00.8%/QPYPPJKDT=2.6%   Folliculitis    GERD (gastroesophageal reflux disease)    Hyperlipidemia    Hypothyroidism    Joint pain    Macular degeneration    Osteoarthritis    Retinal vasculitis    Stress    Thyroid cancer ([place])     Past Surgical History:  Procedure Laterality Date   BREAST BIOPSY     ovarial cystectomy     THYROIDECTOMY     TOTAL ABDOMINAL HYSTERECTOMY W/ BILATERAL SALPINGOOPHORECTOMY     dermoids, ovar cysts   TUBAL LIGATION      Prior to Admission medications   Medication Sig Start Date End Date Taking? Authorizing Provider  Adapalene (DIFFERIN) 0.3 % gel Apply 1 application topically at bedtime. 12/30/20  Yes Moye, [place], MD  calcium-vitamin D (OSCAL WITH D) 500-200 MG-UNIT tablet Take 1 tablet by mouth.   Yes [provider]  clindamycin (CLINDAGEL) 1 % gel Apply 1 application topically daily. 10/16/20  Yes Malfi, Lupita Raider, FNP  doxycycline (PERIOSTAT) 20 MG tablet Take 1 tablet (20 mg total) by mouth daily. For acne. Take with food 12/30/20  Yes Moye, [place], MD  estradiol (ESTRACE) 0.5 MG tablet Take 1 tablet (0.5 mg total) by mouth daily. Patient taking differently: Take 0.5 mg by mouth 3 (three) times a week. 05/25/44  Yes Copland, Deirdre Evener, PA-C  levothyroxine (SYNTHROID) 125 MCG tablet Take 125 mcg by mouth daily before breakfast.   Yes [provider]   loratadine (CLARITIN) 10 MG tablet Take 10 mg by mouth daily.   Yes [provider]  Multiple Vitamins-Minerals (PRESERVISION AREDS 2 PO) Take 1 capsule by mouth 2 (two) times daily.    Yes [provider]  Probiotic Product (PROBIOTIC-10 PO) Take by mouth.   Yes [provider]  Vitamin D, Ergocalciferol, (DRISDOL) 1.25 MG (50000 UNIT) CAPS capsule Take 1 capsule (50,000 Units total) by mouth every 7 (seven) days. 04/13/21  Yes Beasley, Caren D, MD  acetaminophen (TYLENOL) 325 MG tablet Take 650 mg by mouth every 6 (six) hours as needed.    [provider]  ketoconazole (NIZORAL) 2 % cream Apply 1 application topically daily as needed for irritation. 10/16/20   Malfi, Lupita Raider, FNP  ketoconazole (NIZORAL) 2 % shampoo Massage into scalp and let sit 10 minutes before washing out. Use three times weekly. 12/30/20   Moye, [place], MD  NON FORMULARY Hypochlorous spray on face and back 2x daily    [provider]  valACYclovir (VALTREX) 1000 MG tablet Take 1,000 mg by mouth 2 (two) times daily as needed.     [provider]    Allergies as of 04/09/2021 - Review Complete 04/08/2021  Allergen Reaction Noted   Adhesive [tape]  07/24/2020   Cat hair extract  10/16/2020   Ginger Swelling  11/14/2018   Latex Itching 11/14/2004   Penicillin g Swelling 11/14/1965   Penicillins  07/24/2020   Wound dressing adhesive Itching and Swelling 11/14/2004    Family History  Problem Relation Age of Onset   Myasthenia gravis Mother    Heart disease Father    Depression Father    Skin cancer Father 74   Cancer Father    Anxiety disorder Father    Obesity Father    Breast cancer Maternal Grandmother 18   Skin cancer Maternal Grandmother 71   Breast cancer Cousin 60   Bone cancer Maternal Aunt 60   Melanoma Maternal Aunt 60    Social History   Socioeconomic History   Marital status: Married    Spouse name: Not on file   Number of children: Not on  file   Years of education: Not on file   Highest education level: Not on file  Occupational History   Occupation: Recreational therapist  Tobacco Use   Smoking status: Never   Smokeless tobacco: Never  Vaping Use   Vaping Use: Never used  Substance and Sexual Activity   Alcohol use: Yes   Drug use: Never   Sexual activity: Yes    Birth control/protection: Surgical    Comment: Hysterectomy  Other Topics Concern   Not on file  Social History Narrative   Not on file   Social Determinants of Health   Financial Resource Strain: Not on file  Food Insecurity: Not on file  Transportation Needs: Not on file  Physical Activity: Not on file  Stress: Not on file  Social Connections: Not on file  Intimate Partner Violence: Not on file    Review of Systems: See HPI, otherwise negative ROS  Physical Exam: BP 135/86   Pulse 77   Temp (!) 97.5 F (36.4 C) (Temporal)   Resp 18   Ht _0  (1.727 m)   Wt 96.2 kg   SpO2 100%   BMI 32.23 kg/m  General:   Alert,  pleasant and cooperative in NAD Head:  Normocephalic and atraumatic. Neck:  Supple; no masses or thyromegaly. Lungs:  Clear throughout to auscultation.    Heart:  Regular rate and rhythm. Abdomen:  Soft, nontender and nondistended. Normal bowel sounds, without guarding, and without rebound.   Neurologic:  Alert and  oriented x4;  grossly normal neurologically.  Impression/Plan: Jeanne Velez is here for an flexible sigmoidoscopy to be performed for rectal pressure, h/o colon polyps  Risks, benefits, limitations, and alternatives regarding  flexible sigmoidoscopy have been reviewed with the patient.  Questions have been answered.  All parties agreeable.   Sherri Sear, MD  04/28/2021, 9:38 AM

## 2021-04-29 ENCOUNTER — Encounter: Payer: Self-pay | Admitting: Gastroenterology

## 2021-05-03 ENCOUNTER — Encounter (INDEPENDENT_AMBULATORY_CARE_PROVIDER_SITE_OTHER): Payer: Self-pay | Admitting: Adult Health

## 2021-05-03 ENCOUNTER — Other Ambulatory Visit: Payer: Self-pay

## 2021-05-03 ENCOUNTER — Ambulatory Visit (INDEPENDENT_AMBULATORY_CARE_PROVIDER_SITE_OTHER): Payer: 59 | Admitting: Adult Health

## 2021-05-03 VITALS — BP 124/80 | HR 82 | Temp 98.2°F | Ht 68.0 in | Wt 212.0 lb

## 2021-05-03 DIAGNOSIS — Z8719 Personal history of other diseases of the digestive system: Secondary | ICD-10-CM

## 2021-05-03 DIAGNOSIS — E669 Obesity, unspecified: Secondary | ICD-10-CM

## 2021-05-03 DIAGNOSIS — E559 Vitamin D deficiency, unspecified: Secondary | ICD-10-CM

## 2021-05-03 DIAGNOSIS — Z6833 Body mass index (BMI) 33.0-33.9, adult: Secondary | ICD-10-CM | POA: Diagnosis not present

## 2021-05-03 DIAGNOSIS — Z9189 Other specified personal risk factors, not elsewhere classified: Secondary | ICD-10-CM

## 2021-05-03 MED ORDER — VITAMIN D (ERGOCALCIFEROL) 1.25 MG (50000 UNIT) PO CAPS: 50000.0000 [IU] | ORAL_CAPSULE | ORAL | 0 refills | Status: DC

## 2021-05-04 ENCOUNTER — Telehealth: Payer: Self-pay | Admitting: Gastroenterology

## 2021-05-04 DIAGNOSIS — E669 Obesity, unspecified: Secondary | ICD-10-CM | POA: Insufficient documentation

## 2021-05-04 DIAGNOSIS — E559 Vitamin D deficiency, unspecified: Secondary | ICD-10-CM | POA: Insufficient documentation

## 2021-05-04 DIAGNOSIS — Z6833 Body mass index (BMI) 33.0-33.9, adult: Secondary | ICD-10-CM | POA: Insufficient documentation

## 2021-05-04 DIAGNOSIS — E6609 Other obesity due to excess calories: Secondary | ICD-10-CM | POA: Insufficient documentation

## 2021-05-04 NOTE — Telephone Encounter (Signed)
Patient states she had a flexible sigmoidoscopy done on 04/28/2021. She states when the nurse brought her back in the room she started a IV in her right arm. She states when the IV was placed she told the nurse that does not feel right and im having a aching pain around the IV site. The nurse wiggle the needle around and re position the iv and asked her if it felt better. At that time she did not have the aching pain anymore. The nurse put her arm down and under the blanket. Shortly after she left she began to have that aching pain again. She states she looked for a call button but there was no call button. She states no one came to check on her for 45 minute. She states a man came back in 45 minutes to take her to the procedure. She informed him that she was having a aching pain around the IV site and and radiated to her elbow. She states it was swollen under the skin and looked like a softball she states. She states he took the IV out and moved the IV to the back of her hand. She states later that afternoon when she got home it was black and bruised the size of two half dollars. She states it was still swollen like a softball around the IV site and around her elbow. She states she iced it the first night and took Ibuprofen the first couple of days. She states now the swelling is gone and still has a little of bruising. She states now she just has a aching pain when you touch the area. She called last week patient advocates but no one call her back. Patient wants to know should she still having the pain when she touches the site and it that is policy after IV is place no one checks on the patient till they are ready to go to the procedure room.

## 2021-05-04 NOTE — Telephone Encounter (Signed)
Called patient to address her concerns regarding the IV site during sigmoidoscopy. It appears that the IV site was infiltrated that resulted in localized swelling and redness.  The swelling has almost resolved.  She still has the redness and discomfort at the site which is slowly improving.  I am not worried about cellulitis at this time with improvement in her symptoms.  Patient was only concerned about the wait time of 45 minutes when the first IV was placed and she felt it was not working but the IV fluids were running.  I did apologize to her for this experience and she was appreciative of it.  She did not have any concerns otherwise  Cephas Darby, MD [address]  [place]  [place], [place] [address]  Main: [phone]  Fax: [phone] Pager: [phone]

## 2021-05-04 NOTE — Telephone Encounter (Signed)
Patient having reaction at IV site from procedure.

## 2021-05-04 NOTE — Progress Notes (Signed)
Chief Complaint:   OBESITY Jeanne Velez is here to discuss her progress with her obesity treatment plan along with follow-up of her obesity related diagnoses. Jeanne Velez is on keeping a food journal and adhering to recommended goals of 1200-1400 calories and 80 g protein and states she is following her eating plan approximately 100% of the time. Jeanne Velez states she is doing yard work 120 minutes 5 times per week.  Today's visit was #: 3 Starting weight: 220 lbs Starting date: 03/30/2021 Today's weight: 212 lbs Today's date: 05/03/2021 Total lbs lost to date: 8 Total lbs lost since last in-office visit: 3  Interim History: Jeanne Velez tracks intake 100% of the time and also hits goals 100% of the time. She is down another 3 lbs for a total of 8 lbs!  On 04/28/2021, she underwent flexible sigmoidoscopy- study: normal.  Subjective:   1. Vitamin D deficiency Bridgit's Vitamin D level was 23.6 on 03/30/2021- well below goal of 50. She is currently taking prescription vitamin D 50,000 IU each week. She denies nausea, vomiting or muscle weakness.  2. History of rectal polyps On 04/28/2021, Jeanne Velez underwent flexible sigmoidoscopy- study: normal.  3. At risk for osteoporosis Jeanne Velez is at higher risk of osteopenia and osteoporosis due to Vitamin D deficiency and obesity.  Assessment/Plan:   1. Vitamin D deficiency Low Vitamin D level contributes to fatigue and are associated with obesity, breast, and colon cancer. She agrees to continue to take prescription Vitamin D @50 ,000 IU every week and will follow-up for routine testing of Vitamin D, at least 2-3 times per year to avoid over-replacement.  - Vitamin D, Ergocalciferol, (DRISDOL) 1.25 MG (50000 UNIT) CAPS capsule; Take 1 capsule (50,000 Units total) by mouth every 7 (seven) days.  Dispense: 4 capsule; Refill: 0  2. History of rectal polyps Follow up with GI as directed.  3. At risk for osteoporosis Jeanne Velez was given approximately 15 minutes of osteoporosis  prevention counseling today. Jeanne Velez is at risk for osteopenia and osteoporosis due to her Vitamin D deficiency. She was encouraged to take her Vitamin D and follow her higher calcium diet and increase strengthening exercise to help strengthen her bones and decrease her risk of osteopenia and osteoporosis.  Repetitive spaced learning was employed today to elicit superior memory formation and behavioral change.  4. Obesity with current BMI 32.3  Jeanne Velez is currently in the action stage of change. As such, her goal is to continue with weight loss efforts. She has agreed to keeping a food journal and adhering to recommended goals of 1200-1400 calories and 80 g protein.   Exercise goals:  As is  Behavioral modification strategies: increasing lean protein intake, decreasing simple carbohydrates, meal planning and cooking strategies, keeping healthy foods in the home, planning for success, and keeping a strict food journal.  Jeanne Velez has agreed to follow-up with our clinic in 2 weeks. She was informed of the importance of frequent follow-up visits to maximize her success with intensive lifestyle modifications for her multiple health conditions.   Objective:   Blood pressure 124/80, pulse 82, temperature 98.2 F (36.8 C), height 5\' 8"  (1.727 m), weight 212 lb (96.2 kg), SpO2 100 %. Body mass index is 32.23 kg/m.  General: Cooperative, alert, well developed, in no acute distress. HEENT: Conjunctivae and lids unremarkable. Cardiovascular: Regular rhythm.  Lungs: Normal work of breathing. Neurologic: No focal deficits.   Lab Results  Component Value Date   CREATININE 0.78 03/30/2021   BUN 13 03/30/2021   NA  140 03/30/2021   K 4.5 03/30/2021   CL 100 03/30/2021   CO2 23 03/30/2021   Lab Results  Component Value Date   ALT 17 03/30/2021   AST 15 03/30/2021   ALKPHOS 111 03/30/2021   BILITOT 0.9 03/30/2021   Lab Results  Component Value Date   HGBA1C 5.7 (H) 03/30/2021   HGBA1C 5.6 08/11/2020    Lab Results  Component Value Date   INSULIN 6.7 03/30/2021   Lab Results  Component Value Date   TSH 0.954 03/30/2021   Lab Results  Component Value Date   CHOL 199 03/30/2021   HDL 44 03/30/2021   LDLCALC 111 (H) 03/30/2021   TRIG 254 (H) 03/30/2021   CHOLHDL 4.5 (H) 07/28/2020   Lab Results  Component Value Date   WBC 6.9 03/30/2021   HGB 13.8 03/30/2021   HCT 40.9 03/30/2021   MCV 91 03/30/2021   PLT 247 10/10/2020   No results found for: IRON, TIBC, FERRITIN   Attestation Statements:   Reviewed by clinician on day of visit: allergies, medications, problem list, medical history, surgical history, family history, social history, and previous encounter notes.  Coral Ceo, CMA, am acting as transcriptionist for Jeanne Marble, NP.  I have reviewed the above documentation for accuracy and completeness, and I agree with the above. -  Grahm Etsitty d. Tarry Fountain, NP-C

## 2021-05-20 ENCOUNTER — Encounter (INDEPENDENT_AMBULATORY_CARE_PROVIDER_SITE_OTHER): Payer: Self-pay | Admitting: Physician Assistant

## 2021-05-20 ENCOUNTER — Other Ambulatory Visit: Payer: Self-pay

## 2021-05-20 ENCOUNTER — Ambulatory Visit (INDEPENDENT_AMBULATORY_CARE_PROVIDER_SITE_OTHER): Payer: 59 | Admitting: Physician Assistant

## 2021-05-20 VITALS — BP 118/78 | HR 78 | Temp 97.4°F | Ht 68.0 in | Wt 210.0 lb

## 2021-05-20 DIAGNOSIS — Z6833 Body mass index (BMI) 33.0-33.9, adult: Secondary | ICD-10-CM

## 2021-05-20 DIAGNOSIS — E669 Obesity, unspecified: Secondary | ICD-10-CM | POA: Diagnosis not present

## 2021-05-20 DIAGNOSIS — E559 Vitamin D deficiency, unspecified: Secondary | ICD-10-CM

## 2021-05-20 DIAGNOSIS — R7303 Prediabetes: Secondary | ICD-10-CM | POA: Diagnosis not present

## 2021-05-20 DIAGNOSIS — Z9189 Other specified personal risk factors, not elsewhere classified: Secondary | ICD-10-CM

## 2021-05-20 MED ORDER — VITAMIN D (ERGOCALCIFEROL) 1.25 MG (50000 UNIT) PO CAPS: 50000.0000 [IU] | ORAL_CAPSULE | ORAL | 0 refills | Status: DC

## 2021-05-27 NOTE — Progress Notes (Signed)
Chief Complaint:   OBESITY Jeanne Velez is here to discuss her progress with her obesity treatment plan along with follow-up of her obesity related diagnoses. Jeanne Velez is on keeping a food journal and adhering to recommended goals of 1200-1400 calories and 80 grams of protein daily and states she is following her eating plan approximately 85% of the time. Jeanne Velez states she is walking for 40-45 minutes and gardening and building for 2-4 hours 3-4 times per week.  Today's visit was #: 4 Starting weight: 220 lbs Starting date: 03/30/2021 Today's weight: 210 lbs Today's date: 05/20/2021 Total lbs lost to date: 10 Total lbs lost since last in-office visit: 2  Interim History: Jeanne Velez continues to do well with weight loss. She has been getting closer to 1400 calories daily. She is not hitting her protein goal daily.  Subjective:   1. Vitamin D deficiency Jeanne Velez is on Vit D weekly, and her energy has improved.  2. Pre-diabetes Jeanne Velez's last A1c was 5.7 and she is not on medications.  3. At risk for diabetes mellitus Jeanne Velez is at higher than average risk for developing diabetes due to obesity.   Assessment/Plan:   1. Vitamin D deficiency Low Vitamin D level contributes to fatigue and are associated with obesity, breast, and colon cancer. We will refill prescription Vitamin D for 1 month. Jeanne Velez will follow-up for routine testing of Vitamin D, at least 2-3 times per year to avoid over-replacement.  - Vitamin D, Ergocalciferol, (DRISDOL) 1.25 MG (50000 UNIT) CAPS capsule; Take 1 capsule (50,000 Units total) by mouth every 7 (seven) days.  Dispense: 4 capsule; Refill: 0  2. Pre-diabetes Jeanne Velez will continue her meal plan, exercise, and decreasing simple carbohydrates to help decrease the risk of diabetes.   3. At risk for diabetes mellitus Jeanne Velez was given approximately 15 minutes of diabetes education and counseling today. We discussed intensive lifestyle modifications today with an emphasis on weight loss as  well as increasing exercise and decreasing simple carbohydrates in her diet. We also reviewed medication options with an emphasis on risk versus benefit of those discussed.   Repetitive spaced learning was employed today to elicit superior memory formation and behavioral change.  4. Obesity with current BMI 31.94 Jeanne Velez is currently in the action stage of change. As such, her goal is to continue with weight loss efforts. She has agreed to keeping a food journal and adhering to recommended goals of 1200-1400 calories and 90 grams of protein daily.   Exercise goals: As is.  Behavioral modification strategies: increasing lean protein intake and meal planning and cooking strategies.  Jeanne Velez has agreed to follow-up with our clinic in 2 weeks. She was informed of the importance of frequent follow-up visits to maximize her success with intensive lifestyle modifications for her multiple health conditions.   Objective:   Blood pressure 118/78, pulse 78, temperature (!) 97.4 F (36.3 C), height 5\' 8"  (1.727 m), weight 210 lb (95.3 kg), SpO2 100 %. Body mass index is 31.93 kg/m.  General: Cooperative, alert, well developed, in no acute distress. HEENT: Conjunctivae and lids unremarkable. Cardiovascular: Regular rhythm.  Lungs: Normal work of breathing. Neurologic: No focal deficits.   Lab Results  Component Value Date   CREATININE 0.78 03/30/2021   BUN 13 03/30/2021   NA 140 03/30/2021   K 4.5 03/30/2021   CL 100 03/30/2021   CO2 23 03/30/2021   Lab Results  Component Value Date   ALT 17 03/30/2021   AST 15 03/30/2021  ALKPHOS 111 03/30/2021   BILITOT 0.9 03/30/2021   Lab Results  Component Value Date   HGBA1C 5.7 (H) 03/30/2021   HGBA1C 5.6 08/11/2020   Lab Results  Component Value Date   INSULIN 6.7 03/30/2021   Lab Results  Component Value Date   TSH 0.954 03/30/2021   Lab Results  Component Value Date   CHOL 199 03/30/2021   HDL 44 03/30/2021   LDLCALC 111 (H)  03/30/2021   TRIG 254 (H) 03/30/2021   CHOLHDL 4.5 (H) 07/28/2020   Lab Results  Component Value Date   VD25OH 23.6 (L) 03/30/2021   Lab Results  Component Value Date   WBC 6.9 03/30/2021   HGB 13.8 03/30/2021   HCT 40.9 03/30/2021   MCV 91 03/30/2021   PLT 247 10/10/2020   No results found for: IRON, TIBC, FERRITIN  Attestation Statements:   Reviewed by clinician on day of visit: allergies, medications, problem list, medical history, surgical history, family history, social history, and previous encounter notes.   Wilhemena Durie, am acting as transcriptionist for [employer], PA-C.  I have reviewed the above documentation for accuracy and completeness, and I agree with the above. Abby Potash, PA-C

## 2021-05-31 ENCOUNTER — Other Ambulatory Visit: Payer: Self-pay

## 2021-05-31 DIAGNOSIS — L219 Seborrheic dermatitis, unspecified: Secondary | ICD-10-CM

## 2021-05-31 MED ORDER — DOXYCYCLINE HYCLATE 20 MG PO TABS: 20.0000 mg | ORAL_TABLET | Freq: Every day | ORAL | 6 refills | Status: DC

## 2021-06-01 NOTE — Progress Notes (Signed)
Jearld Fenton, NP   Chief Complaint  Patient presents with   Vaginal Pain    During intercourse     HPI:      [name] is a 60 y.o. [phone] whose LMP was No LMP recorded. Patient has had a hysterectomy., presents today for vaginal pain for the past few months. Sx feel more muscular in that it is a deep ache, closer to posterior aspect of vag canal. Notices sx with walking, sitting up from stretching on mat, after hard BM.  No vag d/c, odor, irritation. No urin or fecal incont. Thought sx were rectal so saw GI. Had neg eval, including neg flex sig. Does have issues with constipation due to diet change for wt loss. Has to use miralax a few times wkly. Also with SUI issues, no OAB/urge incont. No longer doing kegel exercises, but used to do them in past.  She is not usually sex active but tried this past wknd and had same muscular pain. Was not a dryness issue.  Her menses are absent due to TAH/BSO due bilat dermoids/ovar cysts. She does have vasomotor sx and is currently on estradiol 0.5 mg 3 times wkly.    Past Medical History:  Diagnosis Date   Acne    Allergy    Anemia    Back pain    BRCA negative 07/2020   MyRisk neg   Cataracts, bilateral    Colon polyp    Constipation    Family history of breast cancer 07/2020   FHLK=56.2%/BWLSLHTDS=2.8%   Folliculitis    GERD (gastroesophageal reflux disease)    Hyperlipidemia    Hypothyroidism    Joint pain    Macular degeneration    Osteoarthritis    Retinal vasculitis    Stress    Thyroid cancer ([place] [address])     Past Surgical History:  Procedure Laterality Date   BREAST BIOPSY     FLEXIBLE SIGMOIDOSCOPY N/A 04/28/2021   Procedure: FLEXIBLE SIGMOIDOSCOPY;  Surgeon: Lin Landsman, MD;  Location: [facility] ENDOSCOPY;  Service: Gastroenterology;  Laterality: N/A;   ovarial cystectomy     THYROIDECTOMY     TOTAL ABDOMINAL HYSTERECTOMY W/ BILATERAL SALPINGOOPHORECTOMY     dermoids, ovar cysts   TUBAL LIGATION       Family History  Problem Relation Age of Onset   Myasthenia gravis Mother    Heart disease Father    Depression Father    Skin cancer Father 53   Cancer Father    Anxiety disorder Father    Obesity Father    Breast cancer Maternal Grandmother 103   Skin cancer Maternal Grandmother 62   Breast cancer Cousin 60   Bone cancer Maternal Aunt 60   Melanoma Maternal Aunt 60    Social History   Socioeconomic History   Marital status: Married    Spouse name: Not on file   Number of children: Not on file   Years of education: Not on file   Highest education level: Not on file  Occupational History   Occupation: Recreational therapist  Tobacco Use   Smoking status: Never   Smokeless tobacco: Never  Vaping Use   Vaping Use: Never used  Substance and Sexual Activity   Alcohol use: Yes   Drug use: Never   Sexual activity: Yes    Birth control/protection: Surgical    Comment: Hysterectomy  Other Topics Concern   Not on file  Social History Narrative   Not on file  Social Determinants of Health   Financial Resource Strain: Not on file  Food Insecurity: Not on file  Transportation Needs: Not on file  Physical Activity: Not on file  Stress: Not on file  Social Connections: Not on file  Intimate Partner Violence: Not on file    Outpatient Medications Prior to Visit  Medication Sig Dispense Refill   acetaminophen (TYLENOL) 325 MG tablet Take 650 mg by mouth every 6 (six) hours as needed.     calcium-vitamin D (OSCAL WITH D) 500-200 MG-UNIT tablet Take 1 tablet by mouth.     clindamycin (CLINDAGEL) 1 % gel Apply 1 application topically daily. 30 g 5   doxycycline (PERIOSTAT) 20 MG tablet Take 1 tablet (20 mg total) by mouth daily. For acne. Take with food 30 tablet 6   estradiol (ESTRACE) 0.5 MG tablet Take 1 tablet (0.5 mg total) by mouth daily. 90 tablet 3   ketoconazole (NIZORAL) 2 % cream Apply 1 application topically daily as needed for irritation. 15 g 5    levothyroxine (SYNTHROID) 125 MCG tablet Take 125 mcg by mouth daily before breakfast.     loratadine (CLARITIN) 10 MG tablet Take 10 mg by mouth daily.     Multiple Vitamins-Minerals (PRESERVISION AREDS 2 PO) Take 1 capsule by mouth 2 (two) times daily.      NON FORMULARY Hypochlorous spray on face and back 2x daily     Probiotic Product (PROBIOTIC-10 PO) Take by mouth.     valACYclovir (VALTREX) 1000 MG tablet Take 1,000 mg by mouth 2 (two) times daily as needed.      Vitamin D, Ergocalciferol, (DRISDOL) 1.25 MG (50000 UNIT) CAPS capsule Take 1 capsule (50,000 Units total) by mouth every 7 (seven) days. 4 capsule 0   Adapalene (DIFFERIN) 0.3 % gel Apply 1 application topically at bedtime. 45 g 11   ketoconazole (NIZORAL) 2 % shampoo Massage into scalp and let sit 10 minutes before washing out. Use three times weekly. 120 mL 11   No facility-administered medications prior to visit.      ROS:  Review of Systems  Constitutional:  Negative for fever.  Gastrointestinal:  Negative for blood in stool, constipation, diarrhea, nausea and vomiting.  Genitourinary:  Positive for vaginal pain. Negative for dyspareunia, dysuria, flank pain, frequency, hematuria, urgency, vaginal bleeding and vaginal discharge.  Musculoskeletal:  Negative for back pain.  Skin:  Negative for rash.  BREAST: No symptoms   OBJECTIVE:   Vitals:  BP 110/70   Ht 5' 8" (1.727 m)   Wt 213 lb (96.6 kg)   BMI 32.39 kg/m   Physical Exam Vitals reviewed.  Constitutional:      Appearance: She is well-developed.  Pulmonary:     Effort: Pulmonary effort is normal.  Genitourinary:    General: Normal vulva.     Pubic Area: No rash.      Labia:        Right: No rash, tenderness or lesion.        Left: No rash, tenderness or lesion.      Vagina: Normal. No vaginal discharge, erythema or tenderness.     Uterus: Absent. Not tender.      Adnexa: Right adnexa normal and left adnexa normal.       Right: No mass or  tenderness.         Left: No mass or tenderness.       Comments: BARELY GRADE 1 CYSTOCELE WITH VALSALVA BUT DOES HAVE PELVIC FLOOR RELAXATION; NEG  VAG EXAM OTHERWISE Musculoskeletal:        General: Normal range of motion.     Cervical back: Normal range of motion.  Skin:    General: Skin is warm and dry.  Neurological:     General: No focal deficit present.     Mental Status: She is alert and oriented to person, place, and time.  Psychiatric:        Mood and Affect: Mood normal.        Behavior: Behavior normal.        Thought Content: Thought content normal.        Judgment: Judgment normal.    Assessment/Plan: Vaginal pain--neg GYN exam except mild cystocele and pelvic floor dysfunction. Vag tissue looks healthy.  Add vag ERT 1 g QHS for 8 days (2 samples prem vag crm given). Resume kegels. If sx persist after a couple wks, will do ref to pelvic PT. Sx most likely MSK. F/u prn.   Pelvic floor relaxation  Female cystocele    Return if symptoms worsen or fail to improve.  Alicia B. Copland, PA-C 06/02/2021 12:11 PM

## 2021-06-02 ENCOUNTER — Ambulatory Visit (INDEPENDENT_AMBULATORY_CARE_PROVIDER_SITE_OTHER): Payer: 59 | Admitting: Obstetrics and Gynecology

## 2021-06-02 ENCOUNTER — Other Ambulatory Visit: Payer: Self-pay

## 2021-06-02 ENCOUNTER — Encounter: Payer: Self-pay | Admitting: Obstetrics and Gynecology

## 2021-06-02 ENCOUNTER — Ambulatory Visit (INDEPENDENT_AMBULATORY_CARE_PROVIDER_SITE_OTHER): Payer: 59 | Admitting: Physician Assistant

## 2021-06-02 VITALS — BP 110/70 | Ht 68.0 in | Wt 213.0 lb

## 2021-06-02 VITALS — BP 124/76 | HR 82 | Temp 98.4°F | Ht 68.0 in | Wt 208.0 lb

## 2021-06-02 DIAGNOSIS — E559 Vitamin D deficiency, unspecified: Secondary | ICD-10-CM

## 2021-06-02 DIAGNOSIS — E669 Obesity, unspecified: Secondary | ICD-10-CM

## 2021-06-02 DIAGNOSIS — R102 Pelvic and perineal pain: Secondary | ICD-10-CM | POA: Diagnosis not present

## 2021-06-02 DIAGNOSIS — Z9189 Other specified personal risk factors, not elsewhere classified: Secondary | ICD-10-CM | POA: Diagnosis not present

## 2021-06-02 DIAGNOSIS — Z6833 Body mass index (BMI) 33.0-33.9, adult: Secondary | ICD-10-CM | POA: Diagnosis not present

## 2021-06-02 DIAGNOSIS — N8189 Other female genital prolapse: Secondary | ICD-10-CM | POA: Diagnosis not present

## 2021-06-02 DIAGNOSIS — N8110 Cystocele, unspecified: Secondary | ICD-10-CM | POA: Insufficient documentation

## 2021-06-02 DIAGNOSIS — E782 Mixed hyperlipidemia: Secondary | ICD-10-CM

## 2021-06-02 MED ORDER — VITAMIN D (ERGOCALCIFEROL) 1.25 MG (50000 UNIT) PO CAPS: 50000.0000 [IU] | ORAL_CAPSULE | ORAL | 0 refills | Status: DC

## 2021-06-09 NOTE — Progress Notes (Signed)
Chief Complaint:   OBESITY Dalayla is here to discuss her progress with her obesity treatment plan along with follow-up of her obesity related diagnoses. Lilyian is on keeping a food journal and adhering to recommended goals of 1200-1400 calories and 90 grams of protein daily and states she is following her eating plan approximately 95-98% of the time. Kelsy states she is walking for 35 minutes 5 times per week, strengthening for 15-20 minutes 2 times per week, and yoga for 20-30 minutes 1-2 times per week.  Today's visit was #: 5 Starting weight: 220 lbs Starting date: 03/30/2021 Today's weight: 208 lbs Today's date: 06/02/2021 Total lbs lost to date: 12 Total lbs lost since last in-office visit: 2  Interim History: Adeola continues to do well with weight loss. She is getting tired of having to think about food all day. She is experiencing an egg aversion currently. Since her last visit she has done a good job of increasing her protein.  Subjective:   1. Vitamin D deficiency Georganna is on Vit D weekly, and she is tolerating well.  2. Mixed hyperlipidemia Megin is not on medications, and her last lipid panel was not at goal.  3. At risk for heart disease Moksha is at a higher than average risk for cardiovascular disease due to obesity.   Assessment/Plan:   1. Vitamin D deficiency Low Vitamin D level contributes to fatigue and are associated with obesity, breast, and colon cancer. We will refill prescription Vitamin D for 1 month. Melynn will follow-up for routine testing of Vitamin D, at least 2-3 times per year to avoid over-replacement.  - Vitamin D, Ergocalciferol, (DRISDOL) 1.25 MG (50000 UNIT) CAPS capsule; Take 1 capsule (50,000 Units total) by mouth every 7 (seven) days.  Dispense: 4 capsule; Refill: 0  2. Mixed hyperlipidemia Cardiovascular risk and specific lipid/LDL goals reviewed. We discussed several lifestyle modifications today. We will recheck labs in the next 2 months. Firdaus  will continue her meal plan, exercise and weight loss efforts. Orders and follow up as documented in patient record.   Counseling Intensive lifestyle modifications are the first line treatment for this issue. Dietary changes: Increase soluble fiber. Decrease simple carbohydrates. Exercise changes: Moderate to vigorous-intensity aerobic activity 150 minutes per week if tolerated. Lipid-lowering medications: see documented in medical record.  3. At risk for heart disease Solanch was given approximately 15 minutes of coronary artery disease prevention counseling today. She is 60 y.o. female and has risk factors for heart disease including obesity. We discussed intensive lifestyle modifications today with an emphasis on specific weight loss instructions and strategies.   Repetitive spaced learning was employed today to elicit superior memory formation and behavioral change.  4. Obesity with current BMI 31.63 Ying is currently in the action stage of change. As such, her goal is to continue with weight loss efforts. She has agreed to keeping a food journal and adhering to recommended goals of 1200-1400 calories and 90 grams of protein daily.   Exercise goals: As is.  Behavioral modification strategies: meal planning and cooking strategies and keeping healthy foods in the home.  Cate has agreed to follow-up with our clinic in 2 weeks. She was informed of the importance of frequent follow-up visits to maximize her success with intensive lifestyle modifications for her multiple health conditions.   Objective:   Blood pressure 124/76, pulse 82, temperature 98.4 F (36.9 C), height '5\' 8"'$  (1.727 m), weight 208 lb (94.3 kg), SpO2 100 %. Body mass  index is 31.63 kg/m.  General: Cooperative, alert, well developed, in no acute distress. HEENT: Conjunctivae and lids unremarkable. Cardiovascular: Regular rhythm.  Lungs: Normal work of breathing. Neurologic: No focal deficits.   Lab Results  Component  Value Date   CREATININE 0.78 03/30/2021   BUN 13 03/30/2021   NA 140 03/30/2021   K 4.5 03/30/2021   CL 100 03/30/2021   CO2 23 03/30/2021   Lab Results  Component Value Date   ALT 17 03/30/2021   AST 15 03/30/2021   ALKPHOS 111 03/30/2021   BILITOT 0.9 03/30/2021   Lab Results  Component Value Date   HGBA1C 5.7 (H) 03/30/2021   HGBA1C 5.6 08/11/2020   Lab Results  Component Value Date   INSULIN 6.7 03/30/2021   Lab Results  Component Value Date   TSH 0.954 03/30/2021   Lab Results  Component Value Date   CHOL 199 03/30/2021   HDL 44 03/30/2021   LDLCALC 111 (H) 03/30/2021   TRIG 254 (H) 03/30/2021   CHOLHDL 4.5 (H) 07/28/2020   Lab Results  Component Value Date   VD25OH 23.6 (L) 03/30/2021   Lab Results  Component Value Date   WBC 6.9 03/30/2021   HGB 13.8 03/30/2021   HCT 40.9 03/30/2021   MCV 91 03/30/2021   PLT 247 10/10/2020   No results found for: IRON, TIBC, FERRITIN  Attestation Statements:   Reviewed by clinician on day of visit: allergies, medications, problem list, medical history, surgical history, family history, social history, and previous encounter notes.   Wilhemena Durie, am acting as transcriptionist for [employer], PA-C.  I have reviewed the above documentation for accuracy and completeness, and I agree with the above. Abby Potash, PA-C

## 2021-06-16 ENCOUNTER — Ambulatory Visit (INDEPENDENT_AMBULATORY_CARE_PROVIDER_SITE_OTHER): Payer: 59 | Admitting: Adult Health

## 2021-06-16 ENCOUNTER — Encounter (INDEPENDENT_AMBULATORY_CARE_PROVIDER_SITE_OTHER): Payer: Self-pay

## 2021-06-24 ENCOUNTER — Other Ambulatory Visit: Payer: Self-pay

## 2021-06-24 ENCOUNTER — Encounter (INDEPENDENT_AMBULATORY_CARE_PROVIDER_SITE_OTHER): Payer: Self-pay | Admitting: Adult Health

## 2021-06-24 ENCOUNTER — Ambulatory Visit (INDEPENDENT_AMBULATORY_CARE_PROVIDER_SITE_OTHER): Payer: 59 | Admitting: Adult Health

## 2021-06-24 VITALS — BP 122/80 | HR 78 | Temp 98.6°F | Ht 68.0 in | Wt 208.0 lb

## 2021-06-24 DIAGNOSIS — E669 Obesity, unspecified: Secondary | ICD-10-CM

## 2021-06-24 DIAGNOSIS — E559 Vitamin D deficiency, unspecified: Secondary | ICD-10-CM

## 2021-06-24 DIAGNOSIS — R7303 Prediabetes: Secondary | ICD-10-CM | POA: Diagnosis not present

## 2021-06-24 DIAGNOSIS — Z6833 Body mass index (BMI) 33.0-33.9, adult: Secondary | ICD-10-CM | POA: Diagnosis not present

## 2021-06-24 DIAGNOSIS — Z9189 Other specified personal risk factors, not elsewhere classified: Secondary | ICD-10-CM

## 2021-06-26 NOTE — Progress Notes (Signed)
Chief Complaint:   OBESITY Jeanne Velez is here to discuss her progress with her obesity treatment plan along with follow-up of her obesity related diagnoses. Jeanne Velez is on keeping a food journal and adhering to recommended goals of 1200-1400 calories and 90 protein and states she is following her eating plan approximately 50% of the time. Jeanne Velez states she is doing cardio 30 minutes 7 times per week.  Today's visit was #: 6 Starting weight: 220 lbs Starting date: 03/30/2021 Today's weight: 208 lbs Today's date: 06/24/2021 Total lbs lost to date: 12 lbs Total lbs lost since last in-office visit: 0 lbs  Interim History: Jeanne Velez estimates to have tracked intake 100 % of the time and will hit goals 50 % of the time:  13 days - 1200-1400  cal, 6 days - 1500-1600 cal, and 3 days - 1700-1800 cal  17 days - 80 g of protein daily  Subjective:   1. Vitamin D deficiency She is currently taking prescription vitamin D 50,000 IU each week. She denies nausea, vomiting or muscle weakness. Vitamin D level from 03/30/21 is 23.6.  Lab Results  Component Value Date   VD25OH 23.6 (L) 03/30/2021   2. Pre-diabetes Jeanne Velez has a diagnosis of prediabetes based on her elevated HgA1c and was informed this puts her at greater risk of developing diabetes. She continues to work on diet and exercise to decrease her risk of diabetes. She denies nausea or hypoglycemia. Blood glucose from 03/30/21 is 93, within normal limits.  A1C is 5.7 and elevated and insulin at 6.7  Lab Results  Component Value Date   HGBA1C 5.7 (H) 03/30/2021   Lab Results  Component Value Date   INSULIN 6.7 03/30/2021   3. At risk for diabetes mellitus Jeanne Velez is at higher than average risk for developing diabetes due to obesity.    Assessment/Plan:   1. Vitamin D deficiency Low Vitamin D level contributes to fatigue and are associated with obesity, breast, and colon cancer. She agrees to continue to take prescription Vitamin D '@50'$ ,000 IU every week  and will follow-up for routine testing of Vitamin D, at least 2-3 times per year to avoid over-replacement.  2. Pre-diabetes Jeanne Velez will continue to work on weight loss, exercise, and decreasing simple carbohydrates to help decrease the risk of diabetes.   Jeanne Velez has agreed to regular exercise and increase protein.  3. At risk for diabetes mellitus Jeanne Velez was given approximately 15 minutes of diabetes education and counseling today. We discussed intensive lifestyle modifications today with an emphasis on weight loss as well as increasing exercise and decreasing simple carbohydrates in her diet. We also reviewed medication options with an emphasis on risk versus benefit of those discussed.   Repetitive spaced learning was employed today to elicit superior memory formation and behavioral change.   4. Obesity with current BMI 31.7 Jeanne Velez is currently in the action stage of change. As such, her goal is to continue with weight loss efforts. She has agreed to keeping a food journal and adhering to recommended goals of 1200-1400 calories and 90 g of  protein.  Fasting labs to be drawn at next office visit.  Exercise goals: As is.  Behavioral modification strategies: increasing lean protein intake, decreasing simple carbohydrates, meal planning and cooking strategies, keeping healthy foods in the home, and planning for success.  Jeanne Velez has agreed to follow-up with our clinic in 3 weeks. She was informed of the importance of frequent follow-up visits to maximize her success with intensive  lifestyle modifications for her multiple health conditions.   Objective:   Blood pressure 122/80, pulse 78, temperature 98.6 F (37 C), height '5\' 8"'$  (1.727 m), weight 208 lb (94.3 kg), SpO2 98 %. Body mass index is 31.63 kg/m.  General: Cooperative, alert, well developed, in no acute distress. HEENT: Conjunctivae and lids unremarkable. Cardiovascular: Regular rhythm.  Lungs: Normal work of breathing. Neurologic: No  focal deficits.   Lab Results  Component Value Date   CREATININE 0.78 03/30/2021   BUN 13 03/30/2021   NA 140 03/30/2021   K 4.5 03/30/2021   CL 100 03/30/2021   CO2 23 03/30/2021   Lab Results  Component Value Date   ALT 17 03/30/2021   AST 15 03/30/2021   ALKPHOS 111 03/30/2021   BILITOT 0.9 03/30/2021   Lab Results  Component Value Date   HGBA1C 5.7 (H) 03/30/2021   HGBA1C 5.6 08/11/2020   Lab Results  Component Value Date   INSULIN 6.7 03/30/2021   Lab Results  Component Value Date   TSH 0.954 03/30/2021   Lab Results  Component Value Date   CHOL 199 03/30/2021   HDL 44 03/30/2021   LDLCALC 111 (H) 03/30/2021   TRIG 254 (H) 03/30/2021   CHOLHDL 4.5 (H) 07/28/2020   Lab Results  Component Value Date   VD25OH 23.6 (L) 03/30/2021   Lab Results  Component Value Date   WBC 6.9 03/30/2021   HGB 13.8 03/30/2021   HCT 40.9 03/30/2021   MCV 91 03/30/2021   PLT 247 10/10/2020   No results found for: IRON, TIBC, FERRITIN  Attestation Statements:   Reviewed by clinician on day of visit: allergies, medications, problem list, medical history, surgical history, family history, social history, and previous encounter notes.  ILennette Bihari, CMA, am acting as transcriptionist for Mina Marble, NP-C  I have reviewed the above documentation for accuracy and completeness, and I agree with the above. -  Ladarian Bonczek d. Erek Kowal, NP-C

## 2021-06-28 DIAGNOSIS — R7303 Prediabetes: Secondary | ICD-10-CM | POA: Insufficient documentation

## 2021-07-12 ENCOUNTER — Other Ambulatory Visit (INDEPENDENT_AMBULATORY_CARE_PROVIDER_SITE_OTHER): Payer: Self-pay | Admitting: Adult Health

## 2021-07-12 DIAGNOSIS — E559 Vitamin D deficiency, unspecified: Secondary | ICD-10-CM

## 2021-07-12 NOTE — Telephone Encounter (Signed)
LAST APPOINTMENT DATE: 06/24/21 NEXT APPOINTMENT DATE: 07/22/21   [facility]  ([place]) - [place], [place] [facility] [address] [place] [address] [place] [address] Phone: [phone] Fax: [phone]  [id number] [address]: [phone]: [address]#[id number][address] [address] [place]: [phone]: [phone] Patient is requesting a refill of the following medications: Pending Prescriptions:                       Disp   Refills   Vitamin D, Ergocalciferol, (DRISDOL) 1.25 *4 caps*0       Sig: Take 1 capsule (50,000 Units total) by mouth every 7          (seven) days.   Date last filled: 06/02/21 [facility] Lab Results      Component                Value               Date                      HGBA1C                   5.7 (H)             03/30/2021                HGBA1C                   5.6                 08/11/2020           Lab Results      Component                Value               Date                      LDLCALC                  111 (H)             03/30/2021                CREATININE               0.78                03/30/2021           Lab Results      Component                Value               Date                      VD25OH                   23.6 (L)            03/30/2021            BP Readings from Last 3 Encounters: 06/24/21 : 122/80 06/02/21 : 124/76  06/02/21 : 110/70 

## 2021-07-12 NOTE — Telephone Encounter (Signed)
Pt last seen by Katy Danford, FNP.  

## 2021-07-22 ENCOUNTER — Encounter (INDEPENDENT_AMBULATORY_CARE_PROVIDER_SITE_OTHER): Payer: Self-pay | Admitting: Adult Health

## 2021-07-22 ENCOUNTER — Ambulatory Visit (INDEPENDENT_AMBULATORY_CARE_PROVIDER_SITE_OTHER): Payer: 59 | Admitting: Adult Health

## 2021-07-22 ENCOUNTER — Other Ambulatory Visit: Payer: Self-pay

## 2021-07-22 VITALS — BP 124/78 | HR 80 | Temp 98.1°F | Ht 68.0 in | Wt 204.0 lb

## 2021-07-22 DIAGNOSIS — R7303 Prediabetes: Secondary | ICD-10-CM

## 2021-07-22 DIAGNOSIS — E669 Obesity, unspecified: Secondary | ICD-10-CM | POA: Diagnosis not present

## 2021-07-22 DIAGNOSIS — E559 Vitamin D deficiency, unspecified: Secondary | ICD-10-CM

## 2021-07-22 DIAGNOSIS — E782 Mixed hyperlipidemia: Secondary | ICD-10-CM

## 2021-07-22 DIAGNOSIS — Z9189 Other specified personal risk factors, not elsewhere classified: Secondary | ICD-10-CM

## 2021-07-22 DIAGNOSIS — Z6833 Body mass index (BMI) 33.0-33.9, adult: Secondary | ICD-10-CM

## 2021-07-22 MED ORDER — VITAMIN D (ERGOCALCIFEROL) 1.25 MG (50000 UNIT) PO CAPS: 50000.0000 [IU] | ORAL_CAPSULE | ORAL | 0 refills | Status: DC

## 2021-07-22 NOTE — Progress Notes (Signed)
Chief Complaint:   OBESITY Jeanne Velez is here to discuss her progress with her obesity treatment plan along with follow-up of her obesity related diagnoses. Jeanne Velez is on keeping a food journal and adhering to recommended goals of 1200-1400 calories and 90 grams of protein and states she is following her eating plan approximately 85% of the time. Jeanne Velez states she is walking/gardening for 30-60 minutes 6 times per week.  Today's visit was #: 7 Starting weight: 220 lbs Starting date: 03/30/2021 Today's weight: 204 lbs Today's date: 07/22/2021 Total lbs lost to date: 16 lbs Total lbs lost since last in-office visit: 4 lbs  Interim History:  Jeanne Velez says that her average calories are 1200-1450 most days With 2 days 1500-1600; 1 day 2000.   Average protein:  >90 grams with 2 days >70 grams. She has decreased her pant size by 1-2 sizes and is now able to walk briskly.  Subjective:   1. Vitamin D deficiency On 03/30/2021, vitamin D level was 23.6.  She is currently taking prescription ergocalciferol 50,000 IU each week. She denies nausea, vomiting or muscle weakness.  Lab Results  Component Value Date   VD25OH 23.6 (L) 03/30/2021   2. Pre-diabetes On 03/30/2021, A1c 5.7 with normal BG. Only slightly elevated.  Lab Results  Component Value Date   HGBA1C 5.7 (H) 03/30/2021   Lab Results  Component Value Date   INSULIN 6.7 03/30/2021   3. Mixed hyperlipidemia She denies family history of hyperlipidemia. She denies tobacco/vape use.  Lab Results  Component Value Date   ALT 17 03/30/2021   AST 15 03/30/2021   ALKPHOS 111 03/30/2021   BILITOT 0.9 03/30/2021   Lab Results  Component Value Date   CHOL 199 03/30/2021   HDL 44 03/30/2021   LDLCALC 111 (H) 03/30/2021   TRIG 254 (H) 03/30/2021   CHOLHDL 4.5 (H) 07/28/2020   4. At risk for diabetes mellitus Jeanne Velez is at higher than average risk for developing diabetes due to prediabetes and obesity.    Assessment/Plan:   1. Vitamin D  deficiency Low Vitamin D level contributes to fatigue and are associated with obesity, breast, and colon cancer. She agrees to continue to take prescription ergocalciferol '@50'$ ,000 IU every week.  Check vitamin D level today.  - VITAMIN D 25 Hydroxy (Vit-D Deficiency, Fractures) - Refill Vitamin D, Ergocalciferol, (DRISDOL) 1.25 MG (50000 UNIT) CAPS capsule; Take 1 capsule (50,000 Units total) by mouth every 7 (seven) days.  Dispense: 4 capsule; Refill: 0  2. Pre-diabetes Jeanne Velez will continue to work on weight loss, exercise, and decreasing simple carbohydrates to help decrease the risk of diabetes.  Check labs today.  - Comprehensive metabolic panel - Hemoglobin A1c - Insulin, random  3. Mixed hyperlipidemia Cardiovascular risk and specific lipid/LDL goals reviewed.  We discussed several lifestyle modifications today and Jeanne Velez will continue to work on diet, exercise and weight loss efforts. Orders and follow up as documented in patient record.  Check labs today.  - Lipid panel  4. At risk for diabetes mellitus Jeanne Velez was given approximately 15 minutes of diabetes education and counseling today. We discussed intensive lifestyle modifications today with an emphasis on weight loss as well as increasing exercise and decreasing simple carbohydrates in her diet. We also reviewed medication options with an emphasis on risk versus benefit of those discussed.   Repetitive spaced learning was employed today to elicit superior memory formation and behavioral change.   5. Obesity with current BMI 31.7  Jeanne Velez is  currently in the action stage of change. As such, her goal is to continue with weight loss efforts. She has agreed to keeping a food journal and adhering to recommended goals of 1200-1400 calories and 90 grams of protein.   Exercise goals:  As is.  Behavioral modification strategies: increasing lean protein intake, decreasing simple carbohydrates, meal planning and cooking strategies, keeping  healthy foods in the home, planning for success, and keeping a strict food journal.  Jeanne Velez has agreed to follow-up with our clinic in 3 weeks. She was informed of the importance of frequent follow-up visits to maximize her success with intensive lifestyle modifications for her multiple health conditions.   Jeanne Velez was informed we would discuss her lab results at her next visit unless there is a critical issue that needs to be addressed sooner. Jeanne Velez agreed to keep her next visit at the agreed upon time to discuss these results.  Objective:   Blood pressure 124/78, pulse 80, temperature 98.1 F (36.7 C), height '5\' 8"'$  (1.727 m), weight 204 lb (92.5 kg), SpO2 99 %. Body mass index is 31.02 kg/m.  General: Cooperative, alert, well developed, in no acute distress. HEENT: Conjunctivae and lids unremarkable. Cardiovascular: Regular rhythm.  Lungs: Normal work of breathing. Neurologic: No focal deficits.   Lab Results  Component Value Date   CREATININE 0.78 03/30/2021   BUN 13 03/30/2021   NA 140 03/30/2021   K 4.5 03/30/2021   CL 100 03/30/2021   CO2 23 03/30/2021   Lab Results  Component Value Date   ALT 17 03/30/2021   AST 15 03/30/2021   ALKPHOS 111 03/30/2021   BILITOT 0.9 03/30/2021   Lab Results  Component Value Date   HGBA1C 5.7 (H) 03/30/2021   HGBA1C 5.6 08/11/2020   Lab Results  Component Value Date   INSULIN 6.7 03/30/2021   Lab Results  Component Value Date   TSH 0.954 03/30/2021   Lab Results  Component Value Date   CHOL 199 03/30/2021   HDL 44 03/30/2021   LDLCALC 111 (H) 03/30/2021   TRIG 254 (H) 03/30/2021   CHOLHDL 4.5 (H) 07/28/2020   Lab Results  Component Value Date   VD25OH 23.6 (L) 03/30/2021   Lab Results  Component Value Date   WBC 6.9 03/30/2021   HGB 13.8 03/30/2021   HCT 40.9 03/30/2021   MCV 91 03/30/2021   PLT 247 10/10/2020   Attestation Statements:   Reviewed by clinician on day of visit: allergies, medications, problem list,  medical history, surgical history, family history, social history, and previous encounter notes.  I, Water quality scientist, CMA, am acting as Location manager for Mina Marble, NP.  I have reviewed the above documentation for accuracy and completeness, and I agree with the above. -  Kolbee Bogusz d. Roey Coopman, NP-C

## 2021-07-23 LAB — COMPREHENSIVE METABOLIC PANEL
ALT: 15 IU/L (ref 0–32)
AST: 18 IU/L (ref 0–40)
Albumin/Globulin Ratio: 1.8 (ref 1.2–2.2)
Albumin: 4.6 g/dL (ref 3.8–4.9)
Alkaline Phosphatase: 97 IU/L (ref 44–121)
BUN/Creatinine Ratio: 23 (ref 9–23)
BUN: 17 mg/dL (ref 6–24)
Bilirubin Total: 0.8 mg/dL (ref 0.0–1.2)
CO2: 25 mmol/L (ref 20–29)
Calcium: 9.2 mg/dL (ref 8.7–10.2)
Chloride: 101 mmol/L (ref 96–106)
Creatinine, Ser: 0.74 mg/dL (ref 0.57–1.00)
Globulin, Total: 2.6 g/dL (ref 1.5–4.5)
Glucose: 91 mg/dL (ref 65–99)
Potassium: 4.4 mmol/L (ref 3.5–5.2)
Sodium: 141 mmol/L (ref 134–144)
Total Protein: 7.2 g/dL (ref 6.0–8.5)
eGFR: 94 mL/min/{1.73_m2} (ref >59)

## 2021-07-23 LAB — LIPID PANEL
Chol/HDL Ratio: 4.3 ratio (ref 0.0–4.4)
Cholesterol, Total: 181 mg/dL (ref 100–199)
HDL: 42 mg/dL (ref >39)
LDL Chol Calc (NIH): 108 mg/dL — ABNORMAL HIGH (ref 0–99)
Triglycerides: 175 mg/dL — ABNORMAL HIGH (ref 0–149)
VLDL Cholesterol Cal: 31 mg/dL (ref 5–40)

## 2021-07-23 LAB — VITAMIN D 25 HYDROXY (VIT D DEFICIENCY, FRACTURES): Vit D, 25-Hydroxy: 57.1 ng/mL (ref 30.0–100.0)

## 2021-07-23 LAB — INSULIN, RANDOM: INSULIN: 4.7 u[IU]/mL (ref 2.6–24.9)

## 2021-07-23 LAB — HEMOGLOBIN A1C
Est. average glucose Bld gHb Est-mCnc: 117 mg/dL
Hgb A1c MFr Bld: 5.7 % — ABNORMAL HIGH (ref 4.8–5.6)

## 2021-08-01 NOTE — Progress Notes (Signed)
PCP: Jearld Fenton, NP   Chief Complaint  Patient presents with   Gynecologic Exam    No concerns   Injections    Flu shot today     HPI:      Jeanne Velez is a 60 y.o. No obstetric history on file. whose LMP was No LMP recorded. Patient has had a hysterectomy., presents today for her annual examination.  Her menses are absent due to TAH/BSO due to bilat dermoids/ovar cysts. She does have vasomotor sx and is currently on estradiol 0.5 mg 3 times weekly with sx control.   Sex activity: not currently sex active, contraception - status post hysterectomy. She does not have vaginal dryness. Had muscular vag pain with certain movements and sex 7/22; did vag ERT samples for 8 nights and restarted kegels and sx improved, but also no longer sex active, so not as problematic. Had discussed pelvic PT prn.  Last Pap: not recent; s/p hyst. No hx of abn paps. Hx of STDs: HSV  Last mammogram: 08/17/20 Results were: normal--routine follow-up in 12 months. 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 Pt states it's deep and not palpable, but does ache sometimes (denies caffeine use, no change with ERT dose). Hx of other RT breast mass in past with breast MRIs and neg breast bx.   There is a FH of breast cancer in her MGM and pat cousin, as well as melanoma in her mat aunt, genetic testing done. There is no FH of ovarian cancer. Pt is MyRisk neg 9/21; IBIS=15.1%/riskscore=6.7%. The patient does do self-breast exams.  Colonoscopy: 2020 with polyps;  Repeat due after 3 yrs years.  DEXA: a few yrs ago, normal spine and hip.  Tobacco use: The patient denies current or previous tobacco use. Alcohol use: none  No drug use Exercise: moderately active  She does get adequate calcium and Vitamin D in her diet. Hx of Vit D deficiency and on Rx Vit D currently. Doing wt loss through [facility]. Has lost 17# since 5/22.  Has long haul Covid sx when stressed,  last a few days.   Labs with PCP.   Past Medical History:  Diagnosis Date   Acne    Allergy    Anemia    Back pain    BRCA negative 07/2020   MyRisk neg   Cataracts, bilateral    Colon polyp    Constipation    Family history of breast cancer 07/2020   WEXH=37.1%/IRCVELFYB=0.1%   Folliculitis    GERD (gastroesophageal reflux disease)    Hyperlipidemia    Hypothyroidism    Joint pain    Macular degeneration    Osteoarthritis    Retinal vasculitis    Stress    Thyroid cancer ([place])     Past Surgical History:  Procedure Laterality Date   BREAST BIOPSY     FLEXIBLE SIGMOIDOSCOPY N/A 04/28/2021   Procedure: FLEXIBLE SIGMOIDOSCOPY;  Surgeon: Lin Landsman, MD;  Location: [facility] ENDOSCOPY;  Service: Gastroenterology;  Laterality: N/A;   ovarial cystectomy     THYROIDECTOMY     TOTAL ABDOMINAL HYSTERECTOMY W/ BILATERAL SALPINGOOPHORECTOMY     dermoids, ovar cysts   TUBAL LIGATION      Family History  Problem Relation Age of Onset   Myasthenia gravis Mother    Heart disease Father    Depression Father    Skin cancer Father 35   Anxiety disorder Father  Obesity Father    Breast cancer Maternal Grandmother 51   Skin cancer Maternal Grandmother 50   Bone cancer Maternal Aunt 60   Melanoma Maternal Aunt 60   Breast cancer Cousin 77    Social History   Socioeconomic History   Marital status: Married    Spouse name: Not on file   Number of children: Not on file   Years of education: Not on file   Highest education level: Not on file  Occupational History   Occupation: Recreational therapist  Tobacco Use   Smoking status: Never   Smokeless tobacco: Never  Vaping Use   Vaping Use: Never used  Substance and Sexual Activity   Alcohol use: Yes   Drug use: Never   Sexual activity: Yes    Birth control/protection: Surgical    Comment: Hysterectomy  Other Topics Concern   Not on file  Social History Narrative   Not on file   Social Determinants of Health    Financial Resource Strain: Not on file  Food Insecurity: Not on file  Transportation Needs: Not on file  Physical Activity: Not on file  Stress: Not on file  Social Connections: Not on file  Intimate Partner Violence: Not on file     Current Outpatient Medications:    acetaminophen (TYLENOL) 325 MG tablet, Take 650 mg by mouth every 6 (six) hours as needed., Disp: , Rfl:    calcium-vitamin D (OSCAL WITH D) 500-200 MG-UNIT tablet, Take 1 tablet by mouth., Disp: , Rfl:    clindamycin (CLINDAGEL) 1 % gel, Apply 1 application topically daily., Disp: 30 g, Rfl: 5   doxycycline (PERIOSTAT) 20 MG tablet, Take 1 tablet (20 mg total) by mouth daily. For acne. Take with food, Disp: 30 tablet, Rfl: 6   ketoconazole (NIZORAL) 2 % cream, Apply 1 application topically daily as needed for irritation., Disp: 15 g, Rfl: 5   levothyroxine (SYNTHROID) 125 MCG tablet, Take 125 mcg by mouth daily before breakfast., Disp: , Rfl:    loratadine (CLARITIN) 10 MG tablet, Take 10 mg by mouth daily., Disp: , Rfl:    Multiple Vitamins-Minerals (PRESERVISION AREDS 2 PO), Take 1 capsule by mouth 2 (two) times daily. , Disp: , Rfl:    NON FORMULARY, Hypochlorous spray on face and back 2x daily, Disp: , Rfl:    Probiotic Product (PROBIOTIC-10 PO), Take by mouth., Disp: , Rfl:    valACYclovir (VALTREX) 1000 MG tablet, Take 1,000 mg by mouth 2 (two) times daily as needed. , Disp: , Rfl:    Vitamin D, Ergocalciferol, (DRISDOL) 1.25 MG (50000 UNIT) CAPS capsule, Take 1 capsule (50,000 Units total) by mouth every 7 (seven) days., Disp: 4 capsule, Rfl: 0   estradiol (ESTRACE) 0.5 MG tablet, Take 1 tablet (0.5 mg total) by mouth daily., Disp: 90 tablet, Rfl: 1     ROS:  Review of Systems  Constitutional:  Positive for fatigue. Negative for fever and unexpected weight change.  Respiratory:  Negative for cough, shortness of breath and wheezing.   Cardiovascular:  Negative for chest pain, palpitations and leg swelling.   Gastrointestinal:  Negative for blood in stool, constipation, diarrhea, nausea and vomiting.  Endocrine: Negative for cold intolerance, heat intolerance and polyuria.  Genitourinary:  Negative for dyspareunia, dysuria, flank pain, frequency, genital sores, hematuria, menstrual problem, pelvic pain, urgency, vaginal bleeding, vaginal discharge and vaginal pain.  Musculoskeletal:  Negative for back pain, joint swelling and myalgias.  Skin:  Negative for rash.  Neurological:  Positive  for headaches. Negative for dizziness, syncope, light-headedness and numbness.  Hematological:  Negative for adenopathy.  Psychiatric/Behavioral:  Negative for agitation, confusion, sleep disturbance and suicidal ideas. The patient is not nervous/anxious.   BREAST: tenderness   Objective: BP 116/70   Ht '5\' 8"'  (1.727 m)   Wt 207 lb (93.9 kg)   BMI 31.47 kg/m    Physical Exam Constitutional:      Appearance: She is well-developed.  Genitourinary:     Vulva normal.     Genitourinary Comments: UTERUS/CX SURG REM     Right Labia: No rash, tenderness or lesions.    Left Labia: No tenderness, lesions or rash.    Vaginal cuff intact.    No vaginal discharge, erythema or tenderness.      Right Adnexa: absent.    Right Adnexa: not tender and no mass present.    Left Adnexa: absent.    Left Adnexa: not tender and no mass present.    Cervix is absent.     Uterus is absent.  Breasts:    Right: No mass, nipple discharge, skin change or tenderness.     Left: No mass, nipple discharge, skin change or tenderness.  Neck:     Thyroid: No thyromegaly.  Cardiovascular:     Rate and Rhythm: Normal rate and regular rhythm.     Heart sounds: Normal heart sounds. No murmur heard. Pulmonary:     Effort: Pulmonary effort is normal.     Breath sounds: Normal breath sounds.  Abdominal:     Palpations: Abdomen is soft.     Tenderness: There is no abdominal tenderness. There is no guarding.  Musculoskeletal:         General: Normal range of motion.     Cervical back: Normal range of motion.  Neurological:     General: No focal deficit present.     Mental Status: She is alert and oriented to person, place, and time.     Cranial Nerves: No cranial nerve deficit.  Skin:    General: Skin is warm and dry.  Psychiatric:        Mood and Affect: Mood normal.        Behavior: Behavior normal.        Thought Content: Thought content normal.        Judgment: Judgment normal.  Vitals reviewed.    Assessment/Plan:  Encounter for annual routine gynecological examination  Encounter for screening mammogram for malignant neoplasm of breast - Plan: MM 3D SCREEN BREAST BILATERAL; pt to sched mammo  Family history of breast cancer--MyRisk neg; no increased screening recommended.   Hormone replacement therapy (HRT) - Plan: estradiol (ESTRACE) 0.5 MG tablet  Vasomotor symptoms due to menopause - Plan: estradiol (ESTRACE) 0.5 MG tablet; Rx RF. Doing well with 3 x qwk. F/u prn.     Meds ordered this encounter  Medications                         Gae Dry [[id number]]   estradiol (ESTRACE) 0.5 MG tablet    Sig: Take 1 tablet (0.5 mg total) by mouth daily.    Dispense:  90 tablet    Refill:  3    Order Specific Question:   Supervising Provider    Answer:   Gae Dry [[id number]]            GYN counsel breast self exam, mammography screening, use and side effects of HRT, menopause, adequate  intake of calcium and vitamin D, diet and exercise    F/U  Return in about 1 year (around 08/02/2022).  Micaila Ziemba B. Gerrett Loman, PA-C 08/02/2021 8:57 AM

## 2021-08-02 ENCOUNTER — Other Ambulatory Visit: Payer: Self-pay

## 2021-08-02 ENCOUNTER — Ambulatory Visit (INDEPENDENT_AMBULATORY_CARE_PROVIDER_SITE_OTHER): Payer: 59 | Admitting: Obstetrics and Gynecology

## 2021-08-02 ENCOUNTER — Encounter: Payer: Self-pay | Admitting: Obstetrics and Gynecology

## 2021-08-02 VITALS — BP 116/70 | Ht 68.0 in | Wt 207.0 lb

## 2021-08-02 DIAGNOSIS — Z803 Family history of malignant neoplasm of breast: Secondary | ICD-10-CM

## 2021-08-02 DIAGNOSIS — Z1231 Encounter for screening mammogram for malignant neoplasm of breast: Secondary | ICD-10-CM

## 2021-08-02 DIAGNOSIS — Z01419 Encounter for gynecological examination (general) (routine) without abnormal findings: Secondary | ICD-10-CM | POA: Diagnosis not present

## 2021-08-02 DIAGNOSIS — Z23 Encounter for immunization: Secondary | ICD-10-CM | POA: Diagnosis not present

## 2021-08-02 DIAGNOSIS — Z79890 Hormone replacement therapy (postmenopausal): Secondary | ICD-10-CM | POA: Diagnosis not present

## 2021-08-02 DIAGNOSIS — N951 Menopausal and female climacteric states: Secondary | ICD-10-CM

## 2021-08-02 MED ORDER — ESTRADIOL 0.5 MG PO TABS: 0.5000 mg | ORAL_TABLET | Freq: Every day | ORAL | 1 refills | Status: DC

## 2021-08-02 NOTE — Patient Instructions (Signed)
I value your feedback and you entrusting us with your care. If you get a [facility] patient survey, I would appreciate you taking the time to let us know about your experience today. Thank you!  [facility]: [phone]      

## 2021-08-12 ENCOUNTER — Encounter (INDEPENDENT_AMBULATORY_CARE_PROVIDER_SITE_OTHER): Payer: Self-pay | Admitting: Family Medicine

## 2021-08-12 ENCOUNTER — Ambulatory Visit (INDEPENDENT_AMBULATORY_CARE_PROVIDER_SITE_OTHER): Payer: 59 | Admitting: Family Medicine

## 2021-08-12 ENCOUNTER — Other Ambulatory Visit: Payer: Self-pay

## 2021-08-12 VITALS — BP 116/74 | HR 79 | Temp 98.2°F | Ht 68.0 in | Wt 203.0 lb

## 2021-08-12 DIAGNOSIS — R7303 Prediabetes: Secondary | ICD-10-CM

## 2021-08-12 DIAGNOSIS — E669 Obesity, unspecified: Secondary | ICD-10-CM | POA: Diagnosis not present

## 2021-08-12 DIAGNOSIS — Z6833 Body mass index (BMI) 33.0-33.9, adult: Secondary | ICD-10-CM

## 2021-08-12 DIAGNOSIS — E559 Vitamin D deficiency, unspecified: Secondary | ICD-10-CM | POA: Diagnosis not present

## 2021-08-12 DIAGNOSIS — E7849 Other hyperlipidemia: Secondary | ICD-10-CM

## 2021-08-12 DIAGNOSIS — E66811 Obesity, class 1: Secondary | ICD-10-CM

## 2021-08-12 NOTE — Progress Notes (Signed)
Chief Complaint:   OBESITY Jeanne Velez is here to discuss her progress with her obesity treatment plan along with follow-up of her obesity related diagnoses. Jeanne Velez is on keeping a food journal and adhering to recommended goals of 1200-1400 calories and 90 grams of protein daily and states she is following her eating plan approximately 45% of the time. [name] states she is doing home exercises for 40 minutes 5 times per week.  Today's visit was #: 8 Starting weight: 220 lbs Starting date: 03/30/2021 Today's weight: 203 lbs Today's date: 08/12/2021 Total lbs lost to date: 17 Total lbs lost since last in-office visit: 1  Interim History: Jeanne Velez has done well with weight loss, but it is very hectic at work and she is becoming overwhelmed. She has tried to get her protein in and not have tempting sugary foods in her home.  Subjective:   1. Other hyperlipidemia [name] is doing well with diet and exercise. Her triglycerides and LDL have both improved. I discussed labs with the patient today.  2. Pre-diabetes Jeanne Velez's fasting insulin is improving, and her A1c is the same for now. She is doing well with diet and weight loss. I discussed labs with the patient today.  3. Vitamin D deficiency Jeanne Velez's Vit D level is now at goal on OTC calcium plus Vit D. She denies signs of over-replacement. I discussed labs with the patient today.  Assessment/Plan:   1. Other hyperlipidemia Cardiovascular risk and specific lipid/LDL goals reviewed. We discussed several lifestyle modifications today. Loann will continue to work on diet, exercise and weight loss efforts. Orders and follow up as documented in patient record.   2. Pre-diabetes Jeanne Velez will continue to work on diet, exercise, and decreasing simple carbohydrates to help decrease the risk of diabetes.   3. Vitamin D deficiency Low Vitamin D level contributes to fatigue and are associated with obesity, breast, and colon cancer. [name] will continue calcium plus  Vitamin D OTC, and will follow-up for routine testing of Vitamin D, at least 2-3 times per year to avoid over-replacement.  4. Obesity with current BMI 31.0 Jeanne Velez is currently in the action stage of change. As such, her goal is to continue with weight loss efforts. She has agreed to keeping a food journal and adhering to recommended goals of 1200-1300 calories and 90+ grams of protein daily.   Exercise goals: As is.  Behavioral modification strategies: increasing lean protein intake.  Jeanne Velez has agreed to follow-up with our clinic in 3 to 4 weeks. She was informed of the importance of frequent follow-up visits to maximize her success with intensive lifestyle modifications for her multiple health conditions.   Objective:   Blood pressure 116/74, pulse 79, temperature 98.2 F (36.8 C), height 5\' 8"  (1.727 m), weight 203 lb (92.1 kg), SpO2 97 %. Body mass index is 30.87 kg/m.  General: Cooperative, alert, well developed, in no acute distress. HEENT: Conjunctivae and lids unremarkable. Cardiovascular: Regular rhythm.  Lungs: Normal work of breathing. Neurologic: No focal deficits.   Lab Results  Component Value Date   CREATININE 0.74 07/22/2021   BUN 17 07/22/2021   NA 141 07/22/2021   K 4.4 07/22/2021   CL 101 07/22/2021   CO2 25 07/22/2021   Lab Results  Component Value Date   ALT 15 07/22/2021   AST 18 07/22/2021   ALKPHOS 97 07/22/2021   BILITOT 0.8 07/22/2021   Lab Results  Component Value Date   HGBA1C 5.7 (H) 07/22/2021   HGBA1C 5.7 (  H) 03/30/2021   HGBA1C 5.6 08/11/2020   Lab Results  Component Value Date   INSULIN 4.7 07/22/2021   INSULIN 6.7 03/30/2021   Lab Results  Component Value Date   TSH 0.954 03/30/2021   Lab Results  Component Value Date   CHOL 181 07/22/2021   HDL 42 07/22/2021   LDLCALC 108 (H) 07/22/2021   TRIG 175 (H) 07/22/2021   CHOLHDL 4.3 07/22/2021   Lab Results  Component Value Date   VD25OH 57.1 07/22/2021   VD25OH 23.6 (L)  03/30/2021   Lab Results  Component Value Date   WBC 6.9 03/30/2021   HGB 13.8 03/30/2021   HCT 40.9 03/30/2021   MCV 91 03/30/2021   PLT 247 10/10/2020   No results found for: IRON, TIBC, FERRITIN  Attestation Statements:   Reviewed by clinician on day of visit: allergies, medications, problem list, medical history, surgical history, family history, social history, and previous encounter notes.  Time spent on visit including pre-visit chart review and post-visit care and charting was 30 minutes.    I, Trixie Dredge, am acting as transcriptionist for Dennard Nip, MD.  I have reviewed the above documentation for accuracy and completeness, and I agree with the above. -  Dennard Nip, MD

## 2021-08-18 ENCOUNTER — Ambulatory Visit
Admission: RE | Admit: 2021-08-18 | Discharge: 2021-08-18 | Disposition: A | Discharge status: Home / Self Care | Payer: PRIVATE HEALTH INSURANCE | Source: Ambulatory Visit | Attending: Obstetrics and Gynecology | Admitting: Obstetrics and Gynecology

## 2021-08-18 ENCOUNTER — Other Ambulatory Visit: Payer: Self-pay

## 2021-08-18 DIAGNOSIS — Z1231 Encounter for screening mammogram for malignant neoplasm of breast: Secondary | ICD-10-CM | POA: Diagnosis not present

## 2021-08-22 ENCOUNTER — Encounter: Payer: Self-pay | Admitting: Obstetrics and Gynecology

## 2021-08-23 ENCOUNTER — Other Ambulatory Visit: Payer: Self-pay | Admitting: Obstetrics and Gynecology

## 2021-08-23 DIAGNOSIS — N6489 Other specified disorders of breast: Secondary | ICD-10-CM

## 2021-08-23 DIAGNOSIS — R928 Other abnormal and inconclusive findings on diagnostic imaging of breast: Secondary | ICD-10-CM

## 2021-09-06 ENCOUNTER — Ambulatory Visit (INDEPENDENT_AMBULATORY_CARE_PROVIDER_SITE_OTHER): Payer: 59 | Admitting: Adult Health

## 2021-09-06 ENCOUNTER — Other Ambulatory Visit: Payer: Self-pay

## 2021-09-06 ENCOUNTER — Encounter (INDEPENDENT_AMBULATORY_CARE_PROVIDER_SITE_OTHER): Payer: Self-pay | Admitting: Adult Health

## 2021-09-06 VITALS — BP 110/71 | HR 90 | Temp 98.3°F | Ht 68.0 in | Wt 204.0 lb

## 2021-09-06 DIAGNOSIS — Z6833 Body mass index (BMI) 33.0-33.9, adult: Secondary | ICD-10-CM | POA: Diagnosis not present

## 2021-09-06 DIAGNOSIS — E669 Obesity, unspecified: Secondary | ICD-10-CM

## 2021-09-06 DIAGNOSIS — E559 Vitamin D deficiency, unspecified: Secondary | ICD-10-CM

## 2021-09-06 DIAGNOSIS — R7303 Prediabetes: Secondary | ICD-10-CM | POA: Diagnosis not present

## 2021-09-06 DIAGNOSIS — Z9189 Other specified personal risk factors, not elsewhere classified: Secondary | ICD-10-CM | POA: Diagnosis not present

## 2021-09-06 MED ORDER — VITAMIN D (ERGOCALCIFEROL) 1.25 MG (50000 UNIT) PO CAPS: 50000.0000 [IU] | ORAL_CAPSULE | ORAL | 0 refills | Status: DC

## 2021-09-06 NOTE — Progress Notes (Signed)
Chief Complaint:   OBESITY Jeanne Velez is here to discuss her progress with her obesity treatment plan along with follow-up of her obesity related diagnoses. Jeanne Velez is on keeping a food journal and adhering to recommended goals of 1200-1300 calories and 90+ grams of protein and states she is following her eating plan approximately 76% of the time. Jeanne Velez states she is doing cardio/lifting weights for 40-60 minutes 5 times per week.  Today's visit was #: 9 Starting weight: 220 lbs Starting date: 03/30/2021 Today's weight: 204 lbs Today's date: 09/06/2021 Total lbs lost to date: 16 lb Total lbs lost since last in-office visit: 0  Interim History: Jeanne Velez traveled for 2 out of the last 3 weeks - very difficult to consume 90 grams of protein/day when not at home. She has completed all work related travel for the Fall.  Subjective:   1. Vitamin D deficiency Vitamin D level on 07/22/2021 - 57.1 - stable. She is currently taking prescription ergocalciferol 50,000 IU each week. She denies nausea, vomiting or muscle weakness.  2. Pre-diabetes On 07/22/2021, A1c 5.7 with normal BG (91) and insulin level (4.7).  3. At risk for osteoporosis Jeanne Velez is at higher risk of osteopenia and osteoporosis due to Vitamin D deficiency.    Assessment/Plan:   1. Vitamin D deficiency Refill ergocalciferol 50,000 IU once weekly, as per below.  - Refill Vitamin D, Ergocalciferol, (DRISDOL) 1.25 MG (50000 UNIT) CAPS capsule; Take 1 capsule (50,000 Units total) by mouth every 7 (seven) days.  Dispense: 4 capsule; Refill: 0  2. Pre-diabetes Continue journaling plan and regular exercise. When travelling- try to pack nonperishable protein sources.  3. At risk for osteoporosis Jeanne Velez was given approximately 15 minutes of osteoporosis prevention counseling today. Jeanne Velez is at risk for osteopenia and osteoporosis due to her Vitamin D deficiency. She was encouraged to take her Vitamin D and follow her higher calcium diet and  increase strengthening exercise to help strengthen her bones and decrease her risk of osteopenia and osteoporosis.  Repetitive spaced learning was employed today to elicit superior memory formation and behavioral change.   4. Obesity with current BMI 31.1  Jeanne Velez is currently in the action stage of change. As such, her goal is to continue with weight loss efforts. She has agreed to keeping a food journal and adhering to recommended goals of 1200-1300 calories and 90 grams of protein.   Exercise goals:  As is.  Behavioral modification strategies: increasing lean protein intake, decreasing simple carbohydrates, meal planning and cooking strategies, keeping healthy foods in the home, and planning for success.  Jeanne Velez has agreed to follow-up with our clinic in 3-4 weeks. She was informed of the importance of frequent follow-up visits to maximize her success with intensive lifestyle modifications for her multiple health conditions.   Objective:   Blood pressure 110/71, pulse 90, temperature 98.3 F (36.8 C), height 5\' 8"  (1.727 m), weight 204 lb (92.5 kg), SpO2 100 %. Body mass index is 31.02 kg/m.  General: Cooperative, alert, well developed, in no acute distress. HEENT: Conjunctivae and lids unremarkable. Cardiovascular: Regular rhythm.  Lungs: Normal work of breathing. Neurologic: No focal deficits.   Lab Results  Component Value Date   CREATININE 0.74 07/22/2021   BUN 17 07/22/2021   NA 141 07/22/2021   K 4.4 07/22/2021   CL 101 07/22/2021   CO2 25 07/22/2021   Lab Results  Component Value Date   ALT 15 07/22/2021   AST 18 07/22/2021   ALKPHOS 97  07/22/2021   BILITOT 0.8 07/22/2021   Lab Results  Component Value Date   HGBA1C 5.7 (H) 07/22/2021   HGBA1C 5.7 (H) 03/30/2021   HGBA1C 5.6 08/11/2020   Lab Results  Component Value Date   INSULIN 4.7 07/22/2021   INSULIN 6.7 03/30/2021   Lab Results  Component Value Date   TSH 0.954 03/30/2021   Lab Results  Component  Value Date   CHOL 181 07/22/2021   HDL 42 07/22/2021   LDLCALC 108 (H) 07/22/2021   TRIG 175 (H) 07/22/2021   CHOLHDL 4.3 07/22/2021   Lab Results  Component Value Date   VD25OH 57.1 07/22/2021   VD25OH 23.6 (L) 03/30/2021   Lab Results  Component Value Date   WBC 6.9 03/30/2021   HGB 13.8 03/30/2021   HCT 40.9 03/30/2021   MCV 91 03/30/2021   PLT 247 10/10/2020   Attestation Statements:   Reviewed by clinician on day of visit: allergies, medications, problem list, medical history, surgical history, family history, social history, and previous encounter notes.  I, Water quality scientist, CMA, am acting as Location manager for Mina Marble, NP.  I have reviewed the above documentation for accuracy and completeness, and I agree with the above. -  Chanel Mckesson d. Percilla Tweten, NP-C

## 2021-09-07 ENCOUNTER — Ambulatory Visit
Admission: RE | Admit: 2021-09-07 | Discharge: 2021-09-07 | Disposition: A | Discharge status: Home / Self Care | Payer: PRIVATE HEALTH INSURANCE | Source: Ambulatory Visit | Attending: Obstetrics and Gynecology | Admitting: Obstetrics and Gynecology

## 2021-09-07 DIAGNOSIS — R928 Other abnormal and inconclusive findings on diagnostic imaging of breast: Secondary | ICD-10-CM | POA: Diagnosis not present

## 2021-09-07 DIAGNOSIS — N6489 Other specified disorders of breast: Secondary | ICD-10-CM | POA: Diagnosis present

## 2021-09-13 ENCOUNTER — Telehealth: Payer: Self-pay

## 2021-09-13 NOTE — Telephone Encounter (Signed)
Copied from [place] [id number]. Topic: General - Other >> Sep 13, 2021  3:47 PM Yvette Rack wrote: Reason for CRM: Pt stated she had labs drawn on 07/20/21 and she does not know if her insurance will cover the cost to have more labs so close together. Pt would like to know if recent labs can be used. Pt requests to be contacted with an update either by phone or through [facility]

## 2021-09-14 NOTE — Telephone Encounter (Signed)
Reviewed labs, we will not need additional labs at her upcoming appt

## 2021-09-14 NOTE — Telephone Encounter (Signed)
Used for what?

## 2021-09-15 NOTE — Telephone Encounter (Signed)
The pt was notified of the providers recommendation. She verbalize understanding, no questions or concerns.

## 2021-09-16 ENCOUNTER — Encounter: Payer: Self-pay | Admitting: Internal Medicine

## 2021-09-16 ENCOUNTER — Other Ambulatory Visit: Payer: Self-pay

## 2021-09-16 ENCOUNTER — Ambulatory Visit (INDEPENDENT_AMBULATORY_CARE_PROVIDER_SITE_OTHER): Payer: 59 | Admitting: Internal Medicine

## 2021-09-16 VITALS — BP 115/62 | HR 94 | Temp 97.8°F | Resp 18 | Ht 68.0 in | Wt 211.4 lb

## 2021-09-16 DIAGNOSIS — E890 Postprocedural hypothyroidism: Secondary | ICD-10-CM | POA: Diagnosis not present

## 2021-09-16 DIAGNOSIS — M1990 Unspecified osteoarthritis, unspecified site: Secondary | ICD-10-CM | POA: Diagnosis not present

## 2021-09-16 DIAGNOSIS — E782 Mixed hyperlipidemia: Secondary | ICD-10-CM

## 2021-09-16 DIAGNOSIS — R7303 Prediabetes: Secondary | ICD-10-CM

## 2021-09-16 DIAGNOSIS — Z0001 Encounter for general adult medical examination with abnormal findings: Secondary | ICD-10-CM | POA: Diagnosis not present

## 2021-09-16 DIAGNOSIS — E6609 Other obesity due to excess calories: Secondary | ICD-10-CM | POA: Diagnosis not present

## 2021-09-16 DIAGNOSIS — Z23 Encounter for immunization: Secondary | ICD-10-CM

## 2021-09-16 DIAGNOSIS — N951 Menopausal and female climacteric states: Secondary | ICD-10-CM | POA: Diagnosis not present

## 2021-09-16 DIAGNOSIS — E66811 Obesity, class 1: Secondary | ICD-10-CM

## 2021-09-16 DIAGNOSIS — Z6832 Body mass index (BMI) 32.0-32.9, adult: Secondary | ICD-10-CM | POA: Diagnosis not present

## 2021-09-16 NOTE — Assessment & Plan Note (Signed)
Encourage low-carb diet and exercise for weight loss 

## 2021-09-16 NOTE — Patient Instructions (Signed)

## 2021-09-16 NOTE — Assessment & Plan Note (Signed)
Encourage regular physical activity Encouraged weight loss as this can help reduce joint pain, she is actively working on this Continue Tylenol OTC as needed

## 2021-09-16 NOTE — Assessment & Plan Note (Signed)
Encouraged her to consume a low-fat diet Not medicated 

## 2021-09-16 NOTE — Assessment & Plan Note (Signed)
Continue Estrace

## 2021-09-16 NOTE — Progress Notes (Signed)
Subjective:    Patient ID: Jeanne Velez, female    DOB: February 15, 1961, 60 y.o.   MRN: [id number]  HPI  Patient presents the clinic today for her annual exam.  She is also due to follow-up chronic conditions.  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  She follows with endocrinology.  OA: Mainly in her hands, wrists, shoulders, hips and knees.  She takes Tylenol as needed with good relief of symptoms.  HLD: Her last LDL was 108, triglycerides.  175, 07/2021.  She is not taking any cholesterol-lowering medication at this time.  She tries to consume a low-fat diet.  Menopausal Symptoms: Post hysterectomy. Mainly hot flashes. She is taking Estrace 3 x week.  Prediabetes: Her last A1c was 5.7%, 08/2021.  She is not taking any oral diabetic medication at this time.  She does not check her sugars.  Flu: 07/2021 Tetanus: unsure Shingrix: never Covid: Pfizer x 4 Pap Smear: Total hysterectomy Mammogram: 08/2021 Bone Density: > 5 years ago Colon Screening: 09/2019 Vision Screening: biannually Dentist: biannually  Diet: She does eat meat. She consumes fruits and veggies. She tries to avoid fried foods. She drinks mostly water, unsweet tea. Exercise: 30 minutes, walking 5 days per week  Review of Systems     Past Medical History:  Diagnosis Date   Acne    Allergy    Anemia    Back pain    BRCA negative 07/2020   MyRisk neg   Cataracts, bilateral    Colon polyp    Constipation    Family history of breast cancer 07/2020   APOL=41.0%/VUDTHYHOO=8.7%   Folliculitis    GERD (gastroesophageal reflux disease)    Hyperlipidemia    Hypothyroidism    Joint pain    Macular degeneration    Osteoarthritis    Retinal vasculitis    Stress    Thyroid cancer ([place])     Current Outpatient Medications  Medication Sig Dispense Refill   acetaminophen (TYLENOL) 325 MG tablet Take 650 mg by mouth every 6 (six) hours as  needed.     calcium-vitamin D (OSCAL WITH D) 500-200 MG-UNIT tablet Take 1 tablet by mouth.     clindamycin (CLINDAGEL) 1 % gel Apply 1 application topically daily. 30 g 5   doxycycline (PERIOSTAT) 20 MG tablet Take 1 tablet (20 mg total) by mouth daily. For acne. Take with food 30 tablet 6   estradiol (ESTRACE) 0.5 MG tablet Take 1 tablet (0.5 mg total) by mouth daily. 90 tablet 1   ketoconazole (NIZORAL) 2 % cream Apply 1 application topically daily as needed for irritation. 15 g 5   levothyroxine (SYNTHROID) 125 MCG tablet Take 125 mcg by mouth daily before breakfast.     loratadine (CLARITIN) 10 MG tablet Take 10 mg by mouth daily.     Multiple Vitamins-Minerals (PRESERVISION AREDS 2 PO) Take 1 capsule by mouth 2 (two) times daily.      NON FORMULARY Hypochlorous spray on face and back 2x daily     Probiotic Product (PROBIOTIC-10 PO) Take by mouth.     valACYclovir (VALTREX) 1000 MG tablet Take 1,000 mg by mouth 2 (two) times daily as needed.      Vitamin D, Ergocalciferol, (DRISDOL) 1.25 MG (50000 UNIT) CAPS capsule Take 1 capsule (50,000 Units total) by mouth every 7 (seven) days. 4 capsule 0   tretinoin (RETIN-A) 0.025 % cream Apply topically at bedtime.     No  current facility-administered medications for this visit.    Allergies  Allergen Reactions   Ginger Swelling    Tongue/Face   Penicillin G Swelling    Tongue/Face    Penicillins Swelling    Tongue/Face/Throat   Adhesive [Tape] Itching   Cat Hair Extract Itching   Latex Itching   Wound Dressing Adhesive Itching and Swelling    Family History  Problem Relation Age of Onset   Myasthenia gravis Mother    Heart disease Father    Depression Father    Skin cancer Father 66   Anxiety disorder Father    Obesity Father    Breast cancer Maternal Grandmother 59   Skin cancer Maternal Grandmother 50   Bone cancer Maternal Aunt 60   Melanoma Maternal Aunt 60   Breast cancer Cousin 38    Social History   Socioeconomic  History   Marital status: Married    Spouse name: Not on file   Number of children: Not on file   Years of education: Not on file   Highest education level: Not on file  Occupational History   Occupation: Recreational therapist  Tobacco Use   Smoking status: Never   Smokeless tobacco: Never  Vaping Use   Vaping Use: Never used  Substance and Sexual Activity   Alcohol use: Yes   Drug use: Never   Sexual activity: Yes    Birth control/protection: Surgical    Comment: Hysterectomy  Other Topics Concern   Not on file  Social History Narrative   Not on file   Social Determinants of Health   Financial Resource Strain: Not on file  Food Insecurity: Not on file  Transportation Needs: Not on file  Physical Activity: Not on file  Stress: Not on file  Social Connections: Not on file  Intimate Partner Violence: Not on file     Constitutional: Denies fever, malaise, fatigue, headache or abrupt weight changes.  HEENT: Denies eye pain, eye redness, ear pain, ringing in the ears, wax buildup, runny nose, nasal congestion, bloody nose, or sore throat. Respiratory: Denies difficulty breathing, shortness of breath, cough or sputum production.   Cardiovascular: Denies chest pain, chest tightness, palpitations or swelling in the hands or feet.  Gastrointestinal: Denies abdominal pain, bloating, constipation, diarrhea or blood in the stool.  GU: Denies urgency, frequency, pain with urination, burning sensation, blood in urine, odor or discharge. Musculoskeletal: Patient reports intermittent joint pain.  Denies decrease in range of motion, difficulty with gait, muscle pain or joint swelling.  Skin: Denies redness, rashes, lesions or ulcercations.  Neurological: Patient reports admit hot flashes.  Denies dizziness, difficulty with memory, difficulty with speech or problems with balance and coordination.  Psych: Denies anxiety, depression, SI/HI.  No other specific complaints in a complete  review of systems (except as listed in HPI above).  Objective:   Physical Exam   BP 115/62 (BP Location: Right Arm, Patient Position: Sitting, Cuff Size: Large)   Pulse 94   Temp 97.8 F (36.6 C) (Temporal)   Resp 18   Ht '5\' 8"'  (1.727 m)   Wt 211 lb 6.4 oz (95.9 kg)   SpO2 100%   BMI 32.14 kg/m  Wt Readings from Last 3 Encounters:  09/16/21 211 lb 6.4 oz (95.9 kg)  09/06/21 204 lb (92.5 kg)  08/12/21 203 lb (92.1 kg)    General: Appears her stated age, obese, in NAD. Skin: Warm, dry and intact.  HEENT: Head: normal shape and size; Eyes:  EOMs intact;  Neck:  Neck supple, trachea midline. No masses, lumps present.  Cardiovascular: Normal rate and rhythm. S1,S2 noted.  No murmur, rubs or gallops noted. No JVD or BLE edema. No carotid bruits noted. Pulmonary/Chest: Normal effort and positive vesicular breath sounds. No respiratory distress. No wheezes, rales or ronchi noted.  Abdomen: Soft and nontender. Normal bowel sounds. No distention or masses noted. Liver, spleen and kidneys non palpable. Musculoskeletal: Strength 5/5 BUE/BLE. No difficulty with gait.  Neurological: Alert and oriented. Cranial nerves II-XII grossly intact. Coordination normal.  Psychiatric: Mood and affect normal. Behavior is normal. Judgment and thought content normal.    BMET    Component Value Date/Time   NA 141 07/22/2021 0953   K 4.4 07/22/2021 0953   CL 101 07/22/2021 0953   CO2 25 07/22/2021 0953   GLUCOSE 91 07/22/2021 0953   GLUCOSE 182 (H) 10/10/2020 1331   BUN 17 07/22/2021 0953   CREATININE 0.74 07/22/2021 0953   CALCIUM 9.2 07/22/2021 0953   GFRNONAA >60 10/10/2020 1331   GFRAA 95 08/11/2020 1203    Lipid Panel     Component Value Date/Time   CHOL 181 07/22/2021 0953   TRIG 175 (H) 07/22/2021 0953   HDL 42 07/22/2021 0953   CHOLHDL 4.3 07/22/2021 0953   LDLCALC 108 (H) 07/22/2021 0953    CBC    Component Value Date/Time   WBC 6.9 03/30/2021 1004   WBC 10.8 (H) 10/10/2020  1331   RBC 4.49 03/30/2021 1004   RBC 4.41 10/10/2020 1331   HGB 13.8 03/30/2021 1004   HCT 40.9 03/30/2021 1004   PLT 247 10/10/2020 1331   PLT 284 08/11/2020 1203   MCV 91 03/30/2021 1004   MCH 30.7 03/30/2021 1004   MCH 30.8 10/10/2020 1331   MCHC 33.7 03/30/2021 1004   MCHC 33.6 10/10/2020 1331   RDW 11.8 03/30/2021 1004   LYMPHSABS 1.9 03/30/2021 1004   EOSABS 0.1 03/30/2021 1004   BASOSABS 0.1 03/30/2021 1004    Hgb A1C Lab Results  Component Value Date   HGBA1C 5.7 (H) 07/22/2021            Assessment & Plan:   Preventative Health Maintenance:  Flu shot UTD Tdap today Discussed Shingrix vaccine, she will check coverage with her insurance company Encouraged her to get her COVID booster She no longer needs Pap smears Mammogram UTD Advised her to discuss bone density with her GYN at next visit Colon screening UTD Encouraged her to consume a balanced diet and exercise regimen Advised her to see an eye doctor and dentist annually Recent labs reviewed  RTC in 1 year, sooner if needed  Webb Silversmith, NP This visit occurred during the SARS-CoV-2 public health emergency.  Safety protocols were in place, including screening questions prior to the visit, additional usage of staff PPE, and extensive cleaning of exam room while observing appropriate contact time as indicated for disinfecting solutions.

## 2021-09-16 NOTE — Assessment & Plan Note (Signed)
She is actively working on weight loss at this time

## 2021-09-16 NOTE — Assessment & Plan Note (Signed)
Thyroid studies reviewed Continue Levothyroxine She will continue to see endocrinology, will follow

## 2021-09-23 ENCOUNTER — Encounter (INDEPENDENT_AMBULATORY_CARE_PROVIDER_SITE_OTHER): Payer: Self-pay

## 2021-09-26 ENCOUNTER — Other Ambulatory Visit (INDEPENDENT_AMBULATORY_CARE_PROVIDER_SITE_OTHER): Payer: Self-pay | Admitting: Adult Health

## 2021-09-26 DIAGNOSIS — E559 Vitamin D deficiency, unspecified: Secondary | ICD-10-CM

## 2021-09-27 ENCOUNTER — Ambulatory Visit (INDEPENDENT_AMBULATORY_CARE_PROVIDER_SITE_OTHER): Payer: 59 | Admitting: Family Medicine

## 2021-09-27 NOTE — Telephone Encounter (Signed)
Pt last seen by Katy Danford, FNP.  

## 2021-09-27 NOTE — Telephone Encounter (Signed)
LAST APPOINTMENT DATE: 09-06-21 NEXT APPOINTMENT DATE: 10-18-21   [facility] - [place], [place] [facility] [address] [place] [address] Phone: [phone] Fax: [phone]  [id number] [place], [place] - [place] [address] Phone: [phone] Fax: [phone]  [facility] ([facility] - [place], [address] [place] [address] Phone: [phone] Fax: [phone]  Patient is requesting a refill of the following medications: Pending Prescriptions:                       Disp   Refills   Vitamin D, Ergocalciferol, (DRISDOL) 1.25 *4 caps*11      Sig: TAKE 1 CAPSULE BY MOUTH  EVERY 7 DAYS   Date last filled: 09-06-21 Previously prescribed by [facility]  Lab Results      Component                Value               Date                      HGBA1C                   5.7 (H)             07/22/2021                HGBA1C                   5.7 (H)             03/30/2021                HGBA1C                   5.6                 08/11/2020           Lab Results      Component                Value               Date                      LDLCALC                  108 (H)             07/22/2021                CREATININE               0.74                07/22/2021           Lab Results      Component                Value               Date                      VD25OH                   57.1  07/22/2021                VD25OH                   23.6 (L)            03/30/2021            BP Readings from Last 3 Encounters: 09/16/21 : 115/62 09/06/21 : 110/71 08/12/21 : 116/74

## 2021-10-16 ENCOUNTER — Other Ambulatory Visit: Payer: Self-pay | Admitting: Dermatology

## 2021-10-16 DIAGNOSIS — L700 Acne vulgaris: Secondary | ICD-10-CM

## 2021-10-18 ENCOUNTER — Encounter (INDEPENDENT_AMBULATORY_CARE_PROVIDER_SITE_OTHER): Payer: Self-pay | Admitting: Adult Health

## 2021-10-18 ENCOUNTER — Other Ambulatory Visit: Payer: Self-pay

## 2021-10-18 ENCOUNTER — Ambulatory Visit (INDEPENDENT_AMBULATORY_CARE_PROVIDER_SITE_OTHER): Payer: 59 | Admitting: Adult Health

## 2021-10-18 ENCOUNTER — Telehealth: Payer: Self-pay

## 2021-10-18 VITALS — BP 127/82 | HR 84 | Temp 98.1°F | Ht 68.0 in | Wt 206.0 lb

## 2021-10-18 DIAGNOSIS — E559 Vitamin D deficiency, unspecified: Secondary | ICD-10-CM | POA: Diagnosis not present

## 2021-10-18 DIAGNOSIS — Z6833 Body mass index (BMI) 33.0-33.9, adult: Secondary | ICD-10-CM

## 2021-10-18 DIAGNOSIS — Z9189 Other specified personal risk factors, not elsewhere classified: Secondary | ICD-10-CM | POA: Diagnosis not present

## 2021-10-18 DIAGNOSIS — E669 Obesity, unspecified: Secondary | ICD-10-CM | POA: Diagnosis not present

## 2021-10-18 DIAGNOSIS — R7303 Prediabetes: Secondary | ICD-10-CM

## 2021-10-18 MED ORDER — VITAMIN D (ERGOCALCIFEROL) 1.25 MG (50000 UNIT) PO CAPS: 50000.0000 [IU] | ORAL_CAPSULE | ORAL | 0 refills | Status: DC

## 2021-10-18 NOTE — Progress Notes (Signed)
Chief Complaint:   OBESITY Jeanne Velez is here to discuss her progress with her obesity treatment plan along with follow-up of her obesity related diagnoses. Jeanne Velez is on keeping a food journal and adhering to recommended goals of 1200-1300 calories and 90 grams of protein and states she is following her eating plan approximately 0% of the time. Jeanne Velez states she is walking (brisk) for 30 minutes 3-4 times per week.  Today's visit was #: 10 Starting weight: 220 lbs Starting date: 03/30/2021 Today's weight: 206 lbs Today's date: 10/18/2021 Total lbs lost to date: 14 lbs Total lbs lost since last in-office visit: 0  Interim History:  Jeanne Velez tracked 17 days since her last office visit on 09/06/2021. 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 Closing current office and moving to a new location.  Reviewed bioimpedance results with the patient.  Subjective:   1. Vitamin D deficiency Vitamin D level on 07/22/2021 - 57.1 - stable. She is currently taking prescription ergocalciferol 50,000 IU each week. She denies nausea, vomiting or muscle weakness.  2. Pre-diabetes Last 2 A1c's - 5.7. She is not on blood glucose lowering medication.  3. At risk for diabetes mellitus Jeanne Velez is at higher than average risk for developing diabetes due to obesity.    Assessment/Plan:   1. Vitamin D deficiency Refill ergocalciferol 50,000 IU once weekly. Check labs at next office visit.  - Refill Vitamin D, Ergocalciferol, (DRISDOL) 1.25 MG (50000 UNIT) CAPS capsule; Take 1 capsule (50,000 Units total) by mouth every 7 (seven) days.  Dispense: 8 capsule; Refill: 0  2. Pre-diabetes Check labs at next office visit.  3. At risk for diabetes mellitus Jeanne Velez was given approximately 15 minutes of diabetes education and counseling today. We discussed intensive lifestyle modifications today with an emphasis on weight loss as well as increasing exercise and  decreasing simple carbohydrates in her diet. We also reviewed medication options with an emphasis on risk versus benefit of those discussed.   Repetitive spaced learning was employed today to elicit superior memory formation and behavioral change.  4. Obesity with current BMI 31.4  Jeanne Velez is currently in the action stage of change. As such, her goal is to continue with weight loss efforts. She has agreed to keeping a food journal and adhering to recommended goals of 1200-1300 calories and 90 grams of protein.   Exercise goals:  As is.  Behavioral modification strategies: increasing lean protein intake, decreasing simple carbohydrates, meal planning and cooking strategies, keeping healthy foods in the home, and planning for success.  Jeanne Velez has agreed to follow-up with our clinic in 3 weeks, fasting. She was informed of the importance of frequent follow-up visits to maximize her success with intensive lifestyle modifications for her multiple health conditions.   Objective:   Blood pressure 127/82, pulse 84, temperature 98.1 F (36.7 C), height 5\' 8"  (1.727 m), weight 206 lb (93.4 kg), SpO2 100 %. Body mass index is 31.32 kg/m.  General: Cooperative, alert, well developed, in no acute distress. HEENT: Conjunctivae and lids unremarkable. Cardiovascular: Regular rhythm.  Lungs: Normal work of breathing. Neurologic: No focal deficits.   Lab Results  Component Value Date   CREATININE 0.74 07/22/2021   BUN 17 07/22/2021   NA 141 07/22/2021   K 4.4 07/22/2021   CL 101 07/22/2021   CO2 25 07/22/2021   Lab Results  Component Value Date   ALT 15 07/22/2021   AST 18 07/22/2021   ALKPHOS  97 07/22/2021   BILITOT 0.8 07/22/2021   Lab Results  Component Value Date   HGBA1C 5.7 (H) 07/22/2021   HGBA1C 5.7 (H) 03/30/2021   HGBA1C 5.6 08/11/2020   Lab Results  Component Value Date   INSULIN 4.7 07/22/2021   INSULIN 6.7 03/30/2021   Lab Results  Component Value Date   TSH 0.954  03/30/2021   Lab Results  Component Value Date   CHOL 181 07/22/2021   HDL 42 07/22/2021   LDLCALC 108 (H) 07/22/2021   TRIG 175 (H) 07/22/2021   CHOLHDL 4.3 07/22/2021   Lab Results  Component Value Date   VD25OH 57.1 07/22/2021   VD25OH 23.6 (L) 03/30/2021   Lab Results  Component Value Date   WBC 6.9 03/30/2021   HGB 13.8 03/30/2021   HCT 40.9 03/30/2021   MCV 91 03/30/2021   PLT 247 10/10/2020   Attestation Statements:   Reviewed by clinician on day of visit: allergies, medications, problem list, medical history, surgical history, family history, social history, and previous encounter notes.  I, Water quality scientist, CMA, am acting as Location manager for Mina Marble, NP.  I have reviewed the above documentation for accuracy and completeness, and I agree with the above. -  Gianah Batt d. Rodell Marrs, NP-C

## 2021-10-18 NOTE — Telephone Encounter (Signed)
Medical records from [facility] in patients EMR under media.

## 2021-11-04 ENCOUNTER — Telehealth (INDEPENDENT_AMBULATORY_CARE_PROVIDER_SITE_OTHER): Payer: Self-pay

## 2021-11-08 ENCOUNTER — Other Ambulatory Visit (INDEPENDENT_AMBULATORY_CARE_PROVIDER_SITE_OTHER): Payer: Self-pay | Admitting: Adult Health

## 2021-11-08 DIAGNOSIS — E559 Vitamin D deficiency, unspecified: Secondary | ICD-10-CM

## 2021-11-09 ENCOUNTER — Telehealth (INDEPENDENT_AMBULATORY_CARE_PROVIDER_SITE_OTHER): Payer: 59 | Admitting: Adult Health

## 2021-11-09 ENCOUNTER — Other Ambulatory Visit: Payer: Self-pay

## 2021-11-09 ENCOUNTER — Encounter (INDEPENDENT_AMBULATORY_CARE_PROVIDER_SITE_OTHER): Payer: Self-pay | Admitting: Adult Health

## 2021-11-09 DIAGNOSIS — E559 Vitamin D deficiency, unspecified: Secondary | ICD-10-CM | POA: Diagnosis not present

## 2021-11-09 DIAGNOSIS — E669 Obesity, unspecified: Secondary | ICD-10-CM

## 2021-11-09 DIAGNOSIS — Z6833 Body mass index (BMI) 33.0-33.9, adult: Secondary | ICD-10-CM | POA: Diagnosis not present

## 2021-11-09 DIAGNOSIS — Z20822 Contact with and (suspected) exposure to covid-19: Secondary | ICD-10-CM

## 2021-11-09 NOTE — Progress Notes (Signed)
TeleHealth Visit:  Due to the COVID-19 pandemic, this visit was completed with telemedicine (audio/video) technology to reduce patient and provider exposure as well as to preserve personal protective equipment.   Jeanne Velez has verbally consented to this TeleHealth visit. The patient is located at home, the provider is located at the [facility] office. The participants in this visit include the listed provider and patient. The visit was conducted today via MyChart video.   Chief Complaint: OBESITY Jeanne Velez is here to discuss her progress with her obesity treatment plan along with follow-up of her obesity related diagnoses. Jeanne Velez is on keeping a food journal and adhering to recommended goals of 1200-1300 calories and 90 grams of protein daily and states she is following her eating plan approximately 70% of the time. Jeanne Velez states she is doing 0 minutes 0 times per week.  Today's visit was #: 11 Starting weight: 220 lbs Starting date: 03/30/2021  Interim History:  Jeanne Velez attended a concert last weekend, and 3 days later her husband reported fever, nasal congestion, body aches, fatigue, and pharyngitis. He had a positive home antigen COVID-19 test on 11/02/2021. She estimates that he is 75% improved since his sx onset. She tested on 11/03/2021 and 11/04/2021 with Home Antigen COVID-19 test-both were negative. She reports only symptom-headache.  She ans her husband are both fully vaccinated with recent bivalent booster in 09/2021. This is the 3rd COVID-19 infection for her husband. She has never tested positive for the virus.  Subjective:   1. Close exposure to COVID-19 virus Jeanne Velez attended a concert last weekend, and 3 days later her husband reported fever, nasal congestion, body aches, fatigue, and pharyngitis. He had a positive home antigen COVID-19 test on 11/02/2021. She estimates that he is 75% improved since his sx onset. She tested on 11/03/2021 and 11/04/2021 with Home Antigen  COVID-19 test-both were negative. She reports only symptom-headache.  She ans her husband are both fully vaccinated with recent bivalent booster in 09/2021. This is the 3rd COVID-19 infection for her husband. She has never tested positive for the virus.  2. Vitamin D deficiency Jeanne Velez's Vitamin D level was 57.1, stable on 07/22/2021. She is on Ergocalciferol, and she denies nausea, vomiting, or muscle weakness.  Assessment/Plan:   1. Close exposure to COVID-19 virus Jeanne Velez will continue to follow CDC guidelines for COVID exposure. Orders and follow up as documented in patient record.  Counseling COVID-19 is a respiratory infection that is caused by a virus. It can cause serious infections, such as pneumonia, acute respiratory distress syndrome, acute respiratory failure, or sepsis. You are more likely to develop a serious illness if you are 58 years of age or older, have a weak immune system, live in a nursing home, have chronic disease, or have obesity. Get vaccinated as soon as they are available to you.  For our most current information, please visit [email or web address]. Wash your hands often with soap and water for 20 seconds. If soap and water are not available, use alcohol-based hand sanitizer. Wear a face mask. Make sure your mask covers your nose and mouth. Maintain at least 6 feet distance from others when in public.  Get help right away if You have trouble breathing, chest pain, confusion, or other concerning symptoms.  2. Vitamin D deficiency Low Vitamin D level contributes to fatigue and are associated with obesity, breast, and colon cancer. Jeanne Velez will continue prescription Vitamin D 50,000 IU every week, no refill needed. She will follow-up for routine testing of  Vitamin D, at least 2-3 times per year to avoid over-replacement.  3. Obesity with current BMI 31.4 Jeanne Velez is currently in the action stage of change. As such, her goal is to continue with weight loss efforts. She  has agreed to keeping a food journal and adhering to recommended goals of 1200-1300 calories and 90 grams of protein daily.   Exercise goals: No exercise has been prescribed at this time.  Behavioral modification strategies: increasing lean protein intake, decreasing simple carbohydrates, meal planning and cooking strategies, keeping healthy foods in the home, planning for success, and keeping a strict food journal.  Jeanne Velez has agreed to follow-up with our clinic in 3 weeks. She was informed of the importance of frequent follow-up visits to maximize her success with intensive lifestyle modifications for her multiple health conditions.  Objective:   VITALS: Per patient if applicable, see vitals. GENERAL: Alert and in no acute distress. CARDIOPULMONARY: No increased WOB. Speaking in clear sentences.  PSYCH: Pleasant and cooperative. Speech normal rate and rhythm. Affect is appropriate. Insight and judgement are appropriate. Attention is focused, linear, and appropriate.  NEURO: Oriented as arrived to appointment on time with no prompting.   Lab Results  Component Value Date   CREATININE 0.74 07/22/2021   BUN 17 07/22/2021   NA 141 07/22/2021   K 4.4 07/22/2021   CL 101 07/22/2021   CO2 25 07/22/2021   Lab Results  Component Value Date   ALT 15 07/22/2021   AST 18 07/22/2021   ALKPHOS 97 07/22/2021   BILITOT 0.8 07/22/2021   Lab Results  Component Value Date   HGBA1C 5.7 (H) 07/22/2021   HGBA1C 5.7 (H) 03/30/2021   HGBA1C 5.6 08/11/2020   Lab Results  Component Value Date   INSULIN 4.7 07/22/2021   INSULIN 6.7 03/30/2021   Lab Results  Component Value Date   TSH 0.954 03/30/2021   Lab Results  Component Value Date   CHOL 181 07/22/2021   HDL 42 07/22/2021   LDLCALC 108 (H) 07/22/2021   TRIG 175 (H) 07/22/2021   CHOLHDL 4.3 07/22/2021   Lab Results  Component Value Date   VD25OH 57.1 07/22/2021   VD25OH 23.6 (L) 03/30/2021   Lab Results  Component Value Date    WBC 6.9 03/30/2021   HGB 13.8 03/30/2021   HCT 40.9 03/30/2021   MCV 91 03/30/2021   PLT 247 10/10/2020   No results found for: IRON, TIBC, FERRITIN  Attestation Statements:   Reviewed by clinician on day of visit: allergies, medications, problem list, medical history, surgical history, family history, social history, and previous encounter notes.   Wilhemena Durie, am acting as transcriptionist for Mina Marble, NP.  I have reviewed the above documentation for accuracy and completeness, and I agree with the above. - Laithan Conchas d. Evelynne Spiers, NP-C

## 2021-12-06 ENCOUNTER — Other Ambulatory Visit: Payer: Self-pay

## 2021-12-06 ENCOUNTER — Other Ambulatory Visit (INDEPENDENT_AMBULATORY_CARE_PROVIDER_SITE_OTHER): Payer: Self-pay | Admitting: Adult Health

## 2021-12-06 DIAGNOSIS — E559 Vitamin D deficiency, unspecified: Secondary | ICD-10-CM

## 2021-12-06 DIAGNOSIS — L219 Seborrheic dermatitis, unspecified: Secondary | ICD-10-CM

## 2021-12-06 MED ORDER — DOXYCYCLINE HYCLATE 20 MG PO TABS: 20.0000 mg | ORAL_TABLET | Freq: Two times a day (BID) | ORAL | 0 refills | Status: DC

## 2021-12-06 NOTE — Progress Notes (Signed)
Patient called, out of Doxycycline RFs and currently has a flare on face.  1 RF sent in take 1 PO BID and patient advised to decrease to 1 po QD once flare calms.  Patient seeing Dr. Laurence Ferrari for 1 year follow up next month. aw

## 2021-12-13 ENCOUNTER — Ambulatory Visit (INDEPENDENT_AMBULATORY_CARE_PROVIDER_SITE_OTHER): Payer: 59 | Admitting: Adult Health

## 2021-12-13 ENCOUNTER — Encounter (INDEPENDENT_AMBULATORY_CARE_PROVIDER_SITE_OTHER): Payer: Self-pay | Admitting: Adult Health

## 2021-12-13 ENCOUNTER — Other Ambulatory Visit: Payer: Self-pay

## 2021-12-13 VITALS — BP 111/70 | HR 82 | Temp 98.0°F | Ht 68.0 in | Wt 203.0 lb

## 2021-12-13 DIAGNOSIS — Z6830 Body mass index (BMI) 30.0-30.9, adult: Secondary | ICD-10-CM

## 2021-12-13 DIAGNOSIS — F439 Reaction to severe stress, unspecified: Secondary | ICD-10-CM | POA: Diagnosis not present

## 2021-12-13 DIAGNOSIS — E669 Obesity, unspecified: Secondary | ICD-10-CM

## 2021-12-13 DIAGNOSIS — R7303 Prediabetes: Secondary | ICD-10-CM | POA: Diagnosis not present

## 2021-12-13 DIAGNOSIS — E559 Vitamin D deficiency, unspecified: Secondary | ICD-10-CM

## 2021-12-13 DIAGNOSIS — Z9189 Other specified personal risk factors, not elsewhere classified: Secondary | ICD-10-CM

## 2021-12-14 ENCOUNTER — Other Ambulatory Visit (INDEPENDENT_AMBULATORY_CARE_PROVIDER_SITE_OTHER): Payer: Self-pay | Admitting: Adult Health

## 2021-12-14 ENCOUNTER — Encounter (INDEPENDENT_AMBULATORY_CARE_PROVIDER_SITE_OTHER): Payer: Self-pay | Admitting: Adult Health

## 2021-12-14 DIAGNOSIS — F439 Reaction to severe stress, unspecified: Secondary | ICD-10-CM | POA: Insufficient documentation

## 2021-12-14 DIAGNOSIS — E559 Vitamin D deficiency, unspecified: Secondary | ICD-10-CM

## 2021-12-14 LAB — HEMOGLOBIN A1C
Est. average glucose Bld gHb Est-mCnc: 114 mg/dL
Hgb A1c MFr Bld: 5.6 % (ref 4.8–5.6)

## 2021-12-14 LAB — VITAMIN D 25 HYDROXY (VIT D DEFICIENCY, FRACTURES): Vit D, 25-Hydroxy: 53.8 ng/mL (ref 30.0–100.0)

## 2021-12-14 MED ORDER — VITAMIN D (ERGOCALCIFEROL) 1.25 MG (50000 UNIT) PO CAPS: 50000.0000 [IU] | ORAL_CAPSULE | ORAL | 0 refills | Status: DC

## 2021-12-14 NOTE — Progress Notes (Signed)
Chief Complaint:   OBESITY Jeanne Velez is here to discuss her progress with her obesity treatment plan along with follow-up of her obesity related diagnoses. Jeanne Velez is on keeping a food journal and adhering to recommended goals of 1200-1400 calories and 90 grams of protein and states she is following her eating plan approximately 25% of the time. Jeanne Velez states she is walking for 30 minutes 4 times per week.  Today's visit was #: 12 Starting weight: 220 lbs Starting date: 03/30/2021 Today's weight: 203 lbs Today's date: 12/14/2021 Total lbs lost to date: 17 lbs Total lbs lost since last in-office visit: 3 lbs  Interim History:  Last weekend her husband brought home a rescue dog "Jeanne Velez". She was very uncomfortable with the dog and it caused extreme anxiety and triggered a strong negative emotional reaction. Her husband returned "Jeanne Velez" yesterday to the dog farm. Since her reaction was so pronounced, she would like a referral BH/Therapy.   Subjective:   1. Vitamin D deficiency Vitamin D level on 07/22/2021 - 57.1. Last office visit was MyChart - unable to check labs.  2. Pre-diabetes Maternal grandfather had T2D. She has never been on any BG lowering medications. Last office visit was MyChart - unable to check labs.  3. Stress Last weekend her husband brought home a rescue dog "Jeanne Velez". She was very uncomfortable with the dog and it caused extreme anxiety and triggered a strong negative emotional reaction. Her husband returned "Jeanne Velez" yesterday to the dog farm. Since her reaction was so pronounced, she would like a referral BH/Therapy.  4. At risk for anxiety Karnisha is at risk of developing anxiety due to increased levels of stress.  Referral to [facility] placed.  Assessment/Plan:   1. Vitamin D deficiency Check labs, MyChart patient with results and refill ergocalciferol as appropriate.  - VITAMIN D 25 Hydroxy (Vit-D Deficiency, Fractures)  2. Pre-diabetes Check labs today.  -  Hemoglobin A1c  3. Stress Referral to [facility].  - Ambulatory referral to Psychology  4. At risk for anxiety Jeanne Velez was given approximately 15 minutes of anxiety risk counseling today.  We discussed the importance of a healthy work life balance, a healthy relationship with food and a good support system.  Repetitive spaced learning was employed today to elicit superior memory formation and behavioral change.  5. Obesity with current BMI 30.9  Jeanne Velez is currently in the action stage of change. As such, her goal is to continue with weight loss efforts. She has agreed to the Category 2 Plan and keeping a food journal and adhering to recommended goals of 400-500 calories and 35 grams of protein at supper.   Exercise goals:  As is.  Behavioral modification strategies: increasing lean protein intake, decreasing simple carbohydrates, meal planning and cooking strategies, keeping healthy foods in the home, and planning for success.  Jeanne Velez has agreed to follow-up with our clinic in 3 weeks. She was informed of the importance of frequent follow-up visits to maximize her success with intensive lifestyle modifications for her multiple health conditions.   Objective:   Blood pressure 111/70, pulse 82, temperature 98 F (36.7 C), height 5\' 8"  (1.727 m), weight 203 lb (92.1 kg), SpO2 97 %. Body mass index is 30.87 kg/m.  General: Cooperative, alert, well developed, in no acute distress. HEENT: Conjunctivae and lids unremarkable. Cardiovascular: Regular rhythm.  Lungs: Normal work of breathing. Neurologic: No focal deficits.   Lab Results  Component Value Date   CREATININE 0.74 07/22/2021   BUN  17 07/22/2021   NA 141 07/22/2021   K 4.4 07/22/2021   CL 101 07/22/2021   CO2 25 07/22/2021   Lab Results  Component Value Date   ALT 15 07/22/2021   AST 18 07/22/2021   ALKPHOS 97 07/22/2021   BILITOT 0.8 07/22/2021   Lab Results  Component Value Date   HGBA1C 5.7 (H) 07/22/2021   HGBA1C  5.7 (H) 03/30/2021   HGBA1C 5.6 08/11/2020   Lab Results  Component Value Date   INSULIN 4.7 07/22/2021   INSULIN 6.7 03/30/2021   Lab Results  Component Value Date   TSH 0.954 03/30/2021   Lab Results  Component Value Date   CHOL 181 07/22/2021   HDL 42 07/22/2021   LDLCALC 108 (H) 07/22/2021   TRIG 175 (H) 07/22/2021   CHOLHDL 4.3 07/22/2021   Lab Results  Component Value Date   VD25OH 57.1 07/22/2021   VD25OH 23.6 (L) 03/30/2021   Lab Results  Component Value Date   WBC 6.9 03/30/2021   HGB 13.8 03/30/2021   HCT 40.9 03/30/2021   MCV 91 03/30/2021   PLT 247 10/10/2020   Attestation Statements:   Reviewed by clinician on day of visit: allergies, medications, problem list, medical history, surgical history, family history, social history, and previous encounter notes.  I, Water quality scientist, CMA, am acting as Location manager for Mina Marble, NP.  I have reviewed the above documentation for accuracy and completeness, and I agree with the above. -  Leilan Bochenek d. Maleya Leever, NP-C

## 2022-01-04 ENCOUNTER — Ambulatory Visit (INDEPENDENT_AMBULATORY_CARE_PROVIDER_SITE_OTHER): Payer: 59 | Admitting: Adult Health

## 2022-01-06 ENCOUNTER — Other Ambulatory Visit: Payer: Self-pay

## 2022-01-06 ENCOUNTER — Ambulatory Visit: Payer: PRIVATE HEALTH INSURANCE | Admitting: Dermatology

## 2022-01-06 DIAGNOSIS — D2362 Other benign neoplasm of skin of left upper limb, including shoulder: Secondary | ICD-10-CM

## 2022-01-06 DIAGNOSIS — D1800 Hemangioma unspecified site: Secondary | ICD-10-CM

## 2022-01-06 DIAGNOSIS — L739 Follicular disorder, unspecified: Secondary | ICD-10-CM

## 2022-01-06 DIAGNOSIS — L578 Other skin changes due to chronic exposure to nonionizing radiation: Secondary | ICD-10-CM

## 2022-01-06 DIAGNOSIS — L821 Other seborrheic keratosis: Secondary | ICD-10-CM

## 2022-01-06 DIAGNOSIS — L738 Other specified follicular disorders: Secondary | ICD-10-CM

## 2022-01-06 DIAGNOSIS — L814 Other melanin hyperpigmentation: Secondary | ICD-10-CM

## 2022-01-06 DIAGNOSIS — D229 Melanocytic nevi, unspecified: Secondary | ICD-10-CM

## 2022-01-06 DIAGNOSIS — L720 Epidermal cyst: Secondary | ICD-10-CM | POA: Diagnosis not present

## 2022-01-06 DIAGNOSIS — L219 Seborrheic dermatitis, unspecified: Secondary | ICD-10-CM

## 2022-01-06 DIAGNOSIS — B078 Other viral warts: Secondary | ICD-10-CM

## 2022-01-06 DIAGNOSIS — D2339 Other benign neoplasm of skin of other parts of face: Secondary | ICD-10-CM

## 2022-01-06 DIAGNOSIS — D369 Benign neoplasm, unspecified site: Secondary | ICD-10-CM

## 2022-01-06 DIAGNOSIS — L700 Acne vulgaris: Secondary | ICD-10-CM

## 2022-01-06 DIAGNOSIS — D2372 Other benign neoplasm of skin of left lower limb, including hip: Secondary | ICD-10-CM

## 2022-01-06 MED ORDER — TRETINOIN 0.025 % EX CREA: TOPICAL_CREAM | Freq: Every day | CUTANEOUS | 5 refills | Status: DC

## 2022-01-06 MED ORDER — DOXYCYCLINE HYCLATE 20 MG PO TABS: 20.0000 mg | ORAL_TABLET | Freq: Two times a day (BID) | ORAL | 11 refills | Status: AC

## 2022-01-06 NOTE — Progress Notes (Signed)
Follow-Up Visit   Subjective  Jeanne Velez is a 61 y.o. female who presents for the following: FBSE (Patient here for full body skin exam and skin cancer screening. Patient no personal hx of skin cancer but there is a fhx of skin cancer. Patient not concerned about any new or changing spots. ) and Acne (Patient taking doxycycline 20 mg 1-2 times daily for acne. Also using tretinoin 0.025% nightly and hypochlorous spray twice daily. ).  Patient does have a spot at labia that is getting larger.  Patient has had thyroid cancer and precancer colon polyps.   The following portions of the chart were reviewed this encounter and updated as appropriate:   Tobacco   Allergies   Meds   Problems   Med Hx   Surg Hx   Fam Hx       Review of Systems:  No other skin or systemic complaints except as noted in HPI or Assessment and Plan.  Objective  Well appearing patient in no apparent distress; mood and affect are within normal limits.  A full examination was performed including scalp, head, eyes, ears, nose, lips, neck, chest, axillae, abdomen, back, buttocks, bilateral upper extremities, bilateral lower extremities, hands, feet, fingers, toes, fingernails, and toenails. All findings within normal limits unless otherwise noted below.  face Trace open comedones  left outer labia majora 0.5 cm white smooth papule  vaginal area Perifollicular erythematous papules   face Small yellow papules with a central dell.   face Multiple smooth top papules scattered over nose and lower face  mid frontal scalp Verrucous papules -- Discussed viral etiology and contagion.     Assessment & Plan  Acne vulgaris face  Continue tretinoin 0.025% cream nightly. Continue doxycycline 20 mg twice daily.  Continue hypochlorous spray daily.   Doxycycline should be taken with food to prevent nausea. Do not lay down for 30 minutes after taking. Be cautious with sun exposure and use good sun protection while on  this medication. Pregnant women should not take this medication.   Topical retinoid medications like tretinoin/Retin-A, adapalene/Differin, tazarotene/Fabior, and Epiduo/Epiduo Forte can cause dryness and irritation when first started. Only apply a pea-sized amount to the entire affected area. Avoid applying it around the eyes, edges of mouth and creases at the nose. If you experience irritation, use a good moisturizer first and/or apply the medicine less often. If you are doing well with the medicine, you can increase how often you use it until you are applying every night. Be careful with sun protection while using this medication as it can make you sensitive to the sun. This medicine should not be used by pregnant women.    Chronic condition with duration or expected duration over one year. Currently well-controlled.   doxycycline (PERIOSTAT) 20 MG tablet - face Take 1 tablet (20 mg total) by mouth 2 (two) times daily.  Related Medications tretinoin (RETIN-A) 0.025 % cream Apply topically at bedtime.  Epidermal inclusion cyst left outer labia majora  Benign-appearing. Exam most consistent with an epidermal inclusion cyst. Discussed that a cyst is a benign growth that can grow over time and sometimes get irritated or inflamed. Recommend observation if it is not bothersome. Discussed option of surgical excision to remove it if it is growing, symptomatic, or other changes noted. Please call for new or changing lesions so they can be evaluated.    Folliculitis vaginal area  Recommend hypochlorous spray daily avoiding getting inside vagina  Sebaceous hyperplasia face  Benign-appearing.  Observation.  Call clinic for new or changing lesions.    Angiofibroma face  Benign-appearing.  Observation.  Call clinic for new or changing lesions.  Recommend daily use of broad spectrum spf 30+ sunscreen to sun-exposed areas.    Other viral warts mid frontal scalp  Discussed viral etiology  and risk of spread.  Discussed multiple treatments may be required to clear warts.  Discussed possible post-treatment dyspigmentation and risk of recurrence.  Prior to procedure, discussed risks of blister formation, small wound, skin dyspigmentation, or rare scar following cryotherapy. Recommend Vaseline ointment to treated areas while healing.   Destruction of lesion - mid frontal scalp  Destruction method: cryotherapy   Informed consent: discussed and consent obtained   Lesion destroyed using liquid nitrogen: Yes   Cryotherapy cycles:  2 Outcome: patient tolerated procedure well with no complications   Post-procedure details: wound care instructions given    Seborrheic dermatitis  Related Medications doxycycline (PERIOSTAT) 20 MG tablet Take 1 tablet (20 mg total) by mouth 2 (two) times daily. For acne. Take with food   Lentigines - Scattered tan macules - Due to sun exposure - Benign-appearing, observe - Recommend daily broad spectrum sunscreen SPF 30+ to sun-exposed areas, reapply every 2 hours as needed. - Call for any changes  Seborrheic Keratoses - Stuck-on, waxy, tan-brown papules and/or plaques  - Benign-appearing - Discussed benign etiology and prognosis. - Observe - Call for any changes  Melanocytic Nevi - Tan-brown and/or pink-flesh-colored symmetric macules and papules - Benign appearing on exam today - Observation - Call clinic for new or changing moles - Recommend daily use of broad spectrum spf 30+ sunscreen to sun-exposed areas.   Hemangiomas - Red papules - Discussed benign nature - Observe - Call for any changes  Actinic Damage - Chronic condition, secondary to cumulative UV/sun exposure - diffuse scaly erythematous macules with underlying dyspigmentation - Recommend daily broad spectrum sunscreen SPF 30+ to sun-exposed areas, reapply every 2 hours as needed.  - Staying in the shade or wearing long sleeves, sun glasses (UVA+UVB protection) and  wide brim hats (4-inch brim around the entire circumference of the hat) are also recommended for sun protection.  - Call for new or changing lesions.  Skin cancer screening performed today.  Dermatofibroma - Firm pink/brown papulenodule with dimple sign at left pretibia, left medial and lateral thigh, left upper arm - Benign appearing - Call for any changes  Return in about 1 year (around 01/06/2023) for TBSE.  Graciella Belton, RMA, am acting as scribe for Forest Gleason, MD .  Documentation: I have reviewed the above documentation for accuracy and completeness, and I agree with the above.  Forest Gleason, MD

## 2022-01-06 NOTE — Patient Instructions (Addendum)
Cryotherapy Aftercare  Wash gently with soap and water everyday.   Apply Vaseline and Band-Aid daily until healed.   Recommend taking Heliocare sun protection supplement daily in sunny weather for additional sun protection. For maximum protection on the sunniest days, you can take up to 2 capsules of regular Heliocare OR take 1 capsule of Heliocare Ultra. For prolonged exposure (such as a full day in the sun), you can repeat your dose of the supplement 4 hours after your first dose. Heliocare can be purchased at [employer], at some [facility] or at [email or web address].    Melanoma ABCDEs  Melanoma is the most dangerous type of skin cancer, and is the leading cause of death from skin disease.  You are more likely to develop melanoma if you: Have light-colored skin, light-colored eyes, or red or blond hair Spend a lot of time in the sun Tan regularly, either outdoors or in a tanning bed Have had blistering sunburns, especially during childhood Have a close family member who has had a melanoma Have atypical moles or large birthmarks  Early detection of melanoma is key since treatment is typically straightforward and cure rates are extremely high if we catch it early.   The first sign of melanoma is often a change in a mole or a new dark spot.  The ABCDE system is a way of remembering the signs of melanoma.  A for asymmetry:  The two halves do not match. B for border:  The edges of the growth are irregular. C for color:  A mixture of colors are present instead of an even brown color. D for diameter:  Melanomas are usually (but not always) greater than 50mm - the size of a pencil eraser. E for evolution:  The spot keeps changing in size, shape, and color.  Please check your skin once per month between visits. You can use a small mirror in front and a large mirror behind you to keep an eye on the back side or your body.   If you see any new or changing lesions before your next follow-up,  please call to schedule a visit.  Please continue daily skin protection including broad spectrum sunscreen SPF 30+ to sun-exposed areas, reapplying every 2 hours as needed when you're outdoors.    If You Need Anything After Your Visit  If you have any questions or concerns for your doctor, please call our main line at [phone] and press option 4 to reach your doctor's medical assistant. If no one answers, please leave a voicemail as directed and we will return your call as soon as possible. Messages left after 4 pm will be answered the following business day.   You may also send [place] a message via [place]. We typically respond to MyChart messages within 1-2 business days.  For prescription refills, please ask your pharmacy to contact our office. Our fax number is [phone].  If you have an urgent issue when the clinic is closed that cannot wait until the next business day, you can page your doctor at the number below.    Please note that while we do our best to be available for urgent issues outside of office hours, we are not available 24/7.   If you have an urgent issue and are unable to reach [place], you may choose to seek medical care at your doctor's office, retail clinic, urgent care center, or emergency room.  If you have a medical emergency, please immediately call 911 or go to the  emergency department.  Pager Numbers  - Dr. Nehemiah Massed: [phone]  - Dr. Laurence Ferrari: [phone]  - Dr. Nicole Kindred: [phone]  In the event of inclement weather, please call our main line at [phone] for an update on the status of any delays or closures.  Dermatology Medication Tips: Please keep the boxes that topical medications come in in order to help keep track of the instructions about where and how to use these. Pharmacies typically print the medication instructions only on the boxes and not directly on the medication tubes.   If your medication is too expensive, please contact our office at  [phone] option 4 or send [place] a message through [place].   We are unable to tell what your co-pay for medications will be in advance as this is different depending on your insurance coverage. However, we may be able to find a substitute medication at lower cost or fill out paperwork to get insurance to cover a needed medication.   If a prior authorization is required to get your medication covered by your insurance company, please allow [place] 1-2 business days to complete this process.  Drug prices often vary depending on where the prescription is filled and some pharmacies may offer cheaper prices.  The website www.goodrx.com contains coupons for medications through different pharmacies. The prices here do not account for what the cost may be with help from insurance (it may be cheaper with your insurance), but the website can give you the price if you did not use any insurance.  - You can print the associated coupon and take it with your prescription to the pharmacy.  - You may also stop by our office during regular business hours and pick up a GoodRx coupon card.  - If you need your prescription sent electronically to a different pharmacy, notify our office through [facility] or by phone at [phone] option 4.     Si Usted Necesita Algo Despus de Su Visita  Tambin puede enviarnos un mensaje a travs de Pharmacist, community. Por lo general respondemos a los mensajes de MyChart en el transcurso de 1 a 2 das hbiles.  Para renovar recetas, por favor pida a su farmacia que se ponga en contacto con nuestra oficina. Harland Dingwall de fax es [place] [phone].  Si tiene un asunto urgente cuando la clnica est cerrada y que no puede esperar hasta el siguiente da hbil, puede llamar/localizar a su doctor(a) al nmero que aparece a continuacin.   Por favor, tenga en cuenta que aunque hacemos todo lo posible para estar disponibles para asuntos urgentes fuera del horario de [place], no estamos  disponibles las 24 horas del da, los 7 das de la [place].   Si tiene un problema urgente y no puede comunicarse con nosotros, puede optar por buscar atencin mdica  en el consultorio de su doctor(a), en una clnica privada, en un centro de atencin urgente o en una sala de emergencias.  Si tiene Engineering geologist, por favor llame inmediatamente al 911 o vaya a la sala de emergencias.  Nmeros de bper  - Dr. Nehemiah Massed: [phone]  - Dra. Moye: [phone]  - Dra. Nicole Kindred: [phone]  En caso de inclemencias del [place], por favor llame a Johnsie Kindred principal al [phone] para una actualizacin sobre el [place] de cualquier retraso o cierre.  Consejos para la medicacin en dermatologa: Por favor, guarde las cajas en las que vienen los medicamentos de uso tpico para ayudarle a seguir las instrucciones sobre dnde y cmo usarlos. Las  farmacias generalmente imprimen las instrucciones del medicamento slo en las cajas y no directamente en los tubos del [place].   Si su medicamento es muy caro, por favor, pngase en contacto con Zigmund Daniel llamando al [phone] y presione la opcin 4 o envenos un mensaje a travs de Pharmacist, community.   No podemos decirle cul ser su copago por los medicamentos por adelantado ya que esto es diferente dependiendo de la cobertura de su seguro. Sin embargo, es posible que podamos encontrar un medicamento sustituto a Electrical engineer un formulario para que el seguro cubra el medicamento que se considera necesario.   Si se requiere una autorizacin previa para que su compaa de seguros [place] su medicamento, por favor permtanos de 1 a 2 das hbiles para completar este proceso.  Los precios de los medicamentos varan con frecuencia dependiendo del Environmental consultant de dnde se surte la receta y alguna farmacias pueden ofrecer precios ms baratos.  El sitio web www.goodrx.com tiene cupones para medicamentos de Airline pilot. Los precios aqu no  tienen en cuenta lo que podra costar con la ayuda del seguro (puede ser ms barato con su seguro), pero el sitio web puede darle el precio si no utiliz Research scientist (physical sciences).  - Puede imprimir el cupn correspondiente y llevarlo con su receta a la farmacia.  - Tambin puede pasar por nuestra oficina durante el horario de atencin regular y Charity fundraiser una tarjeta de cupones de GoodRx.  - Si necesita que su receta se enve electrnicamente a una farmacia diferente, informe a nuestra oficina a travs de MyChart de [place] o por telfono llamando al [phone] y presione la opcin 4.

## 2022-01-10 ENCOUNTER — Encounter: Payer: Self-pay | Admitting: Dermatology

## 2022-01-10 ENCOUNTER — Other Ambulatory Visit (INDEPENDENT_AMBULATORY_CARE_PROVIDER_SITE_OTHER): Payer: Self-pay | Admitting: Adult Health

## 2022-01-10 DIAGNOSIS — E559 Vitamin D deficiency, unspecified: Secondary | ICD-10-CM

## 2022-01-17 NOTE — Telephone Encounter (Signed)
Medical records reviewed. Previously biopsies benign (fibrous papule, verruca, SK). History of acne. Family history of melanoma in father, daughter and aunt.  ?

## 2022-01-20 ENCOUNTER — Telehealth: Payer: Self-pay | Admitting: Dermatology

## 2022-01-20 ENCOUNTER — Other Ambulatory Visit: Payer: Self-pay

## 2022-01-20 DIAGNOSIS — Z808 Family history of malignant neoplasm of other organs or systems: Secondary | ICD-10-CM

## 2022-01-20 NOTE — Telephone Encounter (Signed)
Spoke with patient. 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 Specifically would consider the possibility of Cowden's or any other mutations that could predispose to these malignancies.  ? ?Patient is in agreement. ? ?MAs please place referral to [place] Thank you! ? ?

## 2022-01-21 ENCOUNTER — Telehealth: Payer: Self-pay | Admitting: Genetic Counselor

## 2022-01-21 NOTE — Telephone Encounter (Signed)
Scheduled appt per 3/9 referral. Pt is aware of appt date and time. Pt is aware to arrive 15 mins prior to appt time and to bring and updated insurance card. Pt is aware of appt location.   ?

## 2022-01-25 ENCOUNTER — Encounter (INDEPENDENT_AMBULATORY_CARE_PROVIDER_SITE_OTHER): Payer: Self-pay | Admitting: Adult Health

## 2022-01-25 ENCOUNTER — Ambulatory Visit (INDEPENDENT_AMBULATORY_CARE_PROVIDER_SITE_OTHER): Payer: 59 | Admitting: Adult Health

## 2022-01-25 ENCOUNTER — Other Ambulatory Visit: Payer: Self-pay

## 2022-01-25 VITALS — BP 139/81 | HR 84 | Temp 98.1°F | Ht 68.0 in | Wt 211.0 lb

## 2022-01-25 DIAGNOSIS — Z6832 Body mass index (BMI) 32.0-32.9, adult: Secondary | ICD-10-CM | POA: Diagnosis not present

## 2022-01-25 DIAGNOSIS — E559 Vitamin D deficiency, unspecified: Secondary | ICD-10-CM

## 2022-01-25 DIAGNOSIS — R7303 Prediabetes: Secondary | ICD-10-CM | POA: Diagnosis not present

## 2022-01-25 DIAGNOSIS — E669 Obesity, unspecified: Secondary | ICD-10-CM

## 2022-01-25 DIAGNOSIS — Z6833 Body mass index (BMI) 33.0-33.9, adult: Secondary | ICD-10-CM

## 2022-01-25 DIAGNOSIS — Z9189 Other specified personal risk factors, not elsewhere classified: Secondary | ICD-10-CM

## 2022-01-26 MED ORDER — VITAMIN D (ERGOCALCIFEROL) 1.25 MG (50000 UNIT) PO CAPS: 50000.0000 [IU] | ORAL_CAPSULE | ORAL | 0 refills | Status: DC

## 2022-01-26 NOTE — Progress Notes (Signed)
? ? ? ?Chief Complaint:  ? ?OBESITY ?Jeanne Velez is here to discuss her progress with her obesity treatment plan along with follow-up of her obesity related diagnoses. Jeanne Velez is on the Category 2 Plan and keeping a food journal and adhering to recommended goals of 400-500 calories and 35 grams of protein at supper and states she is following her eating plan approximately 50-60% of the time. Jeanne Velez states she is not exercising regularly at this time. ? ?Today's visit was #: 13 ?Starting weight: 220 lbs ?Starting date: 03/30/2021 ?Today's weight: 211 lbs ?Today's date: 01/25/2022 ?Total lbs lost to date: 9 lbs ?Total lbs lost since last in-office visit: 0 ? 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  Work should calm down mid May. ?House guests for 2 weeks - friends from [place]. ?She endorses difficulty following meal plan while entertaining international guests. ?Not tracking intake consistently, rather focused on increasing protein at each meal. ? ?Subjective:  ? ?1. Vitamin D deficiency ?On 12/13/2021, vitamin D level - 53.8 - stable. ?She is currently taking prescription ergocalciferol 50,000 IU each week. She denies nausea, vomiting or muscle weakness. ? ?2. Pre-diabetes ?On 12/13/2021, A1c 5.6 - improved from 5.7 on 04/28/2021. ?She is not on any BG lowering medications at time of labs. ? ?3. At risk for osteoporosis ?Jeanne Velez is at higher risk of osteopenia and osteoporosis due to Vitamin D deficiency and obesity.  ? ?Assessment/Plan:  ? ?1. Vitamin D deficiency ?Refill ergocalciferol 50,000 IU once weekly, dispense 4 caps, 0 refills. ? ?2. Pre-diabetes ?Category 2 Meal Plan, increase regular exercise. ? ?3. At risk for osteoporosis ?Jeanne Velez was given approximately 15 minutes of osteoporosis prevention counseling today. Jeanne Velez is at risk for osteopenia and osteoporosis due to her Vitamin D deficiency. She was encouraged to take her Vit D and follow her higher calcium diet and  increase strengthening exercise to help strengthen her bones and decrease her risk of osteopenia and osteoporosis. ? ?4. Obesity with current BMI 32.1 ? ?Jeanne Velez is currently in the action stage of change. As such, her goal is to continue with weight loss efforts. She has agreed to the Category 2 Plan and keeping a food journal and adhering to recommended goals of 400-500 calories and 35 grams of protein at supper.  ? ?Exercise goals:  As is. ? ?Behavioral modification strategies: increasing lean protein intake, decreasing simple carbohydrates, meal planning and cooking strategies, keeping healthy foods in the home, and planning for success. ? ?Jeanne Velez has agreed to follow-up with our clinic in 3 weeks. She was informed of the importance of frequent follow-up visits to maximize her success with intensive lifestyle modifications for her multiple health conditions.  ? ?Objective:  ? ?Blood pressure 139/81, pulse 84, temperature 98.1 ?F (36.7 ?C), height '5\' 8"'$  (1.727 m), weight 211 lb (95.7 kg), SpO2 100 %. ?Body mass index is 32.08 kg/m?. ? ?General: Cooperative, alert, well developed, in no acute distress. ?HEENT: Conjunctivae and lids unremarkable. ?Cardiovascular: Regular rhythm.  ?Lungs: Normal work of breathing. ?Neurologic: No focal deficits.  ? ?Lab Results  ?Component Value Date  ? CREATININE 0.74 07/22/2021  ? BUN 17 07/22/2021  ? NA 141 07/22/2021  ? K 4.4 07/22/2021  ? CL 101 07/22/2021  ? CO2 25 07/22/2021  ? ?Lab Results  ?Component Value Date  ? ALT 15 07/22/2021  ? AST 18 07/22/2021  ? ALKPHOS 97 07/22/2021  ? BILITOT 0.8 07/22/2021  ? ?Lab Results  ?Component  Value Date  ? HGBA1C 5.6 12/13/2021  ? HGBA1C 5.7 (H) 07/22/2021  ? HGBA1C 5.7 (H) 03/30/2021  ? HGBA1C 5.6 08/11/2020  ? ?Lab Results  ?Component Value Date  ? INSULIN 4.7 07/22/2021  ? INSULIN 6.7 03/30/2021  ? ?Lab Results  ?Component Value Date  ? TSH 0.954 03/30/2021  ? ?Lab Results  ?Component Value Date  ? CHOL 181 07/22/2021  ? HDL 42 07/22/2021   ? LDLCALC 108 (H) 07/22/2021  ? TRIG 175 (H) 07/22/2021  ? CHOLHDL 4.3 07/22/2021  ? ?Lab Results  ?Component Value Date  ? VD25OH 53.8 12/13/2021  ? VD25OH 57.1 07/22/2021  ? VD25OH 23.6 (L) 03/30/2021  ? ?Lab Results  ?Component Value Date  ? WBC 6.9 03/30/2021  ? HGB 13.8 03/30/2021  ? HCT 40.9 03/30/2021  ? MCV 91 03/30/2021  ? PLT 247 10/10/2020  ? ?Attestation Statements:  ? ?Reviewed by clinician on day of visit: allergies, medications, problem list, medical history, surgical history, family history, social history, and previous encounter notes. ? ?I, Water quality scientist, CMA, am acting as Location manager for Mina Marble, NP. ? ?I have reviewed the above documentation for accuracy and completeness, and I agree with the above. -  Christopherjohn Schiele d. Irja Wheless, NP-C ?

## 2022-02-16 ENCOUNTER — Encounter (INDEPENDENT_AMBULATORY_CARE_PROVIDER_SITE_OTHER): Payer: Self-pay | Admitting: Adult Health

## 2022-02-16 ENCOUNTER — Ambulatory Visit (INDEPENDENT_AMBULATORY_CARE_PROVIDER_SITE_OTHER): Payer: 59 | Admitting: Adult Health

## 2022-02-16 VITALS — BP 117/75 | HR 78 | Temp 98.3°F | Ht 68.0 in | Wt 209.0 lb

## 2022-02-16 DIAGNOSIS — Z6833 Body mass index (BMI) 33.0-33.9, adult: Secondary | ICD-10-CM

## 2022-02-16 DIAGNOSIS — Z6831 Body mass index (BMI) 31.0-31.9, adult: Secondary | ICD-10-CM

## 2022-02-16 DIAGNOSIS — E559 Vitamin D deficiency, unspecified: Secondary | ICD-10-CM

## 2022-02-16 DIAGNOSIS — E669 Obesity, unspecified: Secondary | ICD-10-CM

## 2022-02-16 MED ORDER — VITAMIN D (ERGOCALCIFEROL) 1.25 MG (50000 UNIT) PO CAPS: 50000.0000 [IU] | ORAL_CAPSULE | ORAL | 0 refills | Status: DC

## 2022-02-24 ENCOUNTER — Inpatient Hospital Stay: Payer: PRIVATE HEALTH INSURANCE | Attending: Genetic Counselor | Admitting: Genetic Counselor

## 2022-02-24 ENCOUNTER — Inpatient Hospital Stay: Payer: PRIVATE HEALTH INSURANCE

## 2022-02-24 ENCOUNTER — Other Ambulatory Visit: Payer: Self-pay

## 2022-02-24 ENCOUNTER — Encounter: Payer: Self-pay | Admitting: Genetic Counselor

## 2022-02-24 DIAGNOSIS — Z85850 Personal history of malignant neoplasm of thyroid: Secondary | ICD-10-CM

## 2022-02-24 DIAGNOSIS — Z86010 Personal history of colonic polyps: Secondary | ICD-10-CM | POA: Diagnosis not present

## 2022-02-24 DIAGNOSIS — Z808 Family history of malignant neoplasm of other organs or systems: Secondary | ICD-10-CM

## 2022-02-24 DIAGNOSIS — Z803 Family history of malignant neoplasm of breast: Secondary | ICD-10-CM | POA: Diagnosis not present

## 2022-02-24 HISTORY — DX: Family history of malignant neoplasm of other organs or systems: Z80.8

## 2022-02-24 NOTE — Progress Notes (Signed)
REFERRING PROVIDER: ?Alfonso Patten, MD ?[address] ?[place],  [place] [address] ? ?PRIMARY PROVIDER:  ?Jearld Fenton, NP ? ?PRIMARY REASON FOR VISIT:  ?1. History of colonic polyps   ?2. History of thyroid cancer   ?3. Family history of skin cancer   ?4. Family history of breast cancer   ? ? ?HISTORY OF PRESENT ILLNESS:   ?Jeanne Velez, a 61 y.o. female, was seen for a [place] cancer genetics consultation at the request of Dr. Laurence Ferrari due to a personal and family history of cancer.  Jeanne Velez presents to clinic today to discuss the possibility of a hereditary predisposition to cancer, to discuss genetic testing, and to further clarify her future cancer risks, as well as potential cancer risks for family members.  ? ?In her 52s, Jeanne Velez was diagnosed with thyroid cancer s/p thyroidectomy.  She could not recall if this was papillary, follicular, or medullary.  ? ?Jeanne Velez had negative hereditary cancer genetic testing through [facility].  The [facility] gene panel offered by [employer] includes sequencing and deletion/duplication testing of the following 35 genes: APC, ATM, AXIN2, BARD1, BMPR1A, BRCA1, BRCA2, BRIP1, CHD1, CDK4, CDKN2A, CHEK2, EPCAM (large rearrangement only), HOXB13, (sequencing only), GALNT12, MLH1, MSH2, MSH3 (excluding repetitive portions of exon 1), MSH6, MUTYH, NBN, NTHL1, PALB2, PMS2, PTEN, RAD51C, RAD51D, RNF43, RPS20, SMAD4, STK11, and TP53. Sequencing was performed for select regions of POLE and POLD1, and large rearrangement analysis was performed for select regions of GREM1.  The report date is August 05, 2020.  ? ?RISK FACTORS:  ?Mammogram within the last year: yes ?Number of breast biopsies: 1; more than 15 years ago; benign per patient ?Colonoscopy: yes;  most recent in 2020; reports ~10 lifetime colon polyps . ?Hysterectomy: yes in 46s ?Ovaries intact: no; removed in 32s ?Menarche was at age 65.  ?First live birth at age 54.  ?OCP use for more than  10 years.  ?HRT use: current estradiol use; more than 10 years ?Dermatology screening: annually  ?Reported exposure to nuclear plant in childhood.  ? ?Past Medical History:  ?Diagnosis Date  ? Acne   ? Allergy   ? Anemia   ? Back pain   ? BRCA negative 07/2020  ? MyRisk neg  ? Cataracts, bilateral   ? Colon polyp   ? Constipation   ? Family history of breast cancer 07/2020  ? IBIS=15.1%/riskscore=6.7%  ? Family history of skin cancer 02/24/2022  ? Folliculitis   ? GERD (gastroesophageal reflux disease)   ? Hyperlipidemia   ? Hypothyroidism   ? Joint pain   ? Macular degeneration   ? Osteoarthritis   ? Retinal vasculitis   ? Stress   ? Thyroid cancer ([place])   ? ? ?Past Surgical History:  ?Procedure Laterality Date  ? BREAST BIOPSY Right   ? yrs ago benign, no marker  ? FLEXIBLE SIGMOIDOSCOPY N/A 04/28/2021  ? Procedure: FLEXIBLE SIGMOIDOSCOPY;  Surgeon: Lin Landsman, MD;  Location: [facility] ENDOSCOPY;  Service: Gastroenterology;  Laterality: N/A;  ? ovarial cystectomy    ? THYROIDECTOMY    ? TOTAL ABDOMINAL HYSTERECTOMY W/ BILATERAL SALPINGOOPHORECTOMY    ? dermoids, ovar cysts  ? TUBAL LIGATION    ? ? ?FAMILY HISTORY:  ?We obtained a detailed, 4-generation family history.  Significant diagnoses are listed below: ?Family History  ?Problem Relation Age of Onset  ? Skin cancer Father   ?     dx 74s; face/scalp  ? Bone cancer Maternal Aunt 60  ?  Melanoma Maternal Aunt   ?     dx 23s; bottom of foot  ? Breast cancer Maternal Grandmother   ?     dx unknown age  ? Skin cancer Maternal Grandmother   ?     dx unknown age  ? Breast cancer Cousin   ?     dx 80s; paternal female cousin  ? Melanoma Cousin   ?     dx 41s; paternal female cousin  ? ? ? ?Ms. [name] is unaware of previous family history of genetic testing for hereditary cancer risks. There is no reported Ashkenazi Jewish ancestry. There is no known consanguinity. ? ?GENETIC COUNSELING ASSESSMENT: Jeanne Velez is a 61 y.o. female with a personal history of polyps  which is somewhat suggestive of a hereditary polyposis syndrome and a family history of cancer which is somewhat suggestive of a predisposition to cancer. We, therefore, discussed and recommended the following at today's visit.  ? ?DISCUSSION: We discussed that 5 - 10% of cancer is hereditary.  Hereditary polyposis can be associated with mutations in the APC, MUTYH, and other genes.  Hereditary melanoma can be associated with mutations in the CDKN2A, BRCA2, MITF, and other cancers. We discussed that testing is beneficial for several reasons including knowing how to follow individuals for their cancer risks, identifying whether potential treatment options would be beneficial, and understanding if other family members could be at risk for cancer and allowing them to undergo genetic testing.  ? ?We reviewed the characteristics, features and inheritance patterns of hereditary cancer syndromes. We also discussed genetic testing, including the appropriate family members to test, the process of testing, insurance coverage and turn-around-time for results. We discussed the implications of a negative, positive, carrier and/or variant of uncertain significant result. We reviewed Ms. Nghiem previous genetic testing results and discussed that it contained most genes associated with breast cancer, colon polyps, and some melanoma genes.  We discussed that these negative results could mean that the personal and/or family history of cancer was sporadic, caused by a mutation she did not inherit, or caused by a mutation unable to be detected by previous genetic testing. We discussed that it is reasonable to pursue more comprehensive genetic testing to include RNA analysis as well as skin-cancer related cancers that were not included on her initial panel, including MITF, BAP1, and POT1.  We discussed that RNA analysis can, in rare cases, detect pathogenic variants missed by DNA analysis alone.  ? ?The CancerNext-Expanded gene panel  offered by [facility] and includes sequencing, rearrangement, and RNA analysis for the following 77 genes: AIP, ALK, APC, ATM, AXIN2, BAP1, BARD1, BLM, BMPR1A, BRCA1, BRCA2, BRIP1, CDC73, CDH1, CDK4, CDKN1B, CDKN2A, CHEK2, CTNNA1, DICER1, FANCC, FH, FLCN, GALNT12, KIF1B, LZTR1, MAX, MEN1, MET, MLH1, MSH2, MSH3, MSH6, MUTYH, NBN, NF1, NF2, NTHL1, PALB2, PHOX2B, PMS2, POT1, PRKAR1A, PTCH1, PTEN, RAD51C, RAD51D, RB1, RECQL, RET, SDHA, SDHAF2, SDHB, SDHC, SDHD, SMAD4, SMARCA4, SMARCB1, SMARCE1, STK11, SUFU, TMEM127, TP53, TSC1, TSC2, VHL and XRCC2 (sequencing and deletion/duplication); EGFR, EGLN1, HOXB13, KIT, MITF, PDGFRA, POLD1, and POLE (sequencing only); EPCAM and GREM1 (deletion/duplication only).  ? ?Ms. Crace wished to pursue updated genetic testing and understands that the availability of the $250 self-pay price offered by [employer].  ? ?PLAN: After considering the risks, benefits, and limitations, Ms. Braxton provided informed consent to pursue genetic testing and the blood sample was sent to [facility] for analysis of the CancerNext-Expanded +RNAinsight Panel. Results should be available within approximately 3 weeks' time,  at which point they will be disclosed by telephone to Ms. Verno, as will any additional recommendations warranted by these results. Ms. Simoneau will receive a summary of her genetic counseling visit and a copy of her results once available. This information will also be available in Epic.  ? ?Lastly, we encouraged Ms. Alemany to remain in contact with cancer genetics annually so that we can continuously update the family history and inform her of any changes in cancer genetics and testing that may be of benefit for this family.  ? ?Ms. Putnam's questions were answered to her satisfaction today. Our contact information was provided should additional questions or concerns arise. Thank you for the referral and allowing [place] to share in the care of your patient.  ? ?Jaiven Graveline M.  Joette Catching, M[place], [place] ?Genetic Counselor ?[email or web address] ?(P) [phone] ? ?The patient was seen for a total of 30 minutes in face-to-face genetic counseling.  The patient was seen alone.  Drs.

## 2022-02-24 NOTE — Progress Notes (Signed)
? ? ? ?Chief Complaint:  ? ?OBESITY ?Jeanne Velez is here to discuss her progress with her obesity treatment plan along with follow-up of her obesity related diagnoses. Jeanne Velez is on the Category 2 Plan and keeping a food journal and adhering to recommended goals of 400-500 calories and 35 grams of protein at supper and states she is following her eating plan approximately 65% of the time. Jeanne Velez states she is walking/doing yard work for 35 minutes 3 times per week. ? ?Today's visit was #: 14 ?Starting weight: 220 lbs ?Starting date: 03/30/2021 ?Today's weight: 209 lbs ?Today's date: 02/16/2022 ?Total lbs lost to date: 11 lbs ?Total lbs lost since last in-office visit: 2 lbs ? ?Interim History:  ?Jeanne Velez says that for Easter, she celebrated with her [place] farm. ?She enjoyed a crock pot dish of: Ham/green beans/baby potatoes. ?Reviewed Bioimpedance reviewed with pt: ?Muscle Mass 115lbs , Fat Mass 87lbs,  ?MM>FM, 115lbs >87lbs. ? ?Subjective:  ? ?1. Vitamin D deficiency ?She is currently taking prescription ergocalciferol 50,000 IU each week. She denies nausea, vomiting or muscle weakness. ?On 12/13/2021, vitamin D level - 53.8 - stable. ? ?Assessment/Plan:  ? ?1. Vitamin D deficiency ?Refill ergocalciferol 50,000 IU once weekly. ? ?- Refill Vitamin D, Ergocalciferol, (DRISDOL) 1.25 MG (50000 UNIT) CAPS capsule; Take 1 capsule (50,000 Units total) by mouth every 7 (seven) days.  Dispense: 4 capsule; Refill: 0 ? ?2. Obesity with current BMI 31.8 ? ?Jeanne Velez is currently in the action stage of change. As such, her goal is to continue with weight loss efforts. She has agreed to the Category 2 Plan and keeping a food journal and adhering to recommended goals of 400-500 calories and 35 grams of protein at supper.  ? ?Exercise goals:  As is. ? ?Behavioral modification strategies: increasing lean protein intake, decreasing simple carbohydrates, meal planning and cooking strategies, keeping healthy foods in the home, planning for success,  and keeping a strict food journal. ? ?Jeanne Velez has agreed to follow-up with our clinic in 4 weeks. She was informed of the importance of frequent follow-up visits to maximize her success with intensive lifestyle modifications for her multiple health conditions.  ? ?Objective:  ? ?Blood pressure 117/75, pulse 78, temperature 98.3 ?F (36.8 ?C), height '5\' 8"'$  (1.727 m), weight 209 lb (94.8 kg), SpO2 100 %. ?Body mass index is 31.78 kg/m?. ? ?General: Cooperative, alert, well developed, in no acute distress. ?HEENT: Conjunctivae and lids unremarkable. ?Cardiovascular: Regular rhythm.  ?Lungs: Normal work of breathing. ?Neurologic: No focal deficits.  ? ?Lab Results  ?Component Value Date  ? CREATININE 0.74 07/22/2021  ? BUN 17 07/22/2021  ? NA 141 07/22/2021  ? K 4.4 07/22/2021  ? CL 101 07/22/2021  ? CO2 25 07/22/2021  ? ?Lab Results  ?Component Value Date  ? ALT 15 07/22/2021  ? AST 18 07/22/2021  ? ALKPHOS 97 07/22/2021  ? BILITOT 0.8 07/22/2021  ? ?Lab Results  ?Component Value Date  ? HGBA1C 5.6 12/13/2021  ? HGBA1C 5.7 (H) 07/22/2021  ? HGBA1C 5.7 (H) 03/30/2021  ? HGBA1C 5.6 08/11/2020  ? ?Lab Results  ?Component Value Date  ? INSULIN 4.7 07/22/2021  ? INSULIN 6.7 03/30/2021  ? ?Lab Results  ?Component Value Date  ? TSH 0.954 03/30/2021  ? ?Lab Results  ?Component Value Date  ? CHOL 181 07/22/2021  ? HDL 42 07/22/2021  ? LDLCALC 108 (H) 07/22/2021  ? TRIG 175 (H) 07/22/2021  ? CHOLHDL 4.3 07/22/2021  ? ?Lab Results  ?Component  Value Date  ? VD25OH 53.8 12/13/2021  ? VD25OH 57.1 07/22/2021  ? VD25OH 23.6 (L) 03/30/2021  ? ?Lab Results  ?Component Value Date  ? WBC 6.9 03/30/2021  ? HGB 13.8 03/30/2021  ? HCT 40.9 03/30/2021  ? MCV 91 03/30/2021  ? PLT 247 10/10/2020  ? ?Attestation Statements:  ? ?Reviewed by clinician on day of visit: allergies, medications, problem list, medical history, surgical history, family history, social history, and previous encounter notes. ? ?I, Water quality scientist, CMA, am acting as  Location manager for Mina Marble, NP. ? ?I have reviewed the above documentation for accuracy and completeness, and I agree with the above. -  Marlow Berenguer d. Lynae Pederson, NP-C ?

## 2022-03-09 ENCOUNTER — Ambulatory Visit: Payer: 59 | Admitting: Clinical

## 2022-03-11 ENCOUNTER — Telehealth: Payer: Self-pay | Admitting: Genetic Counselor

## 2022-03-11 ENCOUNTER — Ambulatory Visit: Payer: 59 | Admitting: Family Medicine

## 2022-03-11 ENCOUNTER — Encounter: Payer: Self-pay | Admitting: Family Medicine

## 2022-03-11 VITALS — BP 124/70 | HR 101 | Ht 68.0 in | Wt 214.4 lb

## 2022-03-11 DIAGNOSIS — M159 Polyosteoarthritis, unspecified: Secondary | ICD-10-CM

## 2022-03-11 DIAGNOSIS — M62830 Muscle spasm of back: Secondary | ICD-10-CM | POA: Diagnosis not present

## 2022-03-11 DIAGNOSIS — M542 Cervicalgia: Secondary | ICD-10-CM | POA: Diagnosis not present

## 2022-03-11 DIAGNOSIS — M25562 Pain in left knee: Secondary | ICD-10-CM | POA: Diagnosis not present

## 2022-03-11 DIAGNOSIS — M150 Primary generalized (osteo)arthritis: Secondary | ICD-10-CM

## 2022-03-11 DIAGNOSIS — S46912A Strain of unspecified muscle, fascia and tendon at shoulder and upper arm level, left arm, initial encounter: Secondary | ICD-10-CM

## 2022-03-11 DIAGNOSIS — G8929 Other chronic pain: Secondary | ICD-10-CM

## 2022-03-11 MED ORDER — BACLOFEN 10 MG PO TABS: 5.0000 mg | ORAL_TABLET | Freq: Three times a day (TID) | ORAL | 2 refills | Status: DC | PRN

## 2022-03-11 NOTE — Telephone Encounter (Signed)
Revealed negative genetic testing.  Discussed that more information is needed regarding pathology of thyroid cancer and colon polyps to determine if there is a clinical diagnosis of Cowden syndrome.  Patient provided names of providers related to previous GI screening and thyroid cancer treatment in order to request records.  Discussed that I plan to follow up her with in about 1 week.  ? ? ?

## 2022-03-11 NOTE — Progress Notes (Signed)
? ?Subjective:  ? ? Patient ID: Jeanne Velez, female    DOB: June 10, 1961, 61 y.o.   MRN: [id number] ? ?Jeanne Velez is a 61 y.o. female presenting on 03/11/2022 for Neck Pain ? ? ?HPI ? ?Chronic Neck Pain ?Trapezius / Levator Scapula/Subscapular muscle spasm ?Reports symptoms localized to Left mid neck posterior pain. >6 months onset, Seems to have not resolved, onset 07/2021, pain does not go away but it does radiate into upper back shoulder blades. ?Stressful job, w Electronics engineer and projects ? ?Painful Knuckle on Finger, chronic episodic flares ?Original onset Spring 2020 ?Recurrent flares, feels like arthritis nodule on knuckle, can increase in swelling. ? ?Chronic Knee Pain, Left ?Episodic flares. No known injury. She describes episodic flares lasting 2-3 days with joint flare with pain without obvious swelling or redness. ? ?She may take OTC options if needed. Ibuprofen 233m x 2 = 4057m every 6 hours PRN. Other option can take Tylenol Extra Str 50072m 2 = 1000m72mery 6 hours PRN. She may alternate taking one every 4 hours. ? ?Notable relief while taking the OTC medications. Does get some side effect constipation. ? ?She describes inflammatory response to sugars. She has improved diet. ? ?previously had autoimmune rheumatologic testing ?mother dx with myasthenia gravis, and daughter has some autoimmune uncertain ?History also has retinal vasculitis ? ?[facility] - Dr BeasLeafy RotyMina Marble ? ?  03/11/2022  ? 10:36 AM 09/16/2021  ?  8:30 AM 03/30/2021  ?  9:18 AM  ?Depression screen PHQ 2/9  ?Decreased Interest 0 0 1  ?Down, Depressed, Hopeless 0 0 0  ?PHQ - 2 Score 0 0 1  ?Altered sleeping 0 1 0  ?Tired, decreased energy 0 1 0  ?Change in appetite 0 1 3  ?Feeling bad or failure about yourself  0 0 0  ?Trouble concentrating 0 0 0  ?Moving slowly or fidgety/restless 0 0 0  ?Suicidal thoughts 0 0 0  ?PHQ-9 Score 0 3 4  ?Difficult doing work/chores Not difficult at all Not difficult  at all Not difficult at all  ? ? ?Past Medical History:  ?Diagnosis Date  ? Acne   ? Allergy   ? Anemia   ? Back pain   ? BRCA negative 07/2020  ? MyRisk neg  ? Cataracts, bilateral   ? Colon polyp   ? Constipation   ? Family history of breast cancer 07/2020  ? IBIS=15.1%/riskscore=6.7%  ? Family history of skin cancer 02/24/2022  ? Folliculitis   ? GERD (gastroesophageal reflux disease)   ? Hyperlipidemia   ? Hypothyroidism   ? Joint pain   ? Macular degeneration   ? Osteoarthritis   ? Retinal vasculitis   ? Stress   ? Thyroid cancer (HCC)[place]? ?Past Surgical History:  ?Procedure Laterality Date  ? BREAST BIOPSY Right   ? yrs ago benign, no marker  ? FLEXIBLE SIGMOIDOSCOPY N/A 04/28/2021  ? Procedure: FLEXIBLE SIGMOIDOSCOPY;  Surgeon: VangLin Landsman;  [facility];  Service: Gastroenterology;  Laterality: N/A;  ? ovarial cystectomy    ? THYROIDECTOMY    ? TOTAL ABDOMINAL HYSTERECTOMY W/ BILATERAL SALPINGOOPHORECTOMY    ? dermoids, ovar cysts  ? TUBAL LIGATION    ? ?Social History  ? ?Socioeconomic History  ? Marital status: Married  ?  Spouse name: Not on file  ? Number of children: Not on file  ? Years of education: Not on file  ? Highest  education level: Not on file  ?Occupational History  ? Occupation: Recreational therapist  ?Tobacco Use  ? Smoking status: Never  ? Smokeless tobacco: Never  ?Vaping Use  ? Vaping Use: Never used  ?Substance and Sexual Activity  ? Alcohol use: Yes  ? Drug use: Never  ? Sexual activity: Yes  ?  Birth control/protection: Surgical  ?  Comment: Hysterectomy  ?Other Topics Concern  ? Not on file  ?Social History Narrative  ? Not on file  ? ?Social Determinants of Health  ? ?Financial Resource Strain: Not on file  ?Food Insecurity: Not on file  ?Transportation Needs: Not on file  ?Physical Activity: Not on file  ?Stress: Not on file  ?Social Connections: Not on file  ?Intimate Partner Violence: Not on file  ? ?Family History  ?Problem Relation Age of Onset  ?  Myasthenia gravis Mother   ? Heart disease Father   ? Depression Father   ? Skin cancer Father   ?     dx 10s; face/scalp  ? Anxiety disorder Father   ? Obesity Father   ? Bone cancer Maternal Aunt 60  ? Melanoma Maternal Aunt   ?     dx 72s; bottom of foot  ? Breast cancer Maternal Grandmother   ?     dx unknown age  ? Skin cancer Maternal Grandmother   ?     dx unknown age  ? Breast cancer Cousin   ?     dx 16s; paternal female cousin  ? Melanoma Cousin   ?     dx 61s; paternal female cousin  ? ?Current Outpatient Medications on File Prior to Visit  ?Medication Sig  ? acetaminophen (TYLENOL) 325 MG tablet Take 650 mg by mouth every 6 (six) hours as needed.  ? calcium-vitamin D (OSCAL WITH D) 500-200 MG-UNIT tablet Take 1 tablet by mouth.  ? clindamycin (CLINDAGEL) 1 % gel Apply 1 application topically daily.  ? doxycycline (PERIOSTAT) 20 MG tablet Take 1 tablet (20 mg total) by mouth 2 (two) times daily. For acne. Take with food  ? estradiol (ESTRACE) 0.5 MG tablet Take 1 tablet (0.5 mg total) by mouth daily.  ? ketoconazole (NIZORAL) 2 % cream Apply 1 application topically daily as needed for irritation.  ? levothyroxine (SYNTHROID) 125 MCG tablet Take 125 mcg by mouth daily before breakfast.  ? loratadine (CLARITIN) 10 MG tablet Take 10 mg by mouth daily.  ? Multiple Vitamins-Minerals (PRESERVISION AREDS 2 PO) Take 1 capsule by mouth 2 (two) times daily.   ? NON FORMULARY Hypochlorous spray on face and back 2x daily  ? Probiotic Product (PROBIOTIC-10 PO) Take by mouth.  ? tretinoin (RETIN-A) 0.025 % cream Apply topically at bedtime.  ? valACYclovir (VALTREX) 1000 MG tablet Take 1,000 mg by mouth 2 (two) times daily as needed.   ? Vitamin D, Ergocalciferol, (DRISDOL) 1.25 MG (50000 UNIT) CAPS capsule Take 1 capsule (50,000 Units total) by mouth every 7 (seven) days.  ? ?No current facility-administered medications on file prior to visit.  ? ? ?Review of Systems ?Per HPI unless specifically indicated above ? ? ?    ?Objective:  ?  ?BP 124/70   Pulse (!) 101   Ht '5\' 8"'  (1.727 m)   Wt 214 lb 6.4 oz (97.3 kg)   SpO2 100%   BMI 32.60 kg/m?   ?Wt Readings from Last 3 Encounters:  ?03/11/22 214 lb 6.4 oz (97.3 kg)  ?02/16/22 209 lb (94.8 kg)  ?  01/25/22 211 lb (95.7 kg)  ?  ?Physical Exam ?Vitals and nursing note reviewed.  ?Constitutional:   ?   General: She is not in acute distress. ?   Appearance: She is well-developed. She is not diaphoretic.  ?   Comments: Well-appearing, comfortable, cooperative  ?HENT:  ?   Head: Normocephalic and atraumatic.  ?Eyes:  ?   General:     ?   Right eye: No discharge.     ?   Left eye: No discharge.  ?   Conjunctiva/sclera: Conjunctivae normal.  ?Neck:  ?   Thyroid: No thyromegaly.  ?Cardiovascular:  ?   Rate and Rhythm: Normal rate and regular rhythm.  ?   Heart sounds: Normal heart sounds. No murmur heard. ?Pulmonary:  ?   Effort: Pulmonary effort is normal. No respiratory distress.  ?   Breath sounds: Normal breath sounds. No wheezing or rales.  ?Musculoskeletal:     ?   General: Normal range of motion.  ?   Cervical back: Normal range of motion and neck supple.  ?   Comments: Muscle spasm hypertonicity posterior neck cervical paraspinal muscles. L>R spasm. ?L>R Trapezius and sub scap muscles with hypertonicity ?Has intact active ROM  ?Lymphadenopathy:  ?   Cervical: No cervical adenopathy.  ?Skin: ?   General: Skin is warm and dry.  ?   Findings: No erythema or rash.  ?Neurological:  ?   Mental Status: She is alert and oriented to person, place, and time.  ?Psychiatric:     ?   Behavior: Behavior normal.  ?   Comments: Well groomed, good eye contact, normal speech and thoughts  ? ?Results for orders placed or performed in visit on 12/13/21  ?VITAMIN D 25 Hydroxy (Vit-D Deficiency, Fractures)  ?Result Value Ref Range  ? Vit D, 25-Hydroxy 53.8 30.0 - 100.0 ng/mL  ?Hemoglobin A1c  ?Result Value Ref Range  ? Hgb A1c MFr Bld 5.6 4.8 - 5.6 %  ? Est. average glucose Bld gHb Est-mCnc 114 mg/dL   ? ?   ?Assessment & Plan:  ? ?Problem List Items Addressed This Visit   ?None ?Visit Diagnoses   ? ? Muscle spasm of back    -  Primary  ? Relevant Medications  ? baclofen (LIORESAL) 10 MG tablet  ? Muscle strain of

## 2022-03-11 NOTE — Telephone Encounter (Signed)
Called Jeanne Velez to discuss negative genetics.  Unable to talk in depth.  Patient said she will call back later today to discuss in more detail.  ?

## 2022-03-11 NOTE — Patient Instructions (Addendum)
Thank you for coming to the office today. ? ?START anti inflammatory topical - OTC Voltaren (generic Diclofenac) topical 2-4 times a day as needed for pain swelling of affected joint for 1-2 weeks or longer. ? ?Request copy of prior Rheumatology labs and office visit ? ?Start taking Baclofen (Lioresal) '10mg'$  (muscle relaxant) - start with half (cut) to one whole pill at night as needed for next 1-3 nights (may make you drowsy, caution with driving) see how it affects you, then if tolerated increase to one pill 2 to 3 times a day or (every 8 hours as needed) ? ?Recommend physical therapy referral in future. ? ?Scapular Release myofascial - pressure point passive relaxation technique. ? ? ?Please schedule a Follow-up Appointment to: Return in about 6 weeks (around 04/22/2022) for 6 week follow-up MSK / arthritis. ? ?If you have any other questions or concerns, please feel free to call the office or send a message through [facility]. You may also schedule an earlier appointment if necessary. ? ?Additionally, you may be receiving a survey about your experience at our office within a few days to 1 week by e-mail or mail. We value your feedback. ? ?Nobie Putnam, DO ?[facility] ?

## 2022-03-16 ENCOUNTER — Telehealth: Payer: Self-pay | Admitting: Genetic Counselor

## 2022-03-16 ENCOUNTER — Encounter (INDEPENDENT_AMBULATORY_CARE_PROVIDER_SITE_OTHER): Payer: Self-pay | Admitting: Adult Health

## 2022-03-16 ENCOUNTER — Ambulatory Visit (INDEPENDENT_AMBULATORY_CARE_PROVIDER_SITE_OTHER): Payer: 59 | Admitting: Adult Health

## 2022-03-16 VITALS — BP 122/76 | HR 76 | Temp 97.7°F | Ht 68.0 in | Wt 210.0 lb

## 2022-03-16 DIAGNOSIS — E559 Vitamin D deficiency, unspecified: Secondary | ICD-10-CM | POA: Diagnosis not present

## 2022-03-16 DIAGNOSIS — Z6832 Body mass index (BMI) 32.0-32.9, adult: Secondary | ICD-10-CM

## 2022-03-16 DIAGNOSIS — E669 Obesity, unspecified: Secondary | ICD-10-CM | POA: Diagnosis not present

## 2022-03-16 DIAGNOSIS — Z9189 Other specified personal risk factors, not elsewhere classified: Secondary | ICD-10-CM

## 2022-03-16 MED ORDER — VITAMIN D (ERGOCALCIFEROL) 1.25 MG (50000 UNIT) PO CAPS: 50000.0000 [IU] | ORAL_CAPSULE | ORAL | 0 refills | Status: DC

## 2022-03-16 NOTE — Telephone Encounter (Signed)
Called Dr. Abbey Chatters office to request patient records from thyroid cancer approximately 15 years ago.  Unable to send pathology report.  Called Ms. Eastlick and she is unable to remember where she had her biopsy.  Will send Ms. Hebenstreit email to sign to obtain clinic notes from Dr. Abbey Chatters office.  ?

## 2022-03-21 ENCOUNTER — Other Ambulatory Visit: Payer: PRIVATE HEALTH INSURANCE

## 2022-03-27 NOTE — Progress Notes (Signed)
? ? ? ?Chief Complaint:  ? ?OBESITY ?Jeanne Velez is here to discuss her progress with her obesity treatment plan along with follow-up of her obesity related diagnoses. Jeanne Velez is on 1200-1400 calorie and 90 gram protein with 400-500 calorie and 35 gram protein at supper and states she is following her eating plan approximately 60% of the time. Jeanne Velez states she is walking and doing heavy gardening 4-5 times per week. ? ?Today's visit was #: 15 ?Starting weight: 220 ?Starting date: 03/30/2021 ?Today's weight: 209 ?Today's date: 03/16/2022 ?Total lbs lost to date: 11 ?Total lbs lost since last in-office visit: 0 ? ?Interim History:  ?Jeanne Velez traveled to [place] and focused on  oatmeal, salads, and protein bars.  Very limited healthy eating options. ?In [place] for 4 days and followed low fat eating plan. ? ?She is pleased to have essentially maintained her weight since last visit.  ? ?Subjective:  ?1. Vitamin D deficiency ?12/13/21 Vitamin D level 53.8- stable, yet below goal of 70. ?She is on Ergocalciferol and denies N/V/muscle weakness. ? ? ?2. At risk for osteoporosis ?Jeanne Velez is at higher risk of osteopenia and osteoporosis due to Vitamin D deficiency.   ? ? ?Assessment/Plan:  ? ?1. Vitamin D deficiency ?Continue of Ergocalciferol Vitamin D, refill is provided. ?- Vitamin D, Ergocalciferol, (DRISDOL) 1.25 MG (50000 UNIT) CAPS capsule; Take 1 capsule (50,000 Units total) by mouth every 7 (seven) days.  Dispense: 12 capsule; Refill: 0 ? ?2. At risk for osteoporosis ?At risk due to Vitamin D deficiency and obesity ? ?3. Obesity with current BMI 32.0 ? ? ?Jeanne Velez is currently in the action stage of change. As such, her goal is to continue with weight loss efforts. She has agreed to the Category 2 Plan.  ? ?Exercise goals: As is ? ?Behavioral modification strategies: increasing lean protein intake, decreasing simple carbohydrates, meal planning and cooking strategies, keeping healthy foods in the home, and planning for success. ? ?Jeanne Velez  has agreed to follow-up with our clinic in 4 weeks. She was informed of the importance of frequent follow-up visits to maximize her success with intensive lifestyle modifications for her multiple health conditions.  ? ?Objective:  ? ?Blood pressure 122/76, pulse 76, temperature 97.7 ?F (36.5 ?C), height '5\' 8"'$  (1.727 m), weight 210 lb (95.3 kg), SpO2 100 %. ?Body mass index is 31.93 kg/m?. ? ?General: Cooperative, alert, well developed, in no acute distress. ?HEENT: Conjunctivae and lids unremarkable. ?Cardiovascular: Regular rhythm.  ?Lungs: Normal work of breathing. ?Neurologic: No focal deficits.  ? ?Lab Results  ?Component Value Date  ? CREATININE 0.74 07/22/2021  ? BUN 17 07/22/2021  ? NA 141 07/22/2021  ? K 4.4 07/22/2021  ? CL 101 07/22/2021  ? CO2 25 07/22/2021  ? ?Lab Results  ?Component Value Date  ? ALT 15 07/22/2021  ? AST 18 07/22/2021  ? ALKPHOS 97 07/22/2021  ? BILITOT 0.8 07/22/2021  ? ?Lab Results  ?Component Value Date  ? HGBA1C 5.6 12/13/2021  ? HGBA1C 5.7 (H) 07/22/2021  ? HGBA1C 5.7 (H) 03/30/2021  ? HGBA1C 5.6 08/11/2020  ? ?Lab Results  ?Component Value Date  ? INSULIN 4.7 07/22/2021  ? INSULIN 6.7 03/30/2021  ? ?Lab Results  ?Component Value Date  ? TSH 0.954 03/30/2021  ? ?Lab Results  ?Component Value Date  ? CHOL 181 07/22/2021  ? HDL 42 07/22/2021  ? LDLCALC 108 (H) 07/22/2021  ? TRIG 175 (H) 07/22/2021  ? CHOLHDL 4.3 07/22/2021  ? ?Lab Results  ?Component Value Date  ?  VD25OH 53.8 12/13/2021  ? VD25OH 57.1 07/22/2021  ? VD25OH 23.6 (L) 03/30/2021  ? ?Lab Results  ?Component Value Date  ? WBC 6.9 03/30/2021  ? HGB 13.8 03/30/2021  ? HCT 40.9 03/30/2021  ? MCV 91 03/30/2021  ? PLT 247 10/10/2020  ? ?No results found for: IRON, TIBC, FERRITIN ? ? ?Attestation Statements:  ? ?Reviewed by clinician on day of visit: allergies, medications, problem list, medical history, surgical history, family history, social history, and previous encounter notes. ? ? ?I, Althea Charon, am acting as  Location manager for [employer]. ? ?I have reviewed the above documentation for accuracy and completeness, and I agree with the above. -  Demetria Lightsey d. Toben Acuna, NP-C ?

## 2022-03-31 ENCOUNTER — Other Ambulatory Visit: Payer: Self-pay | Admitting: Obstetrics and Gynecology

## 2022-03-31 ENCOUNTER — Ambulatory Visit
Admission: RE | Admit: 2022-03-31 | Discharge: 2022-03-31 | Disposition: A | Discharge status: Home / Self Care | Payer: PRIVATE HEALTH INSURANCE | Source: Ambulatory Visit | Attending: Obstetrics and Gynecology | Admitting: Obstetrics and Gynecology

## 2022-03-31 ENCOUNTER — Encounter: Payer: Self-pay | Admitting: Obstetrics and Gynecology

## 2022-03-31 ENCOUNTER — Other Ambulatory Visit: Payer: PRIVATE HEALTH INSURANCE

## 2022-03-31 DIAGNOSIS — R928 Other abnormal and inconclusive findings on diagnostic imaging of breast: Secondary | ICD-10-CM | POA: Insufficient documentation

## 2022-03-31 DIAGNOSIS — N6489 Other specified disorders of breast: Secondary | ICD-10-CM | POA: Diagnosis present

## 2022-04-06 ENCOUNTER — Telehealth: Payer: PRIVATE HEALTH INSURANCE | Admitting: Physician Assistant

## 2022-04-06 ENCOUNTER — Ambulatory Visit: Payer: Self-pay | Admitting: Genetic Counselor

## 2022-04-06 DIAGNOSIS — Z86010 Personal history of colonic polyps: Secondary | ICD-10-CM

## 2022-04-06 DIAGNOSIS — Z1379 Encounter for other screening for genetic and chromosomal anomalies: Secondary | ICD-10-CM

## 2022-04-06 DIAGNOSIS — L709 Acne, unspecified: Secondary | ICD-10-CM | POA: Diagnosis not present

## 2022-04-06 DIAGNOSIS — Z803 Family history of malignant neoplasm of breast: Secondary | ICD-10-CM

## 2022-04-06 DIAGNOSIS — Z85850 Personal history of malignant neoplasm of thyroid: Secondary | ICD-10-CM

## 2022-04-06 DIAGNOSIS — Z808 Family history of malignant neoplasm of other organs or systems: Secondary | ICD-10-CM

## 2022-04-06 MED ORDER — DOXYCYCLINE HYCLATE 100 MG PO TABS: 100.0000 mg | ORAL_TABLET | Freq: Two times a day (BID) | ORAL | 0 refills | Status: AC

## 2022-04-06 NOTE — Progress Notes (Signed)
I have spent 5 minutes in review of e-visit questionnaire, review and updating patient chart, medical decision making and response to patient.   Shannah Conteh Cody Taci Sterling, PA-C    

## 2022-04-06 NOTE — Telephone Encounter (Signed)
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  Does not meet clinical diagnosis criteria at this point based on available information.  Asked patient to keep in contact about PH/FH polyps and cancer.

## 2022-04-06 NOTE — Progress Notes (Signed)
We are sorry that you are experiencing this issue.  Here is how we plan to help!  Based on what you shared with me it looks like you have severe acne.  Acne is a disorder of the hair follicles and oil glands (sebaceous glands). The sebaceous glands secrete oils to keep the skin moist.  When the glands get clogged, it can lead to pimples or cysts.  These cysts may become infected and leave scars. Acne is very common and normally occurs at puberty.  Acne is also inherited.  Your personal care plan consists of the following recommendations:  I recommend that you use a daily cleanser  You might try an over the counter cleanser that has benzoyl peroxide.  I recommend that you start with a product that has 2.5% benzoyl peroxide.  Stronger concentrations have not been shown to be more effective.  I have also prescribed one of the following additional therapies:  Doxycycline an oral antibiotic 100 mg twice a day for 14 days for flare.   If excessive dryness or peeling occurs, reduce dose frequency or concentration of the topical scrubs.  If excessive stinging or burning occurs, remove the topical gel with mild soap and water and resume at a lower dose the next day.  Remember oral antibiotics and topical acne treatments may increase your sensitivity to the sun!  HOME CARE: Do not squeeze pimples because that can often lead to infections, worse acne, and scars. Use a moisturizer that contains retinoid or fruit acids that may inhibit the development of new acne lesions. Although there is not a clear link that foods can cause acne, doctors do believe that too many sweets predispose you to skin problems.  GET HELP RIGHT AWAY IF: If your acne gets worse or is not better within 10 days. If you become depressed. If you become pregnant, discontinue medications and call your OB/GYN.  MAKE SURE YOU: Understand these instructions. Will watch your condition. Will get help right away if you are not doing well  or get worse.  Thank you for choosing an e-visit.  Your e-visit answers were reviewed by a board certified advanced clinical practitioner to complete your personal care plan. Depending upon the condition, your plan could have included both over the counter or prescription medications.  Please review your pharmacy choice. Make sure the pharmacy is open so you can pick up prescription now. If there is a problem, you may contact your provider through [employer] and have the prescription routed to another pharmacy.  Your safety is important to [place]. If you have drug allergies check your prescription carefully.   For the next 24 hours you can use MyChart to ask questions about today's visit, request a non-urgent call back, or ask for a work or school excuse. You will get an email in the next two days asking about your experience. I hope that your e-visit has been valuable and will speed your recovery.

## 2022-04-12 DIAGNOSIS — Z1379 Encounter for other screening for genetic and chromosomal anomalies: Secondary | ICD-10-CM | POA: Insufficient documentation

## 2022-04-12 NOTE — Progress Notes (Signed)
HPI:   Jeanne Velez was previously seen in the [place] clinic due to a personal and family history of cancer and concerns regarding a hereditary predisposition to cancer. Please refer to our prior cancer genetics clinic note for more information regarding our discussion, assessment and recommendations, at the time. Jeanne Velez recent genetic test results were disclosed to her, as were recommendations warranted by these results. These results and recommendations are discussed in more detail below.  CANCER HISTORY:    In her 71s, Jeanne Velez was diagnosed with papillary thyroid cancer s/p thyroidectomy.  She reports a history of approximately 10 lifetime colon polyps.      FAMILY HISTORY:  We obtained a detailed, 4-generation family history.  Significant diagnoses are listed below:      Family History  Problem Relation Age of Onset   Skin cancer Father          dx 32s; face/scalp   Bone cancer Maternal Aunt 27   Melanoma Maternal Aunt          dx 88s; bottom of foot   Breast cancer Maternal Grandmother          dx unknown age   Skin cancer Maternal Grandmother          dx unknown age   Breast cancer Cousin          dx 92s; paternal female cousin   Melanoma Cousin          dx 57s; paternal female cousin      Jeanne Velez is unaware of previous family history of genetic testing for hereditary cancer risks. There is no reported Ashkenazi Jewish ancestry. There is no known consanguinity.    GENETIC TEST RESULTS:  The [facility] CancerNext-Expanded +RNAinsight Panel found no pathogenic mutations. The CancerNext-Expanded gene panel offered by [facility] and includes sequencing, rearrangement, and RNA analysis for the following 77 genes: AIP, ALK, APC, ATM, AXIN2, BAP1, BARD1, BLM, BMPR1A, BRCA1, BRCA2, BRIP1, CDC73, CDH1, CDK4, CDKN1B, CDKN2A, CHEK2, CTNNA1, DICER1, FANCC, FH, FLCN, GALNT12, KIF1B, LZTR1, MAX, MEN1, MET, MLH1, MSH2, MSH3, MSH6, MUTYH, NBN, NF1, NF2, NTHL1,  PALB2, PHOX2B, PMS2, POT1, PRKAR1A, PTCH1, PTEN, RAD51C, RAD51D, RB1, RECQL, RET, SDHA, SDHAF2, SDHB, SDHC, SDHD, SMAD4, SMARCA4, SMARCB1, SMARCE1, STK11, SUFU, TMEM127, TP53, TSC1, TSC2, VHL and XRCC2 (sequencing and deletion/duplication); EGFR, EGLN1, HOXB13, KIT, MITF, PDGFRA, POLD1, and POLE (sequencing only); EPCAM and GREM1 (deletion/duplication only).   The test report has been scanned into EPIC and is located under the Molecular Pathology section of the Results Review tab.  A portion of the result report is included below for reference. Genetic testing reported out on March 07, 2022.     Even though a pathogenic variant was not identified, possible explanations for the cancer in the family may include: There may be no hereditary risk for cancer in the family. The cancers in Jeanne Velez and/or her family may be sporadic/familial or due to other genetic and environmental factors. There may be a gene mutation in one of these genes that current testing methods cannot detect but that chance is small. There could be another gene that has not yet been discovered, or that we have not yet tested, that is responsible for the cancer diagnoses in the family.  It is also possible there is a hereditary cause for the cancer in the family that Jeanne Velez did not inherit.  Therefore, it is important to remain in touch with cancer genetics in the future so that  we can continue to offer Jeanne Velez the most up to date genetic testing.   ADDITIONAL GENETIC TESTING:  We discussed with Jeanne Velez that her genetic testing was fairly extensive.  If there are additional relevant genes identified to increase cancer risk that can be analyzed in the future, we would be happy to discuss and coordinate this testing at that time.     CANCER SCREENING RECOMMENDATIONS:  Jeanne Velez test result is considered negative (normal).  This means that we have not identified a hereditary cause for her personal history of cancer  at this time.   An individual's cancer risk and medical management are not determined by genetic test results alone. Overall cancer risk assessment incorporates additional factors, including personal medical history, family history, and any available genetic information that may result in a personalized plan for cancer prevention and surveillance. Therefore, it is recommended she continue to follow the cancer management and screening guidelines provided by her  primary healthcare provider.  RECOMMENDATIONS FOR FAMILY MEMBERS:   Since she did not inherit a identifiable mutation in a cancer predisposition gene included on this panel, her children could not have inherited a known mutation from her in one of these genes. Individuals in this family might be at some increased risk of developing cancer, over the general population risk, due to the family history of cancer.  Individuals in the family should notify their providers of the family history of cancer. We recommend women in this family have a yearly mammogram beginning at age 15, or 70 years younger than the earliest onset of cancer, an annual clinical breast exam, and perform monthly breast self-exams.  Family members should have colonoscopies by at age 48, or earlier, as recommended by their providers.  Family members should speak with their providers about regular skin cancer screenings.    FOLLOW-UP:  Lastly, we discussed with Jeanne Velez that cancer genetics is a rapidly advancing field and it is possible that new genetic tests will be appropriate for her and/or her family members in the future. We encouraged her to remain in contact with cancer genetics on an annual basis so we can update her personal and family histories and let her know of advances in cancer genetics that may benefit this family.   Our contact number was provided. Jeanne Velez questions were answered to her satisfaction, and she knows she is welcome to call us at anytime with  additional questions or concerns.   Khadejah Son M. Joette Catching, [place], [facility] Genetic Counselor [email or web address] (P) [phone]

## 2022-04-14 ENCOUNTER — Ambulatory Visit (INDEPENDENT_AMBULATORY_CARE_PROVIDER_SITE_OTHER): Payer: 59 | Admitting: Adult Health

## 2022-04-22 ENCOUNTER — Encounter: Payer: Self-pay | Admitting: Family Medicine

## 2022-04-22 ENCOUNTER — Ambulatory Visit: Payer: 59 | Admitting: Family Medicine

## 2022-04-22 VITALS — BP 120/70 | HR 90 | Ht 68.0 in | Wt 214.0 lb

## 2022-04-22 DIAGNOSIS — M542 Cervicalgia: Secondary | ICD-10-CM | POA: Diagnosis not present

## 2022-04-22 DIAGNOSIS — M62830 Muscle spasm of back: Secondary | ICD-10-CM

## 2022-04-22 DIAGNOSIS — G8929 Other chronic pain: Secondary | ICD-10-CM | POA: Diagnosis not present

## 2022-04-22 DIAGNOSIS — M159 Polyosteoarthritis, unspecified: Secondary | ICD-10-CM

## 2022-04-22 NOTE — Patient Instructions (Addendum)
Thank you for coming to the office today.  Keep up the great work  Letter printed signed for massage therapy.  Next year can do blood after we talk.  Notify me if need anything else sooner!  Please schedule a Follow-up Appointment to: Return in about 1 year (around 04/23/2023) for 1 year Annual Physical AM apt fasting lab AFTER.  If you have any other questions or concerns, please feel free to call the office or send a message through [place]. You may also schedule an earlier appointment if necessary.  Additionally, you may be receiving a survey about your experience at our office within a few days to 1 week by e-mail or mail. We value your feedback.  Nobie Putnam, DO [place]

## 2022-04-22 NOTE — Progress Notes (Signed)
Subjective:    Patient ID: Jeanne Velez, female    DOB: November 12, 1961, 61 y.o.   MRN: [id number]  Jeanne Velez is a 61 y.o. female presenting on 04/22/2022 for Spasms and Arthritis   HPI  Chronic Neck Pain Trapezius / Levator Scapula/Subscapular muscle spasm  - Last visit with me 03/11/22 about 6 weeks ago, for initial visit for establish care and same problem, treated with rx baclofen but she declined and did not pursue the medication, see prior notes for background information. - Interval update with dramatic improvement in her symptoms. - Today patient reports doing well about 95% or more improved with regards to pain  Followed by [place] Therapist at [facility], will need medical necessity  Records of autoimmune testing, should be scanned in  She will need Letter of medical necessity for massage / therapist. To use HSA funds.    Followed by [facility] - Dr Leafy Ro, [place] Maintenance:  History of Thyroid Cancer age 89s, papillary. 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 Not able to definitively diagnose it.  Fam history of Melanoma. Now will follow closely with Dr Laurence Ferrari at [facility] for surveillance.       03/11/2022   10:36 AM 09/16/2021    8:30 AM 03/30/2021    9:18 AM  Depression screen PHQ 2/9  Decreased Interest 0 0 1  Down, Depressed, Hopeless 0 0 0  PHQ - 2 Score 0 0 1  Altered sleeping 0 1 0  Tired, decreased energy 0 1 0  Change in appetite 0 1 3  Feeling bad or failure about yourself  0 0 0  Trouble concentrating 0 0 0  Moving slowly or fidgety/restless 0 0 0  Suicidal thoughts 0 0 0  PHQ-9 Score 0 3 4  Difficult doing work/chores Not difficult at all Not difficult at all Not difficult at all    Social History   Tobacco Use   Smoking status: Never   Smokeless tobacco: Never  Vaping Use   Vaping Use: Never used  Substance Use Topics   Alcohol use: Yes   Drug use: Never    Review  of Systems Per HPI unless specifically indicated above     Objective:    BP 120/70   Pulse 90   Ht '5\' 8"'$  (1.727 m)   Wt 214 lb (97.1 kg)   SpO2 99%   BMI 32.54 kg/m   Wt Readings from Last 3 Encounters:  04/22/22 214 lb (97.1 kg)  03/16/22 210 lb (95.3 kg)  03/11/22 214 lb 6.4 oz (97.3 kg)    Physical Exam Vitals and nursing note reviewed.  Constitutional:      General: She is not in acute distress.    Appearance: Normal appearance. She is well-developed. She is not diaphoretic.     Comments: Well-appearing, comfortable, cooperative  HENT:     Head: Normocephalic and atraumatic.  Eyes:     General:        Right eye: No discharge.        Left eye: No discharge.     Conjunctiva/sclera: Conjunctivae normal.  Cardiovascular:     Rate and Rhythm: Normal rate.  Pulmonary:     Effort: Pulmonary effort is normal.  Skin:    General: Skin is warm and dry.     Findings: No erythema or rash.  Neurological:     Mental Status: She is alert and oriented to person,  place, and time.  Psychiatric:        Mood and Affect: Mood normal.        Behavior: Behavior normal.        Thought Content: Thought content normal.     Comments: Well groomed, good eye contact, normal speech and thoughts    Results for orders placed or performed in visit on 12/13/21  VITAMIN D 25 Hydroxy (Vit-D Deficiency, Fractures)  Result Value Ref Range   Vit D, 25-Hydroxy 53.8 30.0 - 100.0 ng/mL  Hemoglobin A1c  Result Value Ref Range   Hgb A1c MFr Bld 5.6 4.8 - 5.6 %   Est. average glucose Bld gHb Est-mCnc 114 mg/dL      Assessment & Plan:   Problem List Items Addressed This Visit   None Visit Diagnoses     Muscle spasm of back    -  Primary   Chronic neck pain       Primary osteoarthritis involving multiple joints           Significantly improved w/ Massage Therapy Reviewed benefits of therapy and medical necessity today Letter written for her HSA for massage therapy, signed today  DC  Baclofen, okay to avoid using muscle relaxant.  WIll review scanned chart review from lab work previously.   No orders of the defined types were placed in this encounter.     Follow up plan: Return in about 1 year (around 04/23/2023) for 1 year Annual Physical AM apt fasting lab AFTER.   Nobie Putnam, [facility] 04/22/2022, 2:09 PM

## 2022-05-12 ENCOUNTER — Ambulatory Visit: Payer: 59 | Admitting: Dermatology

## 2022-05-12 DIAGNOSIS — L723 Sebaceous cyst: Secondary | ICD-10-CM

## 2022-05-12 DIAGNOSIS — D2362 Other benign neoplasm of skin of left upper limb, including shoulder: Secondary | ICD-10-CM

## 2022-05-12 DIAGNOSIS — B078 Other viral warts: Secondary | ICD-10-CM

## 2022-05-12 DIAGNOSIS — L720 Epidermal cyst: Secondary | ICD-10-CM | POA: Diagnosis not present

## 2022-05-12 DIAGNOSIS — L700 Acne vulgaris: Secondary | ICD-10-CM

## 2022-05-12 DIAGNOSIS — L709 Acne, unspecified: Secondary | ICD-10-CM | POA: Diagnosis not present

## 2022-05-12 DIAGNOSIS — L820 Inflamed seborrheic keratosis: Secondary | ICD-10-CM

## 2022-05-12 MED ORDER — CLINDAMYCIN PHOSPHATE 1 % EX GEL: 1.0000 | Freq: Every day | CUTANEOUS | 5 refills | Status: DC

## 2022-05-12 MED ORDER — WINLEVI 1 % EX CREA: TOPICAL_CREAM | CUTANEOUS | 2 refills | Status: DC

## 2022-05-12 NOTE — Progress Notes (Signed)
Follow-Up Visit   Subjective  Jeanne Velez is a 61 y.o. female who presents for the following: Warts (Patient here today for what she thinks may be a wart at her left thigh. She also has been having an acne flare, did an e-visit and was prescribed doxycycline 100 mg twice daily for 2 weeks then decreased to 20 mg twice daily. She is also using hypochlorous spray daily, clindamycin in the morning and tretinoin 0.025% at bedtime. ).  Patient with 3 moles that are growing or are getting irritated that she would like removed at the face and left arm.   Patient switched to Elta MD sunscreen and wants to make sure it's ok she is using it with moisturizer.   The following portions of the chart were reviewed this encounter and updated as appropriate:   Tobacco  Allergies  Meds  Problems  Med Hx  Surg Hx  Fam Hx      Review of Systems:  No other skin or systemic complaints except as noted in HPI or Assessment and Plan.  Objective  Well appearing patient in no apparent distress; mood and affect are within normal limits.  A focused examination was performed including face, left arm, hands, left thigh, chest, and back. Relevant physical exam findings are noted in the Assessment and Plan.  face Scattered inflammatory papules. Few cysts  left thigh, left arm Verrucous papules -- Discussed viral etiology and contagion.   Left Upper Arm Firm pink/brown papulenodule with dimple sign.   right preauricular Erythematous stuck-on, waxy papule or plaque  right lower cheek, left upper lip Firm erythematous papules    Assessment & Plan  Acne vulgaris face  Chronic and persistent condition with duration or expected duration over one year. Condition is bothersome/symptomatic for patient. Currently flared.  Discussed adding spironolactone again vs Winlevi. Pt prefers to try topical first.  Start Winlevi twice daily. Continue hypochlorous spray once daily. Continue clindamycin followed  by Nigel Bridgeman in the morning Continue tretinoin 0.025% cream after applying Winlevi at bedtime. Continue doxycycline 20 mg twice daily with food.  Doxycycline should be taken with food to prevent nausea. Do not lay down for 30 minutes after taking. Be cautious with sun exposure and use good sun protection while on this medication. Pregnant women should not take this medication.   Topical retinoid medications like tretinoin/Retin-A, adapalene/Differin, tazarotene/Fabior, and Epiduo/Epiduo Forte can cause dryness and irritation when first started. Only apply a pea-sized amount to the entire affected area. Avoid applying it around the eyes, edges of mouth and creases at the nose. If you experience irritation, use a good moisturizer first and/or apply the medicine less often. If you are doing well with the medicine, you can increase how often you use it until you are applying every night. Be careful with sun protection while using this medication as it can make you sensitive to the sun. This medicine should not be used by pregnant women.      Clascoterone (WINLEVI) 1 % CREA - face Apply twice daily as directed  Related Medications tretinoin (RETIN-A) 0.025 % cream Apply topically at bedtime.  Other viral warts left thigh, left arm  Start prescription 5-fluorouracil/salicylic acid wart paste from [facility] nightly under occlusion to warts. Reviewed risk of irritation and risk scarring if applied to normal skin. If irritation develops, stop medication for a few days until area calm, then restart a very small amount just to the wart. This medication cannot be used by pregnant women. Patient  advised they will receive an email from the [facility] and can purchase the medication online through a link in the email.  LN2, squaric acid, and LN2 to left thigh  Squaric Acid 3% applied to wart at left thigh today. Prior to application reviewed risk of inflammation and  irritation.  Discussed viral etiology and risk of spread.  Discussed multiple treatments may be required to clear warts.  Discussed possible post-treatment dyspigmentation and risk of recurrence.  Prior to procedure, discussed risks of blister formation, small wound, skin dyspigmentation, or rare scar following cryotherapy. Recommend Vaseline ointment to treated areas while healing.   Destruction of lesion - left thigh, left arm  Destruction method: cryotherapy   Informed consent: discussed and consent obtained   Lesion destroyed using liquid nitrogen: Yes   Cryotherapy cycles:  2 Outcome: patient tolerated procedure well with no complications   Post-procedure details: wound care instructions given    Destruction of lesion - left thigh, left arm  Destruction method: chemical removal   Informed consent: discussed and consent obtained   Timeout:  patient name, date of birth, surgical site, and procedure verified Chemical destruction method comment:  Cantharidin plus Application time:  4 hours Procedure instructions: patient instructed to wash and dry area   Outcome: patient tolerated procedure well with no complications   Post-procedure details: wound care instructions given   Additional details:  Patient advised to set alarm to remind them to wash off at left thigh with soap and water at the directed time.  Benign neoplasm of skin of left arm Left Upper Arm  C/w irritated dermatofibroma  Symptomatic, tender, gets nicked with shaving  Prior to procedure, discussed risks of blister formation, small wound, skin dyspigmentation, or rare scar following cryotherapy. Recommend Vaseline ointment to treated areas while healing.   Destruction of lesion - Left Upper Arm  Destruction method: cryotherapy   Informed consent: discussed and consent obtained   Lesion destroyed using liquid nitrogen: Yes   Cryotherapy cycles:  2 Outcome: patient tolerated procedure well with no complications    Post-procedure details: wound care instructions given    Inflamed seborrheic keratosis right preauricular  Symptomatic, irritating, patient would like treated.  Prior to procedure, discussed risks of blister formation, small wound, skin dyspigmentation, or rare scar following cryotherapy. Recommend Vaseline ointment to treated areas while healing.   Destruction of lesion - right preauricular  Destruction method: cryotherapy   Informed consent: discussed and consent obtained   Lesion destroyed using liquid nitrogen: Yes   Cryotherapy cycles:  2 Outcome: patient tolerated procedure well with no complications   Post-procedure details: wound care instructions given    Inflamed epidermoid cyst of skin right lower cheek, left upper lip  Intralesional steroid injection side effects were reviewed including thinning of the skin and discoloration, such as redness, lightening or darkening.  [place] [phone]   Intralesional injection - right lower cheek, left upper lip Location: left upper lip, right lower cheek  Informed Consent: Discussed risks (infection, pain, bleeding, bruising, thinning of the skin, loss of skin pigment, lack of resolution, and recurrence of lesion) and benefits of the procedure, as well as the alternatives. Informed consent was obtained. Preparation: The area was prepared a standard fashion.  Procedure Details: An intralesional injection was performed with Kenalog 1.25 mg/cc. 0.1 cc in total were injected.  Total number of injections: 2  Plan: The patient was instructed on post-op care. Recommend OTC analgesia as needed for pain.   Acne, unspecified acne  type  Related Medications clindamycin (CLINDAGEL) 1 % gel Apply 1 Application topically daily.   Return in about 4 weeks (around 06/09/2022) for acne, Warts.  Graciella Belton, RMA, am acting as scribe for Forest Gleason, MD .  Documentation: I have reviewed the above documentation for accuracy and  completeness, and I agree with the above.  Forest Gleason, MD

## 2022-05-12 NOTE — Patient Instructions (Addendum)
Start prescription 5-fluorouracil/salicylic acid wart paste from [facility] nightly under occlusion. Reviewed risk of irritation and risk scarring if applied to normal skin. If irritation develops, stop medication for a few days until area calm, then restart a very small amount just to the wart. This medication cannot be used by pregnant women. Patient advised they will receive an email from the [place] and can purchase the medication online through a link in the email.   Recommend brown paper tape in place of band-aids.   Start Winlevi twice daily. Continue hypochlorous spray once daily. Continue clindamycin followed by Nigel Bridgeman in the morning Continue tretinoin 0.025% cream after applying Winlevi at bedtime. Continue doxycycline 20 mg twice daily with food.  Doxycycline should be taken with food to prevent nausea. Do not lay down for 30 minutes after taking. Be cautious with sun exposure and use good sun protection while on this medication. Pregnant women should not take this medication.   Cryotherapy Aftercare  Wash gently with soap and water everyday.   Apply Vaseline and Band-Aid daily until healed.    Instructions for After In-Office Application of Cantharidin  1. This is a strong medicine; please follow ALL instructions.  2. Gently wash off with soap and water in four hours or sooner s directed by your physician.  3. **WARNING** this medicine can cause severe blistering, blood blisters, infection, and/or scarring if it is not washed off as directed.  4. Your progress will be rechecked in 1-2 months; call sooner if there are any questions or problems.  Cantharidin is a blistering agent that comes from a beetle.  It needs to be washed off in about 4 hours after application.  Although it is painless when applied in office, it may cause symptoms of mild pain and burning several hours later.  Treated areas will swell and turn red, and blisters may form.  Vaseline  and a bandaid may be applied until wound has healed.  Once healed, the skin may remain temporarily discolored.  It can take weeks to months for pigmentation to return to normal.  The molluscum may resolve with this topical treatment, but often, additional treatments may be required to clear molluscum.  It is recommended to keep the skin well-moisturized and avoid scratching affected area to help prevent spread of the molluscum.   Due to recent changes in healthcare laws, you may see results of your pathology and/or laboratory studies on MyChart before the doctors have had a chance to review them. We understand that in some cases there may be results that are confusing or concerning to you. Please understand that not all results are received at the same time and often the doctors may need to interpret multiple results in order to provide you with the best plan of care or course of treatment. Therefore, we ask that you please give [place] 2 business days to thoroughly review all your results before contacting the office for clarification. Should we see a critical lab result, you will be contacted sooner.   If You Need Anything After Your Visit  If you have any questions or concerns for your doctor, please call our main line at [phone] and press option 4 to reach your doctor's medical assistant. If no one answers, please leave a voicemail as directed and we will return your call as soon as possible. Messages left after 4 pm will be answered the following business day.   You may also send [place] a message via [place]. We typically  respond to MyChart messages within 1-2 business days.  For prescription refills, please ask your pharmacy to contact our office. Our fax number is [phone].  If you have an urgent issue when the clinic is closed that cannot wait until the next business day, you can page your doctor at the number below.    Please note that while we do our best to be available for urgent issues  outside of office hours, we are not available 24/7.   If you have an urgent issue and are unable to reach [place], you may choose to seek medical care at your doctor's office, retail clinic, urgent care center, or emergency room.  If you have a medical emergency, please immediately call 911 or go to the emergency department.  Pager Numbers  - Dr. Nehemiah Massed: [phone]  - Dr. Laurence Ferrari: [phone]  - Dr. Nicole Kindred: [phone]  In the event of inclement weather, please call our main line at [phone] for an update on the status of any delays or closures.  Dermatology Medication Tips: Please keep the boxes that topical medications come in in order to help keep track of the instructions about where and how to use these. Pharmacies typically print the medication instructions only on the boxes and not directly on the medication tubes.   If your medication is too expensive, please contact our office at [phone] option 4 or send [place] a message through [place].   We are unable to tell what your co-pay for medications will be in advance as this is different depending on your insurance coverage. However, we may be able to find a substitute medication at lower cost or fill out paperwork to get insurance to cover a needed medication.   If a prior authorization is required to get your medication covered by your insurance company, please allow [place] 1-2 business days to complete this process.  Drug prices often vary depending on where the prescription is filled and some pharmacies may offer cheaper prices.  The website www.goodrx.com contains coupons for medications through different pharmacies. The prices here do not account for what the cost may be with help from insurance (it may be cheaper with your insurance), but the website can give you the price if you did not use any insurance.  - You can print the associated coupon and take it with your prescription to the pharmacy.  - You may also stop by our  office during regular business hours and pick up a GoodRx coupon card.  - If you need your prescription sent electronically to a different pharmacy, notify our office through [facility] or by phone at [phone] option 4.     Si Usted Necesita Algo Despus de Su Visita  Tambin puede enviarnos un mensaje a travs de Pharmacist, community. Por lo general respondemos a los mensajes de MyChart en el transcurso de 1 a 2 das hbiles.  Para renovar recetas, por favor pida a su farmacia que se ponga en contacto con nuestra oficina. Harland Dingwall de fax es [place] [phone].  Si tiene un asunto urgente cuando la clnica est cerrada y que no puede esperar hasta el siguiente da hbil, puede llamar/localizar a su doctor(a) al nmero que aparece a continuacin.   Por favor, tenga en cuenta que aunque hacemos todo lo posible para estar disponibles para asuntos urgentes fuera del horario de [place], no estamos disponibles las 24 horas del da, los 7 das de la [place].   Si tiene un problema urgente y no puede comunicarse con  nosotros, puede optar por buscar atencin mdica  en el consultorio de su doctor(a), en una clnica privada, en un centro de atencin urgente o en una sala de emergencias.  Si tiene Engineering geologist, por favor llame inmediatamente al 911 o vaya a la sala de emergencias.  Nmeros de bper  - Dr. Nehemiah Massed: [phone]  - Dra. Moye: [phone]  - Dra. Nicole Kindred: [phone]  En caso de inclemencias del [place], por favor llame a Johnsie Kindred principal al [phone] para una actualizacin sobre el [place] de cualquier retraso o cierre.  Consejos para la medicacin en dermatologa: Por favor, guarde las cajas en las que vienen los medicamentos de uso tpico para ayudarle a seguir las instrucciones sobre dnde y cmo usarlos. Las farmacias generalmente imprimen las instrucciones del medicamento slo en las cajas y no directamente en los tubos del [place].   Si su  medicamento es muy caro, por favor, pngase en contacto con Zigmund Daniel llamando al [phone] y presione la opcin 4 o envenos un mensaje a travs de Pharmacist, community.   No podemos decirle cul ser su copago por los medicamentos por adelantado ya que esto es diferente dependiendo de la cobertura de su seguro. Sin embargo, es posible que podamos encontrar un medicamento sustituto a Electrical engineer un formulario para que el seguro cubra el medicamento que se considera necesario.   Si se requiere una autorizacin previa para que su compaa de seguros [place] su medicamento, por favor permtanos de 1 a 2 das hbiles para completar este proceso.  Los precios de los medicamentos varan con frecuencia dependiendo del Environmental consultant de dnde se surte la receta y alguna farmacias pueden ofrecer precios ms baratos.  El sitio web www.goodrx.com tiene cupones para medicamentos de Airline pilot. Los precios aqu no tienen en cuenta lo que podra costar con la ayuda del seguro (puede ser ms barato con su seguro), pero el sitio web puede darle el precio si no utiliz Research scientist (physical sciences).  - Puede imprimir el cupn correspondiente y llevarlo con su receta a la farmacia.  - Tambin puede pasar por nuestra oficina durante el horario de atencin regular y Charity fundraiser una tarjeta de cupones de GoodRx.  - Si necesita que su receta se enve electrnicamente a una farmacia diferente, informe a nuestra oficina a travs de MyChart de [place] o por telfono llamando al [phone] y presione la opcin 4.

## 2022-05-17 ENCOUNTER — Encounter: Payer: Self-pay | Admitting: Dermatology

## 2022-06-07 ENCOUNTER — Encounter: Payer: Self-pay | Admitting: Dermatology

## 2022-06-07 ENCOUNTER — Ambulatory Visit: Payer: PRIVATE HEALTH INSURANCE | Admitting: Dermatology

## 2022-06-07 DIAGNOSIS — D239 Other benign neoplasm of skin, unspecified: Secondary | ICD-10-CM

## 2022-06-07 DIAGNOSIS — L700 Acne vulgaris: Secondary | ICD-10-CM | POA: Diagnosis not present

## 2022-06-07 DIAGNOSIS — L820 Inflamed seborrheic keratosis: Secondary | ICD-10-CM | POA: Diagnosis not present

## 2022-06-07 DIAGNOSIS — D2362 Other benign neoplasm of skin of left upper limb, including shoulder: Secondary | ICD-10-CM

## 2022-06-07 DIAGNOSIS — Z79899 Other long term (current) drug therapy: Secondary | ICD-10-CM

## 2022-06-07 NOTE — Patient Instructions (Addendum)
Cryotherapy Aftercare  Wash gently with soap and water everyday.   Apply Vaseline daily until healed.    Stop Winlevi Continue Tretinoin, Clindamycin, hypochlorous acid spray and Doxycycline 20 mg 2 daily with food.  Plan to start Spironolactone 100 mg once daily pending normal lab results.  Recommend gentle cleanser: epionce lytic gel cleanser.  Topical retinoid medications like tretinoin/Retin-A, adapalene/Differin, tazarotene/Fabior, and Epiduo/Epiduo Forte can cause dryness and irritation when first started. Only apply a pea-sized amount to the entire affected area. Avoid applying it around the eyes, edges of mouth and creases at the nose. If you experience irritation, use a good moisturizer first and/or apply the medicine less often. If you are doing well with the medicine, you can increase how often you use it until you are applying every night. Be careful with sun protection while using this medication as it can make you sensitive to the sun. This medicine should not be used by pregnant women.   Doxycycline should be taken with food to prevent nausea. Do not lay down for 30 minutes after taking. Be cautious with sun exposure and use good sun protection while on this medication. Pregnant women should not take this medication.   Spironolactone can cause increased urination and cause blood pressure to decrease. Please watch for signs of lightheadedness and be cautious when changing position. It can sometimes cause breast tenderness or an irregular period in premenopausal women. It can also increase potassium. The increase in potassium usually is not a concern unless you are taking other medicines that also increase potassium, so please be sure your doctor knows all of the other medications you are taking. This medication should not be taken by pregnant women.  This medicine should also not be taken together with sulfa drugs like Bactrim (trimethoprim/sulfamethexazole).   Due to recent changes  in healthcare laws, you may see results of your pathology and/or laboratory studies on MyChart before the doctors have had a chance to review them. We understand that in some cases there may be results that are confusing or concerning to you. Please understand that not all results are received at the same time and often the doctors may need to interpret multiple results in order to provide you with the best plan of care or course of treatment. Therefore, we ask that you please give [place] 2 business days to thoroughly review all your results before contacting the office for clarification. Should we see a critical lab result, you will be contacted sooner.   If You Need Anything After Your Visit  If you have any questions or concerns for your doctor, please call our main line at [phone] and press option 4 to reach your doctor's medical assistant. If no one answers, please leave a voicemail as directed and we will return your call as soon as possible. Messages left after 4 pm will be answered the following business day.   You may also send [place] a message via [place]. We typically respond to MyChart messages within 1-2 business days.  For prescription refills, please ask your pharmacy to contact our office. Our fax number is [phone].  If you have an urgent issue when the clinic is closed that cannot wait until the next business day, you can page your doctor at the number below.    Please note that while we do our best to be available for urgent issues outside of office hours, we are not available 24/7.   If you have an urgent issue and are unable  to reach [place], you may choose to seek medical care at your doctor's office, retail clinic, urgent care center, or emergency room.  If you have a medical emergency, please immediately call 911 or go to the emergency department.  Pager Numbers  - Dr. Nehemiah Massed: [phone]  - Dr. Laurence Ferrari: [phone]  - Dr. Nicole Kindred: [phone]  In the event of  inclement weather, please call our main line at [phone] for an update on the status of any delays or closures.  Dermatology Medication Tips: Please keep the boxes that topical medications come in in order to help keep track of the instructions about where and how to use these. Pharmacies typically print the medication instructions only on the boxes and not directly on the medication tubes.   If your medication is too expensive, please contact our office at [phone] option 4 or send [place] a message through [place].   We are unable to tell what your co-pay for medications will be in advance as this is different depending on your insurance coverage. However, we may be able to find a substitute medication at lower cost or fill out paperwork to get insurance to cover a needed medication.   If a prior authorization is required to get your medication covered by your insurance company, please allow [place] 1-2 business days to complete this process.  Drug prices often vary depending on where the prescription is filled and some pharmacies may offer cheaper prices.  The website www.goodrx.com contains coupons for medications through different pharmacies. The prices here do not account for what the cost may be with help from insurance (it may be cheaper with your insurance), but the website can give you the price if you did not use any insurance.  - You can print the associated coupon and take it with your prescription to the pharmacy.  - You may also stop by our office during regular business hours and pick up a GoodRx coupon card.  - If you need your prescription sent electronically to a different pharmacy, notify our office through [facility] or by phone at [phone] option 4.     Si Usted Necesita Algo Despus de Su Visita  Tambin puede enviarnos un mensaje a travs de Pharmacist, community. Por lo general respondemos a los mensajes de MyChart en el transcurso de 1 a 2 das hbiles.  Para renovar  recetas, por favor pida a su farmacia que se ponga en contacto con nuestra oficina. Harland Dingwall de fax es [place] [phone].  Si tiene un asunto urgente cuando la clnica est cerrada y que no puede esperar hasta el siguiente da hbil, puede llamar/localizar a su doctor(a) al nmero que aparece a continuacin.   Por favor, tenga en cuenta que aunque hacemos todo lo posible para estar disponibles para asuntos urgentes fuera del horario de [place], no estamos disponibles las 24 horas del da, los 7 das de la [place].   Si tiene un problema urgente y no puede comunicarse con nosotros, puede optar por buscar atencin mdica  en el consultorio de su doctor(a), en una clnica privada, en un centro de atencin urgente o en una sala de emergencias.  Si tiene Engineering geologist, por favor llame inmediatamente al 911 o vaya a la sala de emergencias.  Nmeros de bper  - Dr. Nehemiah Massed: [phone]  - Dra. Moye: [phone]  - Dra. Nicole Kindred: [phone]  En caso de inclemencias del [place], por favor llame a nuestra lnea principal al [phone] para una actualizacin sobre el [place] de  cualquier retraso o cierre.  Consejos para la medicacin en dermatologa: Por favor, guarde las cajas en las que vienen los medicamentos de uso tpico para ayudarle a seguir las instrucciones sobre dnde y cmo usarlos. Las farmacias generalmente imprimen las instrucciones del medicamento slo en las cajas y no directamente en los tubos del [place].   Si su medicamento es muy caro, por favor, pngase en contacto con Zigmund Daniel llamando al [phone] y presione la opcin 4 o envenos un mensaje a travs de Pharmacist, community.   No podemos decirle cul ser su copago por los medicamentos por adelantado ya que esto es diferente dependiendo de la cobertura de su seguro. Sin embargo, es posible que podamos encontrar un medicamento sustituto a Electrical engineer un formulario para que el seguro cubra el medicamento  que se considera necesario.   Si se requiere una autorizacin previa para que su compaa de seguros [place] su medicamento, por favor permtanos de 1 a 2 das hbiles para completar este proceso.  Los precios de los medicamentos varan con frecuencia dependiendo del Environmental consultant de dnde se surte la receta y alguna farmacias pueden ofrecer precios ms baratos.  El sitio web www.goodrx.com tiene cupones para medicamentos de Airline pilot. Los precios aqu no tienen en cuenta lo que podra costar con la ayuda del seguro (puede ser ms barato con su seguro), pero el sitio web puede darle el precio si no utiliz Research scientist (physical sciences).  - Puede imprimir el cupn correspondiente y llevarlo con su receta a la farmacia.  - Tambin puede pasar por nuestra oficina durante el horario de atencin regular y Charity fundraiser una tarjeta de cupones de GoodRx.  - Si necesita que su receta se enve electrnicamente a una farmacia diferente, informe a nuestra oficina a travs de MyChart de [place] o por telfono llamando al [phone] y presione la opcin 4.

## 2022-06-07 NOTE — Progress Notes (Signed)
Follow-Up Visit   Subjective  Jeanne Velez is a 61 y.o. female who presents for the following: Acne (Face. 1 month recheck. Using Winlevi, hypochlorous spray, tretinoin 0.025% cream, clindamycin gel and taking Doxycycline 20 mg 2 daily. Patient reports she does not like Winlevi. Face feels "tacky/sticky" all day) and Seborrheic Keratosis (ISK's on right temple and left arm have not resolved since last visit. Tx with LN2 4 weeks ago).  The following portions of the chart were reviewed this encounter and updated as appropriate:  Tobacco  Allergies  Meds  Problems  Med Hx  Surg Hx  Fam Hx      Review of Systems: No other skin or systemic complaints except as noted in HPI or Assessment and Plan.   Objective  Well appearing patient in no apparent distress; mood and affect are within normal limits.  A focused examination was performed including face, left thigh, left arm. Relevant physical exam findings are noted in the Assessment and Plan.  face Trace open comedones scattered inflammatory papules and rare cysts  Left Upper Arm x1 Firm pink/brown papulenodule with dimple sign.   Right Temple x1 Erythematous keratotic or waxy stuck-on papule or plaque.   Assessment & Plan  Acne vulgaris face  Chronic and persistent condition with duration or expected duration over one year. Condition is symptomatic / bothersome to patient. Not to goal.   Stop Winlevi Continue Tretinoin, Clindamycin, hypochlorous acid spray and Doxycycline 20 mg 2 daily with food.  Plan to start Spironolactone 100 mg once daily pending normal labs.  BP: 127/80  Topical retinoid medications like tretinoin/Retin-A, adapalene/Differin, tazarotene/Fabior, and Epiduo/Epiduo Forte can cause dryness and irritation when first started. Only apply a pea-sized amount to the entire affected area. Avoid applying it around the eyes, edges of mouth and creases at the nose. If you experience irritation, use a good  moisturizer first and/or apply the medicine less often. If you are doing well with the medicine, you can increase how often you use it until you are applying every night. Be careful with sun protection while using this medication as it can make you sensitive to the sun. This medicine should not be used by pregnant women.   Doxycycline should be taken with food to prevent nausea. Do not lay down for 30 minutes after taking. Be cautious with sun exposure and use good sun protection while on this medication. Pregnant women should not take this medication.   Spironolactone can cause increased urination and cause blood pressure to decrease. Please watch for signs of lightheadedness and be cautious when changing position. It can sometimes cause breast tenderness or an irregular period in premenopausal women. It can also increase potassium. The increase in potassium usually is not a concern unless you are taking other medicines that also increase potassium, so please be sure your doctor knows all of the other medications you are taking. This medication should not be taken by pregnant women.  This medicine should also not be taken together with sulfa drugs like Bactrim (trimethoprim/sulfamethexazole).    Related Procedures CMP  Related Medications tretinoin (RETIN-A) 0.025 % cream Apply topically at bedtime.  Encounter for long-term current use of medication  Related Procedures CMP  Other benign neoplasm of skin, unspecified Left Upper Arm x1  Consistent with irritated dermatofibroma Symptomatic per patient. Inflamed on exam.   Destruction of lesion - Left Upper Arm x1  Destruction method: cryotherapy   Informed consent: discussed and consent obtained   Lesion destroyed using liquid  nitrogen: Yes   Cryotherapy cycles:  2 Outcome: patient tolerated procedure well with no complications   Post-procedure details: wound care instructions given   Additional details:  Prior to procedure, discussed  risks of blister formation, small wound, skin dyspigmentation, or rare scar following cryotherapy. Recommend Vaseline ointment to treated areas while healing.   Inflamed seborrheic keratosis Right Temple x1  Symptomatic, irritating, patient would like treated.  Destruction of lesion - Right Temple x1  Destruction method: cryotherapy   Informed consent: discussed and consent obtained   Lesion destroyed using liquid nitrogen: Yes   Outcome: patient tolerated procedure well with no complications   Post-procedure details: wound care instructions given   Additional details:  Prior to procedure, discussed risks of blister formation, small wound, skin dyspigmentation, or rare scar following cryotherapy. Recommend Vaseline ointment to treated areas while healing.    Return in about 3 months (around 09/07/2022) for Acne Follow Up, nurse visit for BP in 2 1/2 weeks.  I, Emelia Salisbury, CMA, am acting as scribe for Forest Gleason, MD.  Documentation: I have reviewed the above documentation for accuracy and completeness, and I agree with the above.  Forest Gleason, MD

## 2022-06-14 ENCOUNTER — Telehealth: Payer: Self-pay

## 2022-06-14 ENCOUNTER — Encounter: Payer: Self-pay | Admitting: Family Medicine

## 2022-06-14 ENCOUNTER — Ambulatory Visit: Payer: 59 | Admitting: Family Medicine

## 2022-06-14 VITALS — BP 129/82 | HR 90 | Ht 68.0 in | Wt 214.8 lb

## 2022-06-14 DIAGNOSIS — J0110 Acute frontal sinusitis, unspecified: Secondary | ICD-10-CM | POA: Diagnosis not present

## 2022-06-14 DIAGNOSIS — G9331 Postviral fatigue syndrome: Secondary | ICD-10-CM

## 2022-06-14 MED ORDER — DOXYCYCLINE HYCLATE 100 MG PO TABS: 100.0000 mg | ORAL_TABLET | Freq: Two times a day (BID) | ORAL | 0 refills | Status: DC

## 2022-06-14 NOTE — Telephone Encounter (Signed)
Patient would like to increase strength on Tretinoin Cream. Would you like her to keep current dose at this time?

## 2022-06-14 NOTE — Telephone Encounter (Signed)
That is fine.   Thank you

## 2022-06-14 NOTE — Progress Notes (Signed)
Subjective:    Patient ID: Jeanne Velez, female    DOB: 27-Jan-1961, 61 y.o.   MRN: [id number]  Jeanne Velez is a 61 y.o. female presenting on 06/14/2022 for Nasal Congestion, Cough, and Fatigue   HPI  Sinusitis, Congestion, Fatigue Post Viral Syndrome  Started 11 days ago with headache and fatigue for 3 days, then transitioned to severe sore throat. Congestion then started persistent drainage, developed fever and cough for several days. She described chest tightness hurt to take deep breath, some shallow breathing and wheezing crackling in lungs, keeping her awake at night. She had episode of nearly post-tussive emesis, it has improved now cough is more residual and less productive. Admits reduced appetite, feels exhausted.      03/11/2022   10:36 AM 09/16/2021    8:30 AM 03/30/2021    9:18 AM  Depression screen PHQ 2/9  Decreased Interest 0 0 1  Down, Depressed, Hopeless 0 0 0  PHQ - 2 Score 0 0 1  Altered sleeping 0 1 0  Tired, decreased energy 0 1 0  Change in appetite 0 1 3  Feeling bad or failure about yourself  0 0 0  Trouble concentrating 0 0 0  Moving slowly or fidgety/restless 0 0 0  Suicidal thoughts 0 0 0  PHQ-9 Score 0 3 4  Difficult doing work/chores Not difficult at all Not difficult at all Not difficult at all    Social History   Tobacco Use   Smoking status: Never   Smokeless tobacco: Never  Vaping Use   Vaping Use: Never used  Substance Use Topics   Alcohol use: Yes   Drug use: Never    Review of Systems Per HPI unless specifically indicated above     Objective:    BP 129/82   Pulse 90   Ht '5\' 8"'$  (1.727 m)   Wt 214 lb 12.8 oz (97.4 kg)   SpO2 97%   BMI 32.66 kg/m   Wt Readings from Last 3 Encounters:  06/14/22 214 lb 12.8 oz (97.4 kg)  04/22/22 214 lb (97.1 kg)  03/16/22 210 lb (95.3 kg)    Physical Exam Vitals and nursing note reviewed.  Constitutional:      General: She is not in acute distress.    Appearance: Normal appearance.  She is well-developed. She is not diaphoretic.     Comments: Well-appearing, comfortable, cooperative  HENT:     Head: Normocephalic and atraumatic.  Eyes:     General:        Right eye: No discharge.        Left eye: No discharge.     Conjunctiva/sclera: Conjunctivae normal.  Neck:     Thyroid: No thyromegaly.  Cardiovascular:     Rate and Rhythm: Normal rate and regular rhythm.     Heart sounds: Normal heart sounds. No murmur heard. Pulmonary:     Effort: Pulmonary effort is normal. No respiratory distress.     Breath sounds: Normal breath sounds. No wheezing or rales.  Musculoskeletal:        General: Normal range of motion.     Cervical back: Normal range of motion and neck supple.  Lymphadenopathy:     Cervical: No cervical adenopathy.  Skin:    General: Skin is warm and dry.     Findings: No erythema or rash.  Neurological:     Mental Status: She is alert and oriented to person, place, and time.  Psychiatric:  Mood and Affect: Mood normal.        Behavior: Behavior normal.        Thought Content: Thought content normal.     Comments: Well groomed, good eye contact, normal speech and thoughts      Results for orders placed or performed in visit on 12/13/21  VITAMIN D 25 Hydroxy (Vit-D Deficiency, Fractures)  Result Value Ref Range   Vit D, 25-Hydroxy 53.8 30.0 - 100.0 ng/mL  Hemoglobin A1c  Result Value Ref Range   Hgb A1c MFr Bld 5.6 4.8 - 5.6 %   Est. average glucose Bld gHb Est-mCnc 114 mg/dL      Assessment & Plan:   Problem List Items Addressed This Visit   None Visit Diagnoses     Acute non-recurrent frontal sinusitis    -  Primary   Relevant Medications   doxycycline (VIBRA-TABS) 100 MG tablet   Post viral syndrome          Consistent with acute frontal rhinosinusitis, likely initially viral URI classic history, cannot confirm type - has had negative covid testing but history is suspicious, now >10 days out, with improved overall cough but  has persistent sinusitis with concern for secondary infection  Plan: 1. Start taking Doxycycline antibiotic '100mg'$  twice daily for 10 days. Take with full glass of water and stay upright for at least 30 min after taking, may be seated or standing, but should NOT lay down. This is just a safety precaution, if this medicine does not go all the way down throat well it could cause some burning discomfort to throat and esophagus. - Avoid calcium iron dairy 2 hours with dosing. - HOLD Doxy '20mg'$  BID dosing for dermatology acne, while on this higher dose.  Return criteria reviewed. Consider CXR    Meds ordered this encounter  Medications   doxycycline (VIBRA-TABS) 100 MG tablet    Sig: Take 1 tablet (100 mg total) by mouth 2 (two) times daily. For 10 days. Take with full glass of water, stay upright 30 min after taking.    Dispense:  20 tablet    Refill:  0     Follow up plan: Return if symptoms worsen or fail to improve.  Nobie Putnam, [facility] 06/14/2022, 8:57 AM

## 2022-06-14 NOTE — Telephone Encounter (Signed)
Patient called in today regarding acne medications. She has decided not start or take the Spironolactone due to side effects she has researched. Patient was taking calcium supplement and drinking milk with/while taking Doxycycline so she thinks that this is why it has not been as effective.  Patient is asking to continue Doxycycline and increase Tretinoin Cream strength.   Please advise.

## 2022-06-14 NOTE — Patient Instructions (Addendum)
Thank you for coming to the office today  Hold the doxy '20mg'$ .  Avoid calcium dairy and iron within 2 hours of taking medicine.  Start taking Doxycycline antibiotic '100mg'$  twice daily for 10 days. Take with full glass of water and stay upright for at least 30 min after taking, may be seated or standing, but should NOT lay down. This is just a safety precaution, if this medicine does not go all the way down throat well it could cause some burning discomfort to throat and esophagus.   Please schedule a Follow-up Appointment to: Return if symptoms worsen or fail to improve.  If you have any other questions or concerns, please feel free to call the office or send a message through [place]. You may also schedule an earlier appointment if necessary.  Additionally, you may be receiving a survey about your experience at our office within a few days to 1 week by e-mail or mail. We value your feedback.  Nobie Putnam, DO [place]

## 2022-06-15 MED ORDER — TRETINOIN 0.05 % EX CREA: TOPICAL_CREAM | Freq: Every day | CUTANEOUS | 1 refills | Status: DC

## 2022-06-15 NOTE — Telephone Encounter (Signed)
Spoke with patient and advised her of medication change. RX sent in.  She does have plenty of Doxycycline on hand and will continue this after she finishes antibiotic course from PCP due to sinus infection. aw

## 2022-06-15 NOTE — Telephone Encounter (Signed)
We can increase to the next strength, tretinoin 0.05% cream nightly. Please advise she may have more dryness or irritation when she first changes to this strength.

## 2022-06-20 ENCOUNTER — Ambulatory Visit
Admission: RE | Admit: 2022-06-20 | Discharge: 2022-06-20 | Disposition: A | Discharge status: Home / Self Care | Payer: PRIVATE HEALTH INSURANCE | Source: Home / Self Care | Attending: Family Medicine | Admitting: Family Medicine

## 2022-06-20 ENCOUNTER — Ambulatory Visit: Payer: 59 | Admitting: Family Medicine

## 2022-06-20 ENCOUNTER — Ambulatory Visit
Admission: RE | Admit: 2022-06-20 | Discharge: 2022-06-20 | Disposition: A | Discharge status: Home / Self Care | Payer: PRIVATE HEALTH INSURANCE | Source: Ambulatory Visit | Attending: Family Medicine | Admitting: Family Medicine

## 2022-06-20 ENCOUNTER — Encounter: Payer: Self-pay | Admitting: Family Medicine

## 2022-06-20 VITALS — BP 123/63 | HR 85 | Temp 98.7°F | Ht 68.0 in | Wt 214.0 lb

## 2022-06-20 DIAGNOSIS — G9331 Postviral fatigue syndrome: Secondary | ICD-10-CM

## 2022-06-20 DIAGNOSIS — J9801 Acute bronchospasm: Secondary | ICD-10-CM | POA: Diagnosis present

## 2022-06-20 MED ORDER — ALBUTEROL SULFATE HFA 108 (90 BASE) MCG/ACT IN AERS: 2.0000 | INHALATION_SPRAY | Freq: Four times a day (QID) | RESPIRATORY_TRACT | 2 refills | Status: DC | PRN

## 2022-06-20 MED ORDER — PREDNISONE 20 MG PO TABS: ORAL_TABLET | ORAL | 0 refills | Status: DC

## 2022-06-20 NOTE — Progress Notes (Signed)
Subjective:    Patient ID: Jeanne Velez, female    DOB: July 26, 1961, 61 y.o.   MRN: [id number]  Jeanne Velez is a 61 y.o. female presenting on 06/20/2022 for Sinusitis   HPI  Sinusitis, Congestion, Fatigue Post Viral Syndrome Bronchospasm  Last visit 06/14/22, she was treated with dose increase of her chronic doxy for acne, from 20 BID up to '100mg'$  BID, she felt some improvement with sinus congestion. She said temp elevated from 98 to 99.51F, mild elevation to her. Most bothersome symptom is the persistent cough Admits feels lousy, overall fatigue Still having wheezing coughing spells worse at night or persistent coughing now most bothersome symptom No history of asthma or inhaler use      03/11/2022   10:36 AM 09/16/2021    8:30 AM 03/30/2021    9:18 AM  Depression screen PHQ 2/9  Decreased Interest 0 0 1  Down, Depressed, Hopeless 0 0 0  PHQ - 2 Score 0 0 1  Altered sleeping 0 1 0  Tired, decreased energy 0 1 0  Change in appetite 0 1 3  Feeling bad or failure about yourself  0 0 0  Trouble concentrating 0 0 0  Moving slowly or fidgety/restless 0 0 0  Suicidal thoughts 0 0 0  PHQ-9 Score 0 3 4  Difficult doing work/chores Not difficult at all Not difficult at all Not difficult at all    Social History   Tobacco Use   Smoking status: Never   Smokeless tobacco: Never  Vaping Use   Vaping Use: Never used  Substance Use Topics   Alcohol use: Yes   Drug use: Never    Review of Systems Per HPI unless specifically indicated above     Objective:    BP 123/63   Pulse 85   Temp 98.7 F (37.1 C) (Oral)   Ht '5\' 8"'$  (1.727 m)   Wt 214 lb (97.1 kg)   SpO2 100%   BMI 32.54 kg/m   Wt Readings from Last 3 Encounters:  06/20/22 214 lb (97.1 kg)  06/14/22 214 lb 12.8 oz (97.4 kg)  04/22/22 214 lb (97.1 kg)    Physical Exam Vitals and nursing note reviewed.  Constitutional:      General: She is not in acute distress.    Appearance: Normal appearance. She is  well-developed. She is not diaphoretic.     Comments: Well-appearing, comfortable, cooperative  HENT:     Head: Normocephalic and atraumatic.  Eyes:     General:        Right eye: No discharge.        Left eye: No discharge.     Conjunctiva/sclera: Conjunctivae normal.  Neck:     Thyroid: No thyromegaly.  Cardiovascular:     Rate and Rhythm: Normal rate and regular rhythm.     Heart sounds: Normal heart sounds. No murmur heard. Pulmonary:     Effort: Pulmonary effort is normal. No respiratory distress.     Breath sounds: Wheezing present. No rales.  Musculoskeletal:        General: Normal range of motion.     Cervical back: Normal range of motion and neck supple.  Lymphadenopathy:     Cervical: No cervical adenopathy.  Skin:    General: Skin is warm and dry.     Findings: No erythema or rash.  Neurological:     Mental Status: She is alert and oriented to person, place, and time.  Psychiatric:  Mood and Affect: Mood normal.        Behavior: Behavior normal.        Thought Content: Thought content normal.     Comments: Well groomed, good eye contact, normal speech and thoughts    Results for orders placed or performed in visit on 12/13/21  VITAMIN D 25 Hydroxy (Vit-D Deficiency, Fractures)  Result Value Ref Range   Vit D, 25-Hydroxy 53.8 30.0 - 100.0 ng/mL  Hemoglobin A1c  Result Value Ref Range   Hgb A1c MFr Bld 5.6 4.8 - 5.6 %   Est. average glucose Bld gHb Est-mCnc 114 mg/dL      Assessment & Plan:   Problem List Items Addressed This Visit   None Visit Diagnoses     Cough due to bronchospasm    -  Primary   Relevant Medications   predniSONE (DELTASONE) 20 MG tablet   albuterol (VENTOLIN HFA) 108 (90 Base) MCG/ACT inhaler   Other Relevant Orders   DG Chest 2 View   Post viral syndrome       Relevant Orders   DG Chest 2 View      Post Viral Syndrome No sign of acute infection at this time. She is wheezing with bronchospasm now seems to be secondary  to virus and also with after effects with post viral scenario No prior asthma  Fatigue likely from post viral  Will treat cough with steroid to see responsiveness   Prednisone taper 7 days, avoid or skip 1 dose of Estradiol for the first 2 days  Albuterol rescue inhaler as needed. 2 puffs every 4-6 hours. Caution elevated heart rate and jittery  X-ray today stay tuned.  Finish 4 more days of higher dose doxy  Future consider pulm if indicated for CT vs PFTs if refractory or recurrence.   Orders Placed This Encounter  Procedures   DG Chest 2 View    Standing Status:   Future    Standing Expiration Date:   06/21/2023    Order Specific Question:   Reason for Exam (SYMPTOM  OR DIAGNOSIS REQUIRED)    Answer:   postviral syndrome cough persistent, rule out pneumonia    Order Specific Question:   Is patient pregnant?    Answer:   No    Order Specific Question:   Preferred imaging location?    Answer:   [facility]     Meds ordered this encounter  Medications   predniSONE (DELTASONE) 20 MG tablet    Sig: Take daily with food. Start with '60mg'$  (3 pills) x 2 days, then reduce to '40mg'$  (2 pills) x 2 days, then '20mg'$  (1 pill) x 3 days    Dispense:  13 tablet    Refill:  0   albuterol (VENTOLIN HFA) 108 (90 Base) MCG/ACT inhaler    Sig: Inhale 2 puffs into the lungs every 6 (six) hours as needed for wheezing or shortness of breath.    Dispense:  8 g    Refill:  2      Follow up plan: Return if symptoms worsen or fail to improve.   Nobie Putnam, [facility] 06/20/2022, 3:33 PM

## 2022-06-20 NOTE — Patient Instructions (Addendum)
Thank you for coming to the office today.  Prednisone taper 7 days, avoid or skip 1 dose of Estradiol for the first 2 days  Albuterol rescue inhaler as needed. 2 puffs every 4-6 hours. Caution elevated heart rate and jittery  X-ray today stay tuned.  Finish 4 more days of higher dose doxy  Please schedule a Follow-up Appointment to: Return if symptoms worsen or fail to improve.  If you have any other questions or concerns, please feel free to call the office or send a message through [place]. You may also schedule an earlier appointment if necessary.  Additionally, you may be receiving a survey about your experience at our office within a few days to 1 week by e-mail or mail. We value your feedback.  Nobie Putnam, DO [place]

## 2022-06-22 ENCOUNTER — Encounter (INDEPENDENT_AMBULATORY_CARE_PROVIDER_SITE_OTHER): Payer: Self-pay

## 2022-06-23 ENCOUNTER — Ambulatory Visit: Payer: Self-pay

## 2022-07-01 ENCOUNTER — Ambulatory Visit: Payer: Self-pay | Admitting: *Deleted

## 2022-07-01 DIAGNOSIS — T3695XA Adverse effect of unspecified systemic antibiotic, initial encounter: Secondary | ICD-10-CM

## 2022-07-01 MED ORDER — FLUCONAZOLE 150 MG PO TABS: ORAL_TABLET | ORAL | 0 refills | Status: DC

## 2022-07-01 NOTE — Telephone Encounter (Signed)
I called her back.  She is unsure if it is worse or better.  I ordered diflucan for yeast infection  She will notify us next week if worsening.  At this point I told her I am not sure what else to do to treat her sinuses / ear / throat.  It does not sound convincing for infection. She has finished Doxycycline and prednisone  If persistent concern for infection we can try a different antibiotic or refer to ENT  Nobie Putnam, [facility] 07/01/2022, 5:06 PM

## 2022-07-01 NOTE — Addendum Note (Signed)
Addended by: Olin Hauser on: 07/01/2022 05:06 PM   Modules accepted: Orders

## 2022-07-01 NOTE — Telephone Encounter (Signed)
Summary: ear ache, sore throat   Pt states she has a rt ear ache and sore throat x1d   Provider's next appt is 8-28   Please fu w/ pt      Reason for Disposition  Earache  (Exceptions: brief ear pain of < 60 minutes duration, earache occurring during air travel  Answer Assessment - Initial Assessment Questions 1. LOCATION: "Which ear is involved?"     Right ear pain 2. ONSET: "When did the ear start hurting"      1 month- sore throat, ear ache, headache- was seen 2 weeks ago 3. SEVERITY: "How bad is the pain?"  (Scale 1-10; mild, moderate or severe)   - MILD (1-3): doesn't interfere with normal activities    - MODERATE (4-7): interferes with normal activities or awakens from sleep    - SEVERE (8-10): excruciating pain, unable to do any normal activities      Mild/moderate 4. URI SYMPTOMS: "Do you have a runny nose or cough?"     Sore throat 5. FEVER: "Do you have a fever?" If Yes, ask: "What is your temperature, how was it measured, and when did it start?"     no 6. CAUSE: "Have you been swimming recently?", "How often do you use Q-TIPS?", "Have you had any recent air travel or scuba diving?"     Recent air travel 7. OTHER SYMPTOMS: "Do you have any other symptoms?" (e.g., headache, stiff neck, dizziness, vomiting, runny nose, decreased hearing)     Headache, sore throat 8. PREGNANCY: "Is there any chance you are pregnant?" "When was your last menstrual period?"     *No Answer*  Protocols used: Bethann Punches

## 2022-07-01 NOTE — Telephone Encounter (Signed)
  Chief Complaint: ear ache, sore throat Symptoms: ear pain, sore throat- all R side Frequency: chronic viral illness- recent air travel Pertinent Negatives: Patient denies fever Disposition: '[]'$ ED /'[]'$ Urgent Care (no appt availability in office) / '[]'$ Appointment(In office/virtual)/ '[]'$  [facility]/ '[]'$ Home Care/ '[]'$ Refused Recommended Disposition /'[]'$ [facility]/ '[x]'$  Follow-up with PCP Additional Notes: Patient states she has been under care recently for viral infection- she had recent air travel and now has ear pain- R side. Patient states she can be seen today- or beginning of the week- patient is requesting Rx for Diflucan if possible- has used OTC- but not completely clear.  Patient advised the office is presently closed for lunch- will send message for provider.

## 2022-07-11 ENCOUNTER — Ambulatory Visit: Payer: 59 | Admitting: Family Medicine

## 2022-08-10 ENCOUNTER — Encounter: Payer: Self-pay | Admitting: Family Medicine

## 2022-08-10 ENCOUNTER — Telehealth: Payer: 59 | Admitting: Family Medicine

## 2022-08-10 VITALS — Ht 68.0 in | Wt 214.0 lb

## 2022-08-10 DIAGNOSIS — H6981 Other specified disorders of Eustachian tube, right ear: Secondary | ICD-10-CM

## 2022-08-10 DIAGNOSIS — B379 Candidiasis, unspecified: Secondary | ICD-10-CM

## 2022-08-10 DIAGNOSIS — G9331 Postviral fatigue syndrome: Secondary | ICD-10-CM | POA: Diagnosis not present

## 2022-08-10 DIAGNOSIS — J0110 Acute frontal sinusitis, unspecified: Secondary | ICD-10-CM

## 2022-08-10 MED ORDER — AZITHROMYCIN 250 MG PO TABS: ORAL_TABLET | ORAL | 0 refills | Status: DC

## 2022-08-10 MED ORDER — FLUCONAZOLE 150 MG PO TABS
ORAL_TABLET | ORAL | 0 refills | Status: DC
Start: 2022-08-10 — End: 2022-10-23

## 2022-08-10 MED ORDER — FLUTICASONE PROPIONATE 50 MCG/ACT NA SUSP: 2.0000 | Freq: Every day | NASAL | 3 refills | Status: AC

## 2022-08-10 NOTE — Patient Instructions (Addendum)
Thank you for coming to the office today.  Likely another round of the post viral syndrome May use albuterol as needed for cough For sinus congestion pressure Start nasal steroid Flonase 2 sprays in each nostril daily for 4-6 weeks, may repeat course seasonally or as needed Probably keep this going longer if helping Try short course 1 week or so of OTC Sudafed (behind the counter, talk to pharmacist) To cover for possible ear or sinus infection Hold low dose Doxy for 5 days while you Start Azithromycin Z pak (antibiotic) 2 tabs day 1, then 1 tab x 4 days, complete entire course even if improved Then resume Doxy  Consider change of antibiotic to stronger or add steroid again if needed for breathing/cough  If still no success, let me know we could refer to Pulmonologist or ENT consult in future depending on symptoms.   Please schedule a Follow-up Appointment to: Return if symptoms worsen or fail to improve.  If you have any other questions or concerns, please feel free to call the office or send a message through [place]. You may also schedule an earlier appointment if necessary.  Additionally, you may be receiving a survey about your experience at our office within a few days to 1 week by e-mail or mail. We value your feedback.  Nobie Putnam, DO [place]

## 2022-08-10 NOTE — Progress Notes (Signed)
Virtual Visit via MyChart Video The purpose of this virtual visit is to provide medical care while limiting exposure to the novel coronavirus (COVID19) for both patient and office staff.  Consent was obtained for Virtual visit:  Yes.   Answered questions that patient had about telehealth interaction:  Yes.   I discussed the limitations, risks, security and privacy concerns of performing an evaluation and management service by telephone. I also discussed with the patient that there may be a patient responsible charge related to this service. The patient expressed understanding and agreed to proceed.  Patient Location: Home Provider Location: [facility] (Office)  Participants in virtual visit: - Patient: Jeanne Velez - CMA: Orinda Kenner, CMA - Provider: Dr Parks Ranger  ---------------------------------------------------------------------- Chief Complaint  Patient presents with   Cough   Fatigue   Nasal Congestion    S: Reviewed CMA documentation. I have called patient and gathered additional HPI as follows:  Post Viral Syndrome Suspected COVID19 infection recently. Not confirmed w/ positive test Reports that symptoms started when returned from recent trip she felt return of similar symptoms from previous illness. She left on 07/18/22 and flew back into [place] on 07/23/22 through 07/26/22. She developed fever and sore throat again. Resumed again from 9/12-9/16.  Now 11 days later, symptoms are persistent with resolved fever, and now has persistent cough, congestion, sore throat.  Previously Doxycycline 100mg  BID some improve sinus symptoms last illness 06/2022  Same as July-August but not quite as bad.  Admits international travel and may have been exposed  Denies any known or suspected exposure to person with or possibly with [place].  Denies any fevers, chills, sweats, body ache, shortness of breath, headache, abdominal pain, diarrhea  Past Medical History:   Diagnosis Date   Acne    Allergy    Anemia    Back pain    BRCA negative 07/2020   MyRisk neg   Cataracts, bilateral    Colon polyp    Constipation    Family history of breast cancer 07/2020   IBIS=15.1%/riskscore=6.7%   Family history of skin cancer 1/32/4401   Folliculitis    GERD (gastroesophageal reflux disease)    Hyperlipidemia    Hypothyroidism    Joint pain    Macular degeneration    Osteoarthritis    Retinal vasculitis    Stress    Thyroid cancer ([place])    Social History   Tobacco Use   Smoking status: Never   Smokeless tobacco: Never  Vaping Use   Vaping Use: Never used  Substance Use Topics   Alcohol use: Yes   Drug use: Never    Current Outpatient Medications:    acetaminophen (TYLENOL) 325 MG tablet, Take 650 mg by mouth every 6 (six) hours as needed., Disp: , Rfl:    albuterol (VENTOLIN HFA) 108 (90 Base) MCG/ACT inhaler, Inhale 2 puffs into the lungs every 6 (six) hours as needed for wheezing or shortness of breath., Disp: 8 g, Rfl: 2   azithromycin (ZITHROMAX Z-PAK) 250 MG tablet, Take 2 tabs (500mg  total) on Day 1. Take 1 tab (250mg ) daily for next 4 days., Disp: 6 tablet, Rfl: 0   calcium-vitamin D (OSCAL WITH D) 500-200 MG-UNIT tablet, Take 1 tablet by mouth., Disp: , Rfl:    clindamycin (CLINDAGEL) 1 % gel, Apply 1 Application topically daily., Disp: 30 g, Rfl: 5   doxycycline (PERIOSTAT) 20 MG tablet, Take 20 mg by mouth 2 (two) times daily. 1-2 times per day, per  Dermatology, Disp: , Rfl:    estradiol (ESTRACE) 0.5 MG tablet, Take 1 tablet (0.5 mg total) by mouth daily., Disp: 90 tablet, Rfl: 1   fluticasone (FLONASE) 50 MCG/ACT nasal spray, Place 2 sprays into both nostrils daily. Use for 4-6 weeks then stop and use seasonally or as needed., Disp: 16 g, Rfl: 3   ketoconazole (NIZORAL) 2 % cream, Apply 1 application topically daily as needed for irritation., Disp: 15 g, Rfl: 5   levothyroxine (SYNTHROID) 125 MCG tablet, Take 125 mcg by mouth daily  before breakfast., Disp: , Rfl:    loratadine (CLARITIN) 10 MG tablet, Take 10 mg by mouth daily., Disp: , Rfl:    Multiple Vitamins-Minerals (PRESERVISION AREDS 2 PO), Take 1 capsule by mouth 2 (two) times daily. , Disp: , Rfl:    NON FORMULARY, Hypochlorous spray on face and back 2x daily, Disp: , Rfl:    Probiotic Product (PROBIOTIC-10 PO), Take by mouth., Disp: , Rfl:    tretinoin (RETIN-A) 0.05 % cream, Apply topically at bedtime., Disp: 45 g, Rfl: 1   valACYclovir (VALTREX) 1000 MG tablet, Take 1,000 mg by mouth 2 (two) times daily as needed. , Disp: , Rfl:    Vitamin D, Ergocalciferol, (DRISDOL) 1.25 MG (50000 UNIT) CAPS capsule, Take 1 capsule (50,000 Units total) by mouth every 7 (seven) days., Disp: 12 capsule, Rfl: 0   fluconazole (DIFLUCAN) 150 MG tablet, Take one tablet by mouth on Day 1. Repeat dose 2nd tablet on Day 3., Disp: 2 tablet, Rfl: 0     06/20/2022    3:36 PM 03/11/2022   10:36 AM 09/16/2021    8:30 AM  Depression screen PHQ 2/9  Decreased Interest 0 0 0  Down, Depressed, Hopeless 0 0 0  PHQ - 2 Score 0 0 0  Altered sleeping 0 0 1  Tired, decreased energy 0 0 1  Change in appetite 0 0 1  Feeling bad or failure about yourself  0 0 0  Trouble concentrating 0 0 0  Moving slowly or fidgety/restless 0 0 0  Suicidal thoughts 0 0 0  PHQ-9 Score 0 0 3  Difficult doing work/chores Not difficult at all Not difficult at all Not difficult at all       06/20/2022    3:36 PM 03/11/2022   10:36 AM 03/16/2021    3:58 PM  GAD 7 : Generalized Anxiety Score  Nervous, Anxious, on Edge 1 1 0  Control/stop worrying 0 0 0  Worry too much - different things 0 0 0  Trouble relaxing 2 2 0  Restless 0 0 0  Easily annoyed or irritable _0 Afraid - awful might happen 0 0 0  Total GAD 7 Score _1 Anxiety Difficulty Not difficult at all Not difficult at all Not difficult at all    -------------------------------------------------------------------------- O:   Note examination  was completely remotely via video observation objective data only  Gen - well-appearing, no acute distress or apparent pain, comfortable HEENT - eyes appear clear without discharge or redness Heart/Lungs - cannot examine virtually - observed no evidence of coughing or labored breathing. Abd - cannot examine virtually  Skin - face visible today- no rash Neuro - awake, alert, oriented Psych - not anxious appearing   Lab results reviewed.  No results found for this or any previous visit (from the past 2160 hour(s)).  -------------------------------------------------------------------------- A&P:  Problem List Items Addressed This Visit   None Visit Diagnoses  Acute non-recurrent frontal sinusitis    -  Primary   Relevant Medications   fluticasone (FLONASE) 50 MCG/ACT nasal spray   azithromycin (ZITHROMAX Z-PAK) 250 MG tablet   fluconazole (DIFLUCAN) 150 MG tablet   Antibiotic-induced yeast infection       Relevant Medications   azithromycin (ZITHROMAX Z-PAK) 250 MG tablet   fluconazole (DIFLUCAN) 150 MG tablet   Post viral syndrome       Eustachian tube dysfunction, right       Relevant Medications   fluticasone (FLONASE) 50 MCG/ACT nasal spray       Likely another round of the post viral syndrome May use albuterol as needed for cough For sinus congestion pressure Start nasal steroid Flonase 2 sprays in each nostril daily for 4-6 weeks, may repeat course seasonally or as needed Probably keep this going longer if helping Try short course 1 week or so of OTC Sudafed (behind the counter, talk to pharmacist) To cover for possible ear or sinus infection Hold low dose Doxy for 5 days while you Start Azithromycin Z pak (antibiotic) 2 tabs day 1, then 1 tab x 4 days, complete entire course even if improved Then resume Doxy  Consider change of antibiotic to stronger or add steroid again if needed for breathing/cough  If still no success, let me know we could refer to  Pulmonologist or ENT consult in future depending on symptoms.  Meds ordered this encounter  Medications   fluticasone (FLONASE) 50 MCG/ACT nasal spray    Sig: Place 2 sprays into both nostrils daily. Use for 4-6 weeks then stop and use seasonally or as needed.    Dispense:  16 g    Refill:  3   azithromycin (ZITHROMAX Z-PAK) 250 MG tablet    Sig: Take 2 tabs (523m total) on Day 1. Take 1 tab (253m daily for next 4 days.    Dispense:  6 tablet    Refill:  0   fluconazole (DIFLUCAN) 150 MG tablet    Sig: Take one tablet by mouth on Day 1. Repeat dose 2nd tablet on Day 3.    Dispense:  2 tablet    Refill:  0    Patient verbalizes understanding with the above medical recommendations including the limitation of remote medical advice.  Specific follow-up and call-back criteria were given for patient to follow-up or seek medical care more urgently if needed.  Total duration of direct patient care provided via video conference: 15 minutes    AlNobie PutnamDOMasonroup 08/10/2022, 4:42 PM

## 2022-08-19 ENCOUNTER — Ambulatory Visit: Payer: 59 | Admitting: Family Medicine

## 2022-09-07 ENCOUNTER — Ambulatory Visit: Payer: Self-pay | Admitting: Dermatology

## 2022-09-14 ENCOUNTER — Ambulatory Visit: Payer: 59 | Admitting: Dermatology

## 2022-09-14 ENCOUNTER — Encounter: Payer: Self-pay | Admitting: Dermatology

## 2022-09-14 DIAGNOSIS — L7211 Pilar cyst: Secondary | ICD-10-CM

## 2022-09-14 DIAGNOSIS — L738 Other specified follicular disorders: Secondary | ICD-10-CM

## 2022-09-14 DIAGNOSIS — L700 Acne vulgaris: Secondary | ICD-10-CM

## 2022-09-14 DIAGNOSIS — L720 Epidermal cyst: Secondary | ICD-10-CM | POA: Diagnosis not present

## 2022-09-14 DIAGNOSIS — L719 Rosacea, unspecified: Secondary | ICD-10-CM | POA: Diagnosis not present

## 2022-09-14 MED ORDER — DOXYCYCLINE 40 MG PO CPDR: DELAYED_RELEASE_CAPSULE | ORAL | 6 refills | Status: DC

## 2022-09-14 MED ORDER — DOXYCYCLINE 40 MG PO CPDR: 40.0000 mg | DELAYED_RELEASE_CAPSULE | ORAL | 6 refills | Status: DC

## 2022-09-14 NOTE — Progress Notes (Signed)
Follow-Up Visit   Subjective  Jeanne Velez is a 61 y.o. female who presents for the following: Follow-up (3 month follow up on acne at face, currently doxycycline , clindamycin, tretinion and hypoclorus spray.//Patient is also bothered by bump on scalp and behind left ear. Reports spot behind left ear is painful. ).   Cyst behind left ear  Discussed I and d   Cyst at scalp right vertex scalp pilar cyst  0.6 cyst  Dilated oil glands at face  Hx of colon cancer Heriditary growth genetic counceling  Discovered nothing  Patient has discussed with genetic counceling   The following portions of the chart were reviewed this encounter and updated as appropriate:      Review of Systems: No other skin or systemic complaints except as noted in HPI or Assessment and Plan.   Objective  Well appearing patient in no apparent distress; mood and affect are within normal limits.  A focused examination was performed including face, neck, chest, scalp. Relevant physical exam findings are noted in the Assessment and Plan.  left posterior ear Erythematous papule   face Rare open comedone with scattered inflammatory papules   face Mild mid face erythema with scattered inflammatory papules   face Small yellow papules with a central dell    Mid Parietal Scalp Smooth subcutaneous papule   Assessment & Plan  Epidermoid cyst of skin left posterior ear  I&D at left posterior ear    Incision and Drainage - left posterior ear Location: left posterior ear  Informed Consent: Discussed risks (permanent scarring, light or dark discoloration, infection, pain, bleeding, bruising, redness, damage to adjacent structures, and recurrence of the lesion) and benefits of the procedure, as well as the alternatives.  Informed consent was obtained.  Preparation: The area was prepped with alcohol.  Anesthesia: Lidocaine 1% with epinephrine.   Procedure Details: An incision was made overlying the  lesion. The lesion drained white, chalky cyst material, clear fluid, and blood.  A small amount of fluid was drained.    Antibiotic ointment and a sterile pressure dressing were applied. The patient tolerated procedure well.  Total number of lesions drained: 1  Plan: The patient was instructed on post-op care. Recommend OTC analgesia as needed for pain.   Anaerobic and Aerobic Culture - left posterior ear  Acne vulgaris face  Chronic and persistent condition with duration or expected duration over one year. Condition is symptomatic / bothersome to patient. Not to goal.  Start Oracea 40 mg tab - take 1 po qd with food.    Continue Tretinoin, Clindamycin, hypochlorous acid spray   Topical retinoid medications like tretinoin/Retin-A, adapalene/Differin, tazarotene/Fabior, and Epiduo/Epiduo Forte can cause dryness and irritation when first started. Only apply a pea-sized amount to the entire affected area. Avoid applying it around the eyes, edges of mouth and creases at the nose. If you experience irritation, use a good moisturizer first and/or apply the medicine less often. If you are doing well with the medicine, you can increase how often you use it until you are applying every night. Be careful with sun protection while using this medication as it can make you sensitive to the sun. This medicine should not be used by pregnant women.    Doxycycline should be taken with food to prevent nausea. Do not lay down for 30 minutes after taking. Be cautious with sun exposure and use good sun protection while on this medication. Pregnant women should not take this medication.  Rosacea face  Chronic and persistent condition with duration or expected duration over one year. Condition is symptomatic/ bothersome to patient. Not currently at goal.  With acne   Rosacea is a chronic progressive skin condition usually affecting the face of adults, causing redness and/or acne bumps. It is treatable  but not curable. It sometimes affects the eyes (ocular rosacea) as well. It may respond to topical and/or systemic medication and can flare with stress, sun exposure, alcohol, exercise, topical steroids (including hydrocortisone/cortisone 10) and some foods.  Daily application of broad spectrum spf 30+ sunscreen to face is recommended to reduce flares.  Failed doxy 20 bid, failed clindamycin and tretinion  Will start oracea 40 mg capsule by mouth every morning    Continue Tretinoin, Clindamycin, hypochlorous acid spray   Topical retinoid medications like tretinoin/Retin-A, adapalene/Differin, tazarotene/Fabior, and Epiduo/Epiduo Forte can cause dryness and irritation when first started. Only apply a pea-sized amount to the entire affected area. Avoid applying it around the eyes, edges of mouth and creases at the nose. If you experience irritation, use a good moisturizer first and/or apply the medicine less often. If you are doing well with the medicine, you can increase how often you use it until you are applying every night. Be careful with sun protection while using this medication as it can make you sensitive to the sun. This medicine should not be used by pregnant women.    Doxycycline should be taken with food to prevent nausea. Do not lay down for 30 minutes after taking. Be cautious with sun exposure and use good sun protection while on this medication. Pregnant women should not take this medication.     Related Medications doxycycline (ORACEA) 40 MG capsule Take one tablet by mouth daily with food. Avoid taking at the same time as dairy or supplements. Do not lay down for 30 minutes after taking. Be cautious with sun exposure and use good sun protection while on this medication.  Sebaceous hyperplasia of face face  Benign. Observe  Patient is bothered and would like to discuss treatment  Discussed cautery or peel treatment    Pilar cyst Mid Parietal Scalp  Benign-appearing. Exam  most consistent with a pilar cyst. Discussed that a cyst is a benign growth that can grow over time and sometimes get irritated or inflamed. Recommend observation if it is not bothersome. Discussed option of surgical excision to remove it if it is growing, symptomatic, or other changes noted. Please call for new or changing lesions so they can be evaluated.    Return in about 6 months (around 03/15/2023) for keep follow up as scheduled 01/05/23. I, Ruthell Rummage, CMA, am acting as scribe for Forest Gleason, MD.  Documentation: I have reviewed the above documentation for accuracy and completeness, and I agree with the above.  Forest Gleason, MD

## 2022-09-14 NOTE — Patient Instructions (Addendum)
Incision and Drainage, Care After This sheet gives you information about how to care for yourself after your procedure. Your health care provider may also give you more specific instructions. If you have problems or questions, contact your health care provider. What can I expect after the procedure? After the procedure, it is common to have: Pain or discomfort around the incision site. Blood, fluid, or pus (drainage) from the incision. Redness and firm skin around the incision site. Follow these instructions at home: Medicines Take over-the-counter and prescription medicines only as told by your health care provider. If you were prescribed an antibiotic medicine, use or take it as told by your health care provider. Do not stop using the antibiotic even if you start to feel better. Wound care Follow instructions from your health care provider about how to take care of your wound. Make sure you: Wash your hands with soap and water before and after you change your bandage (dressing). If soap and water are not available, use hand sanitizer. Change your dressing and packing as told by your health care provider. If your dressing is dry or stuck when you try to remove it, moisten or wet the dressing with saline or water so that it can be removed without harming your skin or tissues. If your wound is packed, leave it in place until your health care provider tells you to remove it. To remove the packing, moisten or wet the packing with saline or water so that it can be removed without harming your skin or tissues. Leave stitches (sutures), skin glue, or adhesive strips in place. These skin closures may need to stay in place for 2 weeks or longer. If adhesive strip edges start to loosen and curl up, you may trim the loose edges. Do not remove adhesive strips completely unless your health care provider tells you to do that. Check your wound every day for signs of infection. Check for: More redness, swelling,  or pain. More fluid or blood. Warmth. Pus or a bad smell. If you were sent home with a drain tube in place, follow instructions from your health care provider about: How to empty it. How to care for it at home.  General instructions Rest the affected area. Do not take baths, swim, or use a hot tub until your health care provider approves. Ask your health care provider if you may take showers. You may only be allowed to take sponge baths. Return to your normal activities as told by your health care provider. Ask your health care provider what activities are safe for you. Your health care provider may put you on activity or lifting restrictions. The incision will continue to drain. It is normal to have some clear or slightly bloody drainage. The amount of drainage should lessen each day. Do not apply any creams, ointments, or liquids unless you have been told to by your health care provider. Keep all follow-up visits as told by your health care provider. This is important. Contact a health care provider if: Your cyst or abscess returns. You have more redness, swelling, or pain around your incision. You have more fluid or blood coming from your incision. Your incision feels warm to the touch. You have pus or a bad smell coming from your incision. You have red streaks above or below the incision site. Get help right away if: You have severe pain or bleeding. You cannot eat or drink without vomiting. You have a fever or chills. You have redness that spreads  quickly. You have decreased urine output. You become short of breath. You have chest pain. You cough up blood. The affected area becomes numb or starts to tingle. These symptoms may represent a serious problem that is an emergency. Do not wait to see if the symptoms will go away. Get medical help right away. Call your local emergency services (911 in the [place] Do not drive yourself to the hospital. Summary After this procedure, it is  common to have fluid, blood, or pus coming from the surgery site. Follow all home care instructions. You will be told how to take care of your incision, how to check for infection, and how to take medicines. If you were prescribed an antibiotic medicine, take it as told by your health care provider. Do not stop taking the antibiotic even if you start to feel better. Contact a health care provider if you have increased redness, swelling, or pain around your incision. Get help right away if you have chest pain, you vomit, you cough up blood, or you have shortness of breath. Keep all follow-up visits as told by your health care provider. This is important. This information is not intended to replace advice given to you by your health care provider. Make sure you discuss any questions you have with your health care provider. Document Revised: 02/03/2022 Document Reviewed: 08/12/2021 Elsevier Patient Education  [place].     Doxycycline should be taken with food to prevent nausea. Do not lay down for 30 minutes after taking. Be cautious with sun exposure and use good sun protection while on this medication. Pregnant women should not take this medication.      Due to recent changes in healthcare laws, you may see results of your pathology and/or laboratory studies on MyChart before the doctors have had a chance to review them. We understand that in some cases there may be results that are confusing or concerning to you. Please understand that not all results are received at the same time and often the doctors may need to interpret multiple results in order to provide you with the best plan of care or course of treatment. Therefore, we ask that you please give [place] 2 business days to thoroughly review all your results before contacting the office for clarification. Should we see a critical lab result, you will be contacted sooner.   If You Need Anything After Your Visit  If you have any  questions or concerns for your doctor, please call our main line at [phone] and press option 4 to reach your doctor's medical assistant. If no one answers, please leave a voicemail as directed and we will return your call as soon as possible. Messages left after 4 pm will be answered the following business day.   You may also send [place] a message via [place]. We typically respond to MyChart messages within 1-2 business days.  For prescription refills, please ask your pharmacy to contact our office. Our fax number is [phone].  If you have an urgent issue when the clinic is closed that cannot wait until the next business day, you can page your doctor at the number below.    Please note that while we do our best to be available for urgent issues outside of office hours, we are not available 24/7.   If you have an urgent issue and are unable to reach [place], you may choose to seek medical care at your doctor's office, retail clinic, urgent care center, or emergency room.  If you have a medical emergency, please immediately call 911 or go to the emergency department.  Pager Numbers  - Dr. Nehemiah Massed: [phone]  - Dr. Laurence Ferrari: [phone]  - Dr. Nicole Kindred: [phone]  In the event of inclement weather, please call our main line at [phone] for an update on the status of any delays or closures.  Dermatology Medication Tips: Please keep the boxes that topical medications come in in order to help keep track of the instructions about where and how to use these. Pharmacies typically print the medication instructions only on the boxes and not directly on the medication tubes.   If your medication is too expensive, please contact our office at [phone] option 4 or send [place] a message through [place].   We are unable to tell what your co-pay for medications will be in advance as this is different depending on your insurance coverage. However, we may be able to find a substitute medication at  lower cost or fill out paperwork to get insurance to cover a needed medication.   If a prior authorization is required to get your medication covered by your insurance company, please allow [place] 1-2 business days to complete this process.  Drug prices often vary depending on where the prescription is filled and some pharmacies may offer cheaper prices.  The website www.goodrx.com contains coupons for medications through different pharmacies. The prices here do not account for what the cost may be with help from insurance (it may be cheaper with your insurance), but the website can give you the price if you did not use any insurance.  - You can print the associated coupon and take it with your prescription to the pharmacy.  - You may also stop by our office during regular business hours and pick up a GoodRx coupon card.  - If you need your prescription sent electronically to a different pharmacy, notify our office through [facility] or by phone at [phone] option 4.     Si Usted Necesita Algo Despus de Su Visita  Tambin puede enviarnos un mensaje a travs de Pharmacist, community. Por lo general respondemos a los mensajes de MyChart en el transcurso de 1 a 2 das hbiles.  Para renovar recetas, por favor pida a su farmacia que se ponga en contacto con nuestra oficina. Harland Dingwall de fax es [place] [phone].  Si tiene un asunto urgente cuando la clnica est cerrada y que no puede esperar hasta el siguiente da hbil, puede llamar/localizar a su doctor(a) al nmero que aparece a continuacin.   Por favor, tenga en cuenta que aunque hacemos todo lo posible para estar disponibles para asuntos urgentes fuera del horario de [place], no estamos disponibles las 24 horas del da, los 7 das de la [place].   Si tiene un problema urgente y no puede comunicarse con nosotros, puede optar por buscar atencin mdica  en el consultorio de su doctor(a), en una clnica privada, en un centro de atencin urgente  o en una sala de emergencias.  Si tiene Engineering geologist, por favor llame inmediatamente al 911 o vaya a la sala de emergencias.  Nmeros de bper  - Dr. Nehemiah Massed: [phone]  - Dra. Moye: [phone]  - Dra. Nicole Kindred: [phone]  En caso de inclemencias del [place], por favor llame a Johnsie Kindred principal al [phone] para una actualizacin sobre el [place] de cualquier retraso o cierre.  Consejos para la medicacin en dermatologa: Por favor, guarde las cajas en las que vienen los medicamentos de  uso tpico para ayudarle a seguir las [employer] dnde y cmo usarlos. Las farmacias generalmente imprimen las instrucciones del medicamento slo en las cajas y no directamente en los tubos del [place].   Si su medicamento es muy caro, por favor, pngase en contacto con Zigmund Daniel llamando al [phone] y presione la opcin 4 o envenos un mensaje a travs de Pharmacist, community.   No podemos decirle cul ser su copago por los medicamentos por adelantado ya que esto es diferente dependiendo de la cobertura de su seguro. Sin embargo, es posible que podamos encontrar un medicamento sustituto a Electrical engineer un formulario para que el seguro cubra el medicamento que se considera necesario.   Si se requiere una autorizacin previa para que su compaa de seguros [place] su medicamento, por favor permtanos de 1 a 2 das hbiles para completar este proceso.  Los precios de los medicamentos varan con frecuencia dependiendo del Environmental consultant de dnde se surte la receta y alguna farmacias pueden ofrecer precios ms baratos.  El sitio web www.goodrx.com tiene cupones para medicamentos de Airline pilot. Los precios aqu no tienen en cuenta lo que podra costar con la ayuda del seguro (puede ser ms barato con su seguro), pero el sitio web puede darle el precio si no utiliz Research scientist (physical sciences).  - Puede imprimir el cupn correspondiente y llevarlo con su receta a la farmacia.  - Tambin  puede pasar por nuestra oficina durante el horario de atencin regular y Charity fundraiser una tarjeta de cupones de GoodRx.  - Si necesita que su receta se enve electrnicamente a una farmacia diferente, informe a nuestra oficina a travs de MyChart de [place] o por telfono llamando al [phone] y presione la opcin 4.

## 2022-09-20 ENCOUNTER — Telehealth: Payer: Self-pay

## 2022-09-20 LAB — ANAEROBIC AND AEROBIC CULTURE

## 2022-09-20 NOTE — Telephone Encounter (Signed)
-----   Message from Alfonso Patten, MD sent at 09/20/2022  2:54 PM EST ----- Culture grew Cutibacterium acnes and Staph epidermidis. If she is doing well after drainage, no antibiotic needed. If she is tender at the area, please let me know and we may consider an antibiotic.  MAs please call. Thank you!

## 2022-09-20 NOTE — Telephone Encounter (Signed)
Patient advised culture grew some staph, doing well and will let us know if not continuing to improve. Lurlean Horns., RMA

## 2022-10-03 ENCOUNTER — Ambulatory Visit
Admission: RE | Admit: 2022-10-03 | Discharge: 2022-10-03 | Disposition: A | Discharge status: Home / Self Care | Payer: PRIVATE HEALTH INSURANCE | Source: Ambulatory Visit | Attending: Obstetrics and Gynecology | Admitting: Obstetrics and Gynecology

## 2022-10-03 ENCOUNTER — Encounter: Payer: Self-pay | Admitting: Obstetrics and Gynecology

## 2022-10-03 DIAGNOSIS — R928 Other abnormal and inconclusive findings on diagnostic imaging of breast: Secondary | ICD-10-CM | POA: Diagnosis present

## 2022-10-04 ENCOUNTER — Other Ambulatory Visit: Payer: Self-pay | Admitting: Obstetrics and Gynecology

## 2022-10-04 DIAGNOSIS — Z79890 Hormone replacement therapy (postmenopausal): Secondary | ICD-10-CM

## 2022-10-04 DIAGNOSIS — N951 Menopausal and female climacteric states: Secondary | ICD-10-CM

## 2022-10-23 ENCOUNTER — Other Ambulatory Visit: Payer: Self-pay | Admitting: Family Medicine

## 2022-10-23 DIAGNOSIS — B379 Candidiasis, unspecified: Secondary | ICD-10-CM

## 2022-10-24 MED ORDER — FLUCONAZOLE 150 MG PO TABS: ORAL_TABLET | ORAL | 0 refills | Status: DC

## 2022-10-30 NOTE — Progress Notes (Unsigned)
PCP: Olin Hauser, DO   No chief complaint on file.    HPI:      Jeanne Velez is a 61 y.o. No obstetric history on file. whose LMP was No LMP recorded. Patient has had a hysterectomy., presents today for her annual examination.  Her menses are absent due to TAH/BSO due to bilat dermoids/ovar cysts. She does have vasomotor sx and is currently on estradiol 0.5 mg 3 times weekly with sx control.   Sex activity: not currently sex active, contraception - status post hysterectomy. She does not have vaginal dryness. Had muscular vag pain with certain movements and sex 7/22; did vag ERT samples for 8 nights and restarted kegels and sx improved, but also no longer sex active, so not as problematic. Had discussed pelvic PT prn.  Last Pap: not recent; s/p hyst. No hx of abn paps. Hx of STDs: HSV  Last mammogram: 10/03/22 Results were: normal--dx mammo and RT breast u/s follow-up in 12 months. 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 Pt states it's deep and not palpable, but does ache sometimes (denies caffeine use, no change with ERT dose). Hx of other RT breast mass in past with breast MRIs and neg breast bx.   There is a FH of breast cancer in her MGM and pat cousin, as well as melanoma in her mat aunt, genetic testing done. There is no FH of ovarian cancer. Pt is MyRisk neg 9/21; IBIS=15.1%/riskscore=6.7%. The patient does do self-breast exams.  Colonoscopy: 2020 with polyps;  Repeat due after 3 yrs years.  DEXA: a few yrs ago, normal spine and hip.  Tobacco use: The patient denies current or previous tobacco use. Alcohol use: none  No drug use Exercise: moderately active  She does get adequate calcium and Vitamin D in her diet. Hx of Vit D deficiency and on Rx Vit D currently. Doing wt loss [facility]. Has lost 17# since 5/22.  Has long haul Covid sx when stressed, last a few days.   Labs with PCP.   Past  Medical History:  Diagnosis Date   Acne    Allergy    Anemia    Back pain    BRCA negative 07/2020   MyRisk neg   Cataracts, bilateral    Colon polyp    Constipation    Family history of breast cancer 07/2020   IBIS=15.1%/riskscore=6.7%   Family history of skin cancer 7/56/4332   Folliculitis    GERD (gastroesophageal reflux disease)    Hyperlipidemia    Hypothyroidism    Joint pain    Macular degeneration    Osteoarthritis    Retinal vasculitis    Stress    Thyroid cancer ([place])     Past Surgical History:  Procedure Laterality Date   BREAST BIOPSY Right    yrs ago benign, no marker   FLEXIBLE SIGMOIDOSCOPY N/A 04/28/2021   Procedure: FLEXIBLE SIGMOIDOSCOPY;  Surgeon: Lin Landsman, MD;  Location: [facility] ENDOSCOPY;  Service: Gastroenterology;  Laterality: N/A;   ovarial cystectomy     THYROIDECTOMY     TOTAL ABDOMINAL HYSTERECTOMY W/ BILATERAL SALPINGOOPHORECTOMY     dermoids, ovar cysts   TUBAL LIGATION      Family History  Problem Relation Age of Onset   Myasthenia gravis Mother    Heart disease Father    Depression Father    Skin cancer Father  dx 22s; face/scalp   Anxiety disorder Father    Obesity Father    Bone cancer Maternal Aunt 15   Melanoma Maternal Aunt        dx 41s; bottom of foot   Breast cancer Maternal Grandmother        dx unknown age   Skin cancer Maternal Grandmother        dx unknown age   Breast cancer Cousin        dx 60s; paternal female cousin   Melanoma Cousin        dx 47s; paternal female cousin    Social History   Socioeconomic History   Marital status: Married    Spouse name: Not on file   Number of children: Not on file   Years of education: Not on file   Highest education level: Not on file  Occupational History   Occupation: Recreational therapist  Tobacco Use   Smoking status: Never   Smokeless tobacco: Never  Vaping Use   Vaping Use: Never used  Substance and Sexual Activity   Alcohol use: Yes    Drug use: Never   Sexual activity: Yes    Birth control/protection: Surgical    Comment: Hysterectomy  Other Topics Concern   Not on file  Social History Narrative   Not on file   Social Determinants of Health   Financial Resource Strain: Not on file  Food Insecurity: Not on file  Transportation Needs: Not on file  Physical Activity: Not on file  Stress: Not on file  Social Connections: Not on file  Intimate Partner Violence: Not on file     Current Outpatient Medications:    acetaminophen (TYLENOL) 325 MG tablet, Take 650 mg by mouth every 6 (six) hours as needed., Disp: , Rfl:    albuterol (VENTOLIN HFA) 108 (90 Base) MCG/ACT inhaler, Inhale 2 puffs into the lungs every 6 (six) hours as needed for wheezing or shortness of breath., Disp: 8 g, Rfl: 2   azithromycin (ZITHROMAX Z-PAK) 250 MG tablet, Take 2 tabs (549m total) on Day 1. Take 1 tab (2533m daily for next 4 days., Disp: 6 tablet, Rfl: 0   calcium-vitamin D (OSCAL WITH D) 500-200 MG-UNIT tablet, Take 1 tablet by mouth., Disp: , Rfl:    clindamycin (CLINDAGEL) 1 % gel, Apply 1 Application topically daily., Disp: 30 g, Rfl: 5   doxycycline (ORACEA) 40 MG capsule, Take one tablet by mouth daily with food. Avoid taking at the same time as dairy or supplements. Do not lay down for 30 minutes after taking. Be cautious with sun exposure and use good sun protection while on this medication., Disp: 30 capsule, Rfl: 6   estradiol (ESTRACE) 0.5 MG tablet, Take 1 tablet (0.5 mg total) by mouth daily., Disp: 90 tablet, Rfl: 1   fluconazole (DIFLUCAN) 150 MG tablet, Take one tablet by mouth on Day 1. Repeat dose 2nd tablet on Day 3., Disp: 2 tablet, Rfl: 0   fluticasone (FLONASE) 50 MCG/ACT nasal spray, Place 2 sprays into both nostrils daily. Use for 4-6 weeks then stop and use seasonally or as needed., Disp: 16 g, Rfl: 3   ketoconazole (NIZORAL) 2 % cream, Apply 1 application topically daily as needed for irritation., Disp: 15 g, Rfl:  5   levothyroxine (SYNTHROID) 125 MCG tablet, Take 125 mcg by mouth daily before breakfast., Disp: , Rfl:    loratadine (CLARITIN) 10 MG tablet, Take 10 mg by mouth daily., Disp: , Rfl:  Multiple Vitamins-Minerals (PRESERVISION AREDS 2 PO), Take 1 capsule by mouth 2 (two) times daily. , Disp: , Rfl:    NON FORMULARY, Hypochlorous spray on face and back 2x daily, Disp: , Rfl:    Probiotic Product (PROBIOTIC-10 PO), Take by mouth., Disp: , Rfl:    tretinoin (RETIN-A) 0.05 % cream, Apply topically at bedtime., Disp: 45 g, Rfl: 1   valACYclovir (VALTREX) 1000 MG tablet, Take 1,000 mg by mouth 2 (two) times daily as needed. , Disp: , Rfl:    Vitamin D, Ergocalciferol, (DRISDOL) 1.25 MG (50000 UNIT) CAPS capsule, Take 1 capsule (50,000 Units total) by mouth every 7 (seven) days., Disp: 12 capsule, Rfl: 0     ROS:  Review of Systems  Constitutional:  Positive for fatigue. Negative for fever and unexpected weight change.  Respiratory:  Negative for cough, shortness of breath and wheezing.   Cardiovascular:  Negative for chest pain, palpitations and leg swelling.  Gastrointestinal:  Negative for blood in stool, constipation, diarrhea, nausea and vomiting.  Endocrine: Negative for cold intolerance, heat intolerance and polyuria.  Genitourinary:  Negative for dyspareunia, dysuria, flank pain, frequency, genital sores, hematuria, menstrual problem, pelvic pain, urgency, vaginal bleeding, vaginal discharge and vaginal pain.  Musculoskeletal:  Negative for back pain, joint swelling and myalgias.  Skin:  Negative for rash.  Neurological:  Positive for headaches. Negative for dizziness, syncope, light-headedness and numbness.  Hematological:  Negative for adenopathy.  Psychiatric/Behavioral:  Negative for agitation, confusion, sleep disturbance and suicidal ideas. The patient is not nervous/anxious.    BREAST: tenderness   Objective: There were no vitals taken for this visit.   Physical  Exam Constitutional:      Appearance: She is well-developed.  Genitourinary:     Vulva normal.     Genitourinary Comments: UTERUS/CX SURG REM     Right Labia: No rash, tenderness or lesions.    Left Labia: No tenderness, lesions or rash.    Vaginal cuff intact.    No vaginal discharge, erythema or tenderness.      Right Adnexa: absent.    Right Adnexa: not tender and no mass present.    Left Adnexa: absent.    Left Adnexa: not tender and no mass present.    Cervix is absent.     Uterus is absent.  Breasts:    Right: No mass, nipple discharge, skin change or tenderness.     Left: No mass, nipple discharge, skin change or tenderness.  Neck:     Thyroid: No thyromegaly.  Cardiovascular:     Rate and Rhythm: Normal rate and regular rhythm.     Heart sounds: Normal heart sounds. No murmur heard. Pulmonary:     Effort: Pulmonary effort is normal.     Breath sounds: Normal breath sounds.  Abdominal:     Palpations: Abdomen is soft.     Tenderness: There is no abdominal tenderness. There is no guarding.  Musculoskeletal:        General: Normal range of motion.     Cervical back: Normal range of motion.  Neurological:     General: No focal deficit present.     Mental Status: She is alert and oriented to person, place, and time.     Cranial Nerves: No cranial nerve deficit.  Skin:    General: Skin is warm and dry.  Psychiatric:        Mood and Affect: Mood normal.        Behavior: Behavior normal.  Thought Content: Thought content normal.        Judgment: Judgment normal.  Vitals reviewed.     Assessment/Plan:  Encounter for annual routine gynecological examination  Encounter for screening mammogram for malignant neoplasm of breast - Plan: MM 3D SCREEN BREAST BILATERAL; pt to sched mammo  Family history of breast cancer--MyRisk neg; no increased screening recommended.   Hormone replacement therapy (HRT) - Plan: estradiol (ESTRACE) 0.5 MG tablet  Vasomotor  symptoms due to menopause - Plan: estradiol (ESTRACE) 0.5 MG tablet; Rx RF. Doing well with 3 x qwk. F/u prn.     Meds ordered this encounter  Medications                         Gae Dry [[id number]]   estradiol (ESTRACE) 0.5 MG tablet    Sig: Take 1 tablet (0.5 mg total) by mouth daily.    Dispense:  90 tablet    Refill:  3    Order Specific Question:   Supervising Provider    Answer:   Gae Dry [[id number]]            GYN counsel breast self exam, mammography screening, use and side effects of HRT, menopause, adequate intake of calcium and vitamin D, diet and exercise    F/U  No follow-ups on file.  Jeanne Mckiver B. Lugene Hitt, PA-C 10/30/2022 7:49 PM

## 2022-10-31 ENCOUNTER — Encounter: Payer: Self-pay | Admitting: Obstetrics and Gynecology

## 2022-10-31 ENCOUNTER — Ambulatory Visit (INDEPENDENT_AMBULATORY_CARE_PROVIDER_SITE_OTHER): Payer: PRIVATE HEALTH INSURANCE | Admitting: Obstetrics and Gynecology

## 2022-10-31 VITALS — BP 120/74 | Ht 68.0 in | Wt 223.0 lb

## 2022-10-31 DIAGNOSIS — Z01419 Encounter for gynecological examination (general) (routine) without abnormal findings: Secondary | ICD-10-CM | POA: Diagnosis not present

## 2022-10-31 DIAGNOSIS — N951 Menopausal and female climacteric states: Secondary | ICD-10-CM

## 2022-10-31 DIAGNOSIS — Z1231 Encounter for screening mammogram for malignant neoplasm of breast: Secondary | ICD-10-CM

## 2022-10-31 DIAGNOSIS — Z79890 Hormone replacement therapy (postmenopausal): Secondary | ICD-10-CM | POA: Diagnosis not present

## 2022-10-31 DIAGNOSIS — Z1211 Encounter for screening for malignant neoplasm of colon: Secondary | ICD-10-CM

## 2022-10-31 DIAGNOSIS — Z803 Family history of malignant neoplasm of breast: Secondary | ICD-10-CM

## 2022-10-31 MED ORDER — ESTRADIOL 0.5 MG PO TABS: 0.5000 mg | ORAL_TABLET | Freq: Every day | ORAL | 3 refills | Status: DC

## 2022-10-31 MED ORDER — ESTRADIOL 0.5 MG PO TABS: 0.5000 mg | ORAL_TABLET | Freq: Every day | ORAL | 0 refills | Status: DC

## 2022-10-31 NOTE — Patient Instructions (Signed)
I value your feedback and you entrusting us with your care. If you get a [place] patient survey, I would appreciate you taking the time to let us know about your experience today. Thank you! ? ? ?

## 2022-11-01 DIAGNOSIS — Z0289 Encounter for other administrative examinations: Secondary | ICD-10-CM

## 2022-11-02 ENCOUNTER — Encounter: Payer: Self-pay | Admitting: Obstetrics and Gynecology

## 2022-11-03 MED ORDER — FLUCONAZOLE 150 MG PO TABS: 150.0000 mg | ORAL_TABLET | Freq: Once | ORAL | 0 refills | Status: AC

## 2022-11-09 ENCOUNTER — Ambulatory Visit (INDEPENDENT_AMBULATORY_CARE_PROVIDER_SITE_OTHER): Payer: 59 | Admitting: Physician Assistant

## 2022-11-09 ENCOUNTER — Encounter (INDEPENDENT_AMBULATORY_CARE_PROVIDER_SITE_OTHER): Payer: Self-pay | Admitting: Physician Assistant

## 2022-11-09 VITALS — BP 132/83 | HR 83 | Temp 98.0°F | Ht 68.0 in | Wt 222.0 lb

## 2022-11-09 DIAGNOSIS — R5383 Other fatigue: Secondary | ICD-10-CM

## 2022-11-09 DIAGNOSIS — E669 Obesity, unspecified: Secondary | ICD-10-CM

## 2022-11-09 DIAGNOSIS — R7303 Prediabetes: Secondary | ICD-10-CM | POA: Diagnosis not present

## 2022-11-09 DIAGNOSIS — E039 Hypothyroidism, unspecified: Secondary | ICD-10-CM | POA: Diagnosis not present

## 2022-11-09 DIAGNOSIS — E559 Vitamin D deficiency, unspecified: Secondary | ICD-10-CM

## 2022-11-09 DIAGNOSIS — Z6833 Body mass index (BMI) 33.0-33.9, adult: Secondary | ICD-10-CM

## 2022-11-09 DIAGNOSIS — E782 Mixed hyperlipidemia: Secondary | ICD-10-CM

## 2022-11-10 LAB — CMP14+EGFR
ALT: 53 IU/L — ABNORMAL HIGH (ref 0–32)
AST: 37 IU/L (ref 0–40)
Albumin/Globulin Ratio: 1.5 (ref 1.2–2.2)
Albumin: 4.5 g/dL (ref 3.8–4.9)
Alkaline Phosphatase: 108 IU/L (ref 44–121)
BUN/Creatinine Ratio: 21 (ref 9–23)
BUN: 17 mg/dL (ref 6–24)
Bilirubin Total: 1 mg/dL (ref 0.0–1.2)
CO2: 24 mmol/L (ref 20–29)
Calcium: 9 mg/dL (ref 8.7–10.2)
Chloride: 100 mmol/L (ref 96–106)
Creatinine, Ser: 0.8 mg/dL (ref 0.57–1.00)
Globulin, Total: 3 g/dL (ref 1.5–4.5)
Glucose: 102 mg/dL — ABNORMAL HIGH (ref 70–99)
Potassium: 4.6 mmol/L (ref 3.5–5.2)
Sodium: 138 mmol/L (ref 134–144)
Total Protein: 7.5 g/dL (ref 6.0–8.5)
eGFR: 85 mL/min/{1.73_m2} (ref >59)

## 2022-11-10 LAB — CBC WITH DIFFERENTIAL/PLATELET
Basophils Absolute: 0 10*3/uL (ref 0.0–0.2)
Basos: 1 %
EOS (ABSOLUTE): 0.2 10*3/uL (ref 0.0–0.4)
Eos: 3 %
Hematocrit: 42.6 % (ref 34.0–46.6)
Hemoglobin: 14.2 g/dL (ref 11.1–15.9)
Immature Grans (Abs): 0 10*3/uL (ref 0.0–0.1)
Immature Granulocytes: 0 %
Lymphocytes Absolute: 1.7 10*3/uL (ref 0.7–3.1)
Lymphs: 35 %
MCH: 30.3 pg (ref 26.6–33.0)
MCHC: 33.3 g/dL (ref 31.5–35.7)
MCV: 91 fL (ref 79–97)
Monocytes Absolute: 0.4 10*3/uL (ref 0.1–0.9)
Monocytes: 7 %
Neutrophils Absolute: 2.7 10*3/uL (ref 1.4–7.0)
Neutrophils: 54 %
Platelets: 234 10*3/uL (ref 150–450)
RBC: 4.68 x10E6/uL (ref 3.77–5.28)
RDW: 11.9 % (ref 11.7–15.4)
WBC: 5 10*3/uL (ref 3.4–10.8)

## 2022-11-10 LAB — LIPID PANEL WITH LDL/HDL RATIO
Cholesterol, Total: 222 mg/dL — ABNORMAL HIGH (ref 100–199)
HDL: 57 mg/dL (ref >39)
LDL Chol Calc (NIH): 145 mg/dL — ABNORMAL HIGH (ref 0–99)
LDL/HDL Ratio: 2.5 ratio (ref 0.0–3.2)
Triglycerides: 114 mg/dL (ref 0–149)
VLDL Cholesterol Cal: 20 mg/dL (ref 5–40)

## 2022-11-10 LAB — INSULIN, RANDOM: INSULIN: 10 u[IU]/mL (ref 2.6–24.9)

## 2022-11-10 LAB — HEMOGLOBIN A1C
Est. average glucose Bld gHb Est-mCnc: 117 mg/dL
Hgb A1c MFr Bld: 5.7 % — ABNORMAL HIGH (ref 4.8–5.6)

## 2022-11-10 LAB — VITAMIN D 25 HYDROXY (VIT D DEFICIENCY, FRACTURES): Vit D, 25-Hydroxy: 36.4 ng/mL (ref 30.0–100.0)

## 2022-11-10 LAB — FOLATE: Folate: >20 ng/mL (ref >3.0)

## 2022-11-10 LAB — VITAMIN B12: Vitamin B-12: 852 pg/mL (ref 232–1245)

## 2022-11-15 ENCOUNTER — Encounter (INDEPENDENT_AMBULATORY_CARE_PROVIDER_SITE_OTHER): Payer: Self-pay | Admitting: Physician Assistant

## 2022-11-15 ENCOUNTER — Encounter: Payer: Self-pay | Admitting: Dermatology

## 2022-11-17 ENCOUNTER — Ambulatory Visit: Payer: PRIVATE HEALTH INSURANCE | Admitting: Dermatology

## 2022-11-23 NOTE — Progress Notes (Unsigned)
Chief Complaint:   OBESITY Jeanne Velez is here to discuss her progress with her obesity treatment plan along with follow-up of her obesity related diagnoses. Jeanne Velez is on the Category 2 Plan and states she is following her eating plan approximately 0% of the time. Jeanne Velez states she is walking 30 minutes 4 times per week.  Today's visit was #: 61 Starting weight: 220 lbs Starting date: 03/30/2021 Today's weight: 222 lbs Today's date: 11/09/2022 Total lbs lost to date: 0 lbs Total lbs lost since last in-office visit: 0  Interim History: Last office visit with [facility] was on 03/16/22 with Mina Marble, NP.   She got very sick in late summer with URI/? Covid-like illness and was very sick for several months and required treatment with steroids, rounds of antibiotics and was really not able to focus much on healthy eating/weight loss. Was doing a lot of "comfort eating" while sick during the past few months. She has been traveling a lot for work as well. She is wanting to get back on track with her healthy eating plan and exercise to promote weight loss.  Subjective:   1. Vitamin D deficiency Taking over the counter Vit D - unknown dose. Last Vit D level of 53.8 on 12/13/21--was taking ergocalciferol weekly. No nausea, vomiting, muscle weakness or other side effects when taking Ergocalciferol previously.   2. Pre-diabetes Last A1c was 5.6 on 11/16/21. Ready to resume a prescribed nutrition plan working to decrease simple carbs, increase lean protein and exercise to promote weight loss.  3. Hypothyroidism, unspecified type Sees Endocrinology--T[facility]-0.821 at [facility]. Taking synthroid 125 mcg daily--no side effects.  4. Mixed hyperlipidemia No recent labs, not on medications for lipids.    5. Other fatigue Reports post viral syndrome has effected exercise tolerance and does endorse feeling fatigued.--No recent labs.   Assessment/Plan:   1. Vitamin D deficiency We will obtain labs today.  Continue  over-the-counter vitamin D and will adjust medication as indicated from results of labs.   - VITAMIN D 25 Hydroxy (Vit-D Deficiency, Fractures)  2. Pre-diabetes We will obtain labs today. Resume a prescribed nutrition plan to decrease simple carbohydrates, increase lean protein and exercise to promote weight loss.  - CMP14+EGFR - Hemoglobin A1c - Insulin, random  3. Hypothyroidism, unspecified type Continue Synthroid, follow-up with [facility] endocrinologist as directed.  4. Mixed hyperlipidemia We will obtain labs today.  Resume a prescribed nutrition plan to decrease simple carbohydrates, increase lean protein and exercise to promote weight loss.  - Lipid Panel With LDL/HDL Ratio  5. Other fatigue We will obtain labs today.  - Vitamin B12 - CBC with Differential/Platelet - Folate  6. Obesity with current BMI 33.8 Jeanne Velez is currently in the action stage of change. As such, her goal is to continue with weight loss efforts. She has agreed to keeping a food journal and adhering to recommended goals of 1200-1400 calories and 85+ grams of protein daily.   Exercise goals: As is.  Behavioral modification strategies: increasing lean protein intake, decreasing simple carbohydrates, meal planning and cooking strategies, keeping healthy foods in the home, and keeping a strict food journal.  Jeanne Velez has agreed to follow-up with our clinic in 3 weeks. She was informed of the importance of frequent follow-up visits to maximize her success with intensive lifestyle modifications for her multiple health conditions.   Objective:   Blood pressure 132/83, pulse 83, temperature 98 F (36.7 C), height '5\' 8"'$  (1.727 m), weight 222 lb (100.7 kg), SpO2 100 %.  Body mass index is 33.75 kg/m.  General: Cooperative, alert, well developed, in no acute distress. HEENT: Conjunctivae and lids unremarkable. Cardiovascular: Regular rhythm.  Lungs: Normal work of breathing. Neurologic: No focal deficits.   Lab  Results  Component Value Date   CREATININE 0.80 11/09/2022   BUN 17 11/09/2022   NA 138 11/09/2022   K 4.6 11/09/2022   CL 100 11/09/2022   CO2 24 11/09/2022   Lab Results  Component Value Date   ALT 53 (H) 11/09/2022   AST 37 11/09/2022   ALKPHOS 108 11/09/2022   BILITOT 1.0 11/09/2022   Lab Results  Component Value Date   HGBA1C 5.7 (H) 11/09/2022   HGBA1C 5.6 12/13/2021   HGBA1C 5.7 (H) 07/22/2021   HGBA1C 5.7 (H) 03/30/2021   HGBA1C 5.6 08/11/2020   Lab Results  Component Value Date   INSULIN 10.0 11/09/2022   INSULIN 4.7 07/22/2021   INSULIN 6.7 03/30/2021   Lab Results  Component Value Date   TSH 0.954 03/30/2021   Lab Results  Component Value Date   CHOL 222 (H) 11/09/2022   HDL 57 11/09/2022   LDLCALC 145 (H) 11/09/2022   TRIG 114 11/09/2022   CHOLHDL 4.3 07/22/2021   Lab Results  Component Value Date   VD25OH 36.4 11/09/2022   VD25OH 53.8 12/13/2021   VD25OH 57.1 07/22/2021   Lab Results  Component Value Date   WBC 5.0 11/09/2022   HGB 14.2 11/09/2022   HCT 42.6 11/09/2022   MCV 91 11/09/2022   PLT 234 11/09/2022   No results found for: "IRON", "TIBC", "FERRITIN"  Attestation Statements:   Reviewed by clinician on day of visit: allergies, medications, problem list, medical history, surgical history, family history, social history, and previous encounter notes.  I, Brendell Tyus, am acting as transcriptionist for [employer], [employer].  I have reviewed the above documentation for accuracy and completeness, and I agree with the above. -  Jeanne Wiederhold,PA-C

## 2022-11-24 ENCOUNTER — Other Ambulatory Visit: Payer: Self-pay | Admitting: Obstetrics and Gynecology

## 2022-11-24 ENCOUNTER — Telehealth: Payer: Self-pay

## 2022-11-24 DIAGNOSIS — N951 Menopausal and female climacteric states: Secondary | ICD-10-CM

## 2022-11-24 DIAGNOSIS — Z79890 Hormone replacement therapy (postmenopausal): Secondary | ICD-10-CM

## 2022-11-24 NOTE — Telephone Encounter (Signed)
She does not have to stop it before, but I will recommend avoiding it for 3-4 weeks after her treatment until her skin is healed. Thank you

## 2022-11-24 NOTE — Telephone Encounter (Signed)
Patient advised of information per Dr. Moye. aw 

## 2022-11-24 NOTE — Telephone Encounter (Signed)
Patient called and asked should she d/c tretinoin before procedure next week to treat hyperplasia?

## 2022-11-29 ENCOUNTER — Ambulatory Visit (INDEPENDENT_AMBULATORY_CARE_PROVIDER_SITE_OTHER): Payer: 59 | Admitting: Physician Assistant

## 2022-11-29 ENCOUNTER — Encounter (INDEPENDENT_AMBULATORY_CARE_PROVIDER_SITE_OTHER): Payer: Self-pay | Admitting: Physician Assistant

## 2022-11-29 VITALS — BP 139/83 | HR 80 | Temp 98.6°F | Ht 68.0 in | Wt 219.0 lb

## 2022-11-29 DIAGNOSIS — R7401 Elevation of levels of liver transaminase levels: Secondary | ICD-10-CM

## 2022-11-29 DIAGNOSIS — R7303 Prediabetes: Secondary | ICD-10-CM | POA: Diagnosis not present

## 2022-11-29 DIAGNOSIS — R0602 Shortness of breath: Secondary | ICD-10-CM

## 2022-11-29 DIAGNOSIS — E782 Mixed hyperlipidemia: Secondary | ICD-10-CM

## 2022-11-29 DIAGNOSIS — E559 Vitamin D deficiency, unspecified: Secondary | ICD-10-CM | POA: Diagnosis not present

## 2022-11-29 DIAGNOSIS — Z6833 Body mass index (BMI) 33.0-33.9, adult: Secondary | ICD-10-CM

## 2022-11-29 DIAGNOSIS — E669 Obesity, unspecified: Secondary | ICD-10-CM

## 2022-11-29 MED ORDER — VITAMIN D (ERGOCALCIFEROL) 1.25 MG (50000 UNIT) PO CAPS: 50000.0000 [IU] | ORAL_CAPSULE | ORAL | 0 refills | Status: DC

## 2022-11-30 ENCOUNTER — Ambulatory Visit (INDEPENDENT_AMBULATORY_CARE_PROVIDER_SITE_OTHER): Payer: PRIVATE HEALTH INSURANCE | Admitting: Dermatology

## 2022-11-30 DIAGNOSIS — D2239 Melanocytic nevi of other parts of face: Secondary | ICD-10-CM

## 2022-11-30 DIAGNOSIS — D492 Neoplasm of unspecified behavior of bone, soft tissue, and skin: Secondary | ICD-10-CM

## 2022-11-30 DIAGNOSIS — L738 Other specified follicular disorders: Secondary | ICD-10-CM

## 2022-11-30 NOTE — Patient Instructions (Signed)
Wound Care Instructions  Cleanse wound gently with soap and water once a day then pat dry with clean gauze. Apply a thin coat of Petrolatum (petroleum jelly, "Vaseline") over the wound (unless you have an allergy to this). We recommend that you use a new, sterile tube of Vaseline. Do not pick or remove scabs. Do not remove the yellow or white "healing tissue" from the base of the wound.  Cover the wound with fresh, clean, nonstick gauze and secure with paper tape. You may use Band-Aids in place of gauze and tape if the wound is small enough, but would recommend trimming much of the tape off as there is often too much. Sometimes Band-Aids can irritate the skin.  You should call the office for your biopsy report after 1 week if you have not already been contacted.  If you experience any problems, such as abnormal amounts of bleeding, swelling, significant bruising, significant pain, or evidence of infection, please call the office immediately.  FOR ADULT SURGERY PATIENTS: If you need something for pain relief you may take 1 extra strength Tylenol (acetaminophen) AND 2 Ibuprofen (200mg each) together every 4 hours as needed for pain. (do not take these if you are allergic to them or if you have a reason you should not take them.) Typically, you may only need pain medication for 1 to 3 days.     Due to recent changes in healthcare laws, you may see results of your pathology and/or laboratory studies on MyChart before the doctors have had a chance to review them. We understand that in some cases there may be results that are confusing or concerning to you. Please understand that not all results are received at the same time and often the doctors may need to interpret multiple results in order to provide you with the best plan of care or course of treatment. Therefore, we ask that you please give us 2 business days to thoroughly review all your results before contacting the office for clarification. Should  we see a critical lab result, you will be contacted sooner.   If You Need Anything After Your Visit  If you have any questions or concerns for your doctor, please call our main line at [phone] and press option 4 to reach your doctor's medical assistant. If no one answers, please leave a voicemail as directed and we will return your call as soon as possible. Messages left after 4 pm will be answered the following business day.   You may also send us a message via MyChart. We typically respond to MyChart messages within 1-2 business days.  For prescription refills, please ask your pharmacy to contact our office. Our fax number is [phone].  If you have an urgent issue when the clinic is closed that cannot wait until the next business day, you can page your doctor at the number below.    Please note that while we do our best to be available for urgent issues outside of office hours, we are not available 24/7.   If you have an urgent issue and are unable to reach us, you may choose to seek medical care at your doctor's office, retail clinic, urgent care center, or emergency room.  If you have a medical emergency, please immediately call 911 or go to the emergency department.  Pager Numbers  - Dr. Kowalski: [phone]  - Dr. Moye: [phone]  - Dr. Stewart: [phone]  In the event of inclement weather, please call our main line at   [phone] for an update on the status of any delays or closures.  Dermatology Medication Tips: Please keep the boxes that topical medications come in in order to help keep track of the instructions about where and how to use these. Pharmacies typically print the medication instructions only on the boxes and not directly on the medication tubes.   If your medication is too expensive, please contact our office at [phone] option 4 or send us a message through MyChart.   We are unable to tell what your co-pay for medications will be in  advance as this is different depending on your insurance coverage. However, we may be able to find a substitute medication at lower cost or fill out paperwork to get insurance to cover a needed medication.   If a prior authorization is required to get your medication covered by your insurance company, please allow us 1-2 business days to complete this process.  Drug prices often vary depending on where the prescription is filled and some pharmacies may offer cheaper prices.  The website www.goodrx.com contains coupons for medications through different pharmacies. The prices here do not account for what the cost may be with help from insurance (it may be cheaper with your insurance), but the website can give you the price if you did not use any insurance.  - You can print the associated coupon and take it with your prescription to the pharmacy.  - You may also stop by our office during regular business hours and pick up a GoodRx coupon card.  - If you need your prescription sent electronically to a different pharmacy, notify our office through [facility] MyChart or by phone at [phone] option 4.     Si Usted Necesita Algo Despus de Su Visita  Tambin puede enviarnos un mensaje a travs de MyChart. Por lo general respondemos a los mensajes de MyChart en el transcurso de 1 a 2 das hbiles.  Para renovar recetas, por favor pida a su farmacia que se ponga en contacto con nuestra oficina. Nuestro nmero de fax es el [phone].  Si tiene un asunto urgente cuando la clnica est cerrada y que no puede esperar hasta el siguiente da hbil, puede llamar/localizar a su doctor(a) al nmero que aparece a continuacin.   Por favor, tenga en cuenta que aunque hacemos todo lo posible para estar disponibles para asuntos urgentes fuera del horario de oficina, no estamos disponibles las 24 horas del da, los 7 das de la semana.   Si tiene un problema urgente y no puede comunicarse con nosotros, puede  optar por buscar atencin mdica  en el consultorio de su doctor(a), en una clnica privada, en un centro de atencin urgente o en una sala de emergencias.  Si tiene una emergencia mdica, por favor llame inmediatamente al 911 o vaya a la sala de emergencias.  Nmeros de bper  - Dr. Kowalski: [phone]  - Dra. Moye: [phone]  - Dra. Stewart: [phone]  En caso de inclemencias del tiempo, por favor llame a nuestra lnea principal al [phone] para una actualizacin sobre el estado de cualquier retraso o cierre.  Consejos para la medicacin en dermatologa: Por favor, guarde las cajas en las que vienen los medicamentos de uso tpico para ayudarle a seguir las instrucciones sobre dnde y cmo usarlos. Las farmacias generalmente imprimen las instrucciones del medicamento slo en las cajas y no directamente en los tubos del medicamento.   Si su medicamento es muy caro, por favor, pngase en contacto con   nuestra oficina llamando al [phone] y presione la opcin 4 o envenos un mensaje a travs de MyChart.   No podemos decirle cul ser su copago por los medicamentos por adelantado ya que esto es diferente dependiendo de la cobertura de su seguro. Sin embargo, es posible que podamos encontrar un medicamento sustituto a menor costo o llenar un formulario para que el seguro cubra el medicamento que se considera necesario.   Si se requiere una autorizacin previa para que su compaa de seguros cubra su medicamento, por favor permtanos de 1 a 2 das hbiles para completar este proceso.  Los precios de los medicamentos varan con frecuencia dependiendo del lugar de dnde se surte la receta y alguna farmacias pueden ofrecer precios ms baratos.  El sitio web www.goodrx.com tiene cupones para medicamentos de diferentes farmacias. Los precios aqu no tienen en cuenta lo que podra costar con la ayuda del seguro (puede ser ms barato con su seguro), pero el sitio web puede darle el  precio si no utiliz ningn seguro.  - Puede imprimir el cupn correspondiente y llevarlo con su receta a la farmacia.  - Tambin puede pasar por nuestra oficina durante el horario de atencin regular y recoger una tarjeta de cupones de GoodRx.  - Si necesita que su receta se enve electrnicamente a una farmacia diferente, informe a nuestra oficina a travs de MyChart de [place] o por telfono llamando al [phone] y presione la opcin 4.  

## 2022-11-30 NOTE — Progress Notes (Signed)
   Follow-Up Visit   Subjective  Jeanne Velez is a 62 y.o. 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 ).  The following portions of the chart were reviewed this encounter and updated as appropriate:   Tobacco  Allergies  Meds  Problems  Med Hx  Surg Hx  Fam Hx      Review of Systems:  No other skin or systemic complaints except as noted in HPI or Assessment and Plan.  Objective  Well appearing patient in no apparent distress; mood and affect are within normal limits.  A focused examination was performed including face, neck. Relevant physical exam findings are noted in the Assessment and Plan.  face Small yellow papules with a central dell.            left nasal sidewall 0.2 cm to 0.4 cm cluster of  yellow papules, 1 sampled       Assessment & Plan  Sebaceous hyperplasia face  30 minutes prior to treatment with ED, topical numbing (lidocaine 23%/tetracaine 7%) applied to entire face and upper neck.  Larger spots injected with lido 1% w/epi.  Patient advised she can continue her hypochlorous spray but suggest waiting a few days before using clindamycin as it will burn  Patient voiced understanding treatment is cosmetic.   Reviewed risk of scarring, dyspigmentation, wound and incomplete removal prior to treatment.   Destruction of lesion - face  Destruction method: electrodesiccation and curettage   Destruction method comment:  Electrocautery only Informed consent: discussed and consent obtained   Timeout:  patient name, date of birth, surgical site, and procedure verified Anesthesia: the lesion was anesthetized in a standard fashion   Anesthetic:  Topical anesthetic Hemostasis achieved with:  electrodesiccation Outcome: patient tolerated procedure well with no complications   Post-procedure details: sterile dressing applied and wound care instructions given   Dressing type: petrolatum     Neoplasm of skin left nasal sidewall  Skin / nail biopsy Type of biopsy: tangential   Informed consent: discussed and consent obtained   Timeout: patient name, date of birth, surgical site, and procedure verified   Procedure prep:  Patient was prepped and draped in usual sterile fashion Prep type:  Isopropyl alcohol Anesthesia: the lesion was anesthetized in a standard fashion   Anesthetic:  1% lidocaine w/ epinephrine 1-100,000 buffered w/ 8.4% NaHCO3 Instrument used: DermaBlade   Hemostasis achieved with: aluminum chloride   Outcome: patient tolerated procedure well   Post-procedure details: wound care instructions given   Additional details:  Petrolatum and a pressure bandage applied  Specimen 1 - Surgical pathology Differential Diagnosis: r/o Sebaceous Hyperplasia vs Sebaceous Adenoma > BCC  Check Margins: No 0.2 cm to 0.4 cm cluster of  yellow papules, 1 sampled, small   Return for as scheduled.  Graciella Belton, RMA, am acting as scribe for Forest Gleason, MD .  Documentation: I have reviewed the above documentation for accuracy and completeness, and I agree with the above.  Forest Gleason, MD

## 2022-12-01 ENCOUNTER — Encounter: Payer: Self-pay | Admitting: Dermatology

## 2022-12-06 ENCOUNTER — Telehealth: Payer: Self-pay

## 2022-12-06 ENCOUNTER — Other Ambulatory Visit: Payer: Self-pay

## 2022-12-06 ENCOUNTER — Telehealth: Payer: Self-pay | Admitting: Gastroenterology

## 2022-12-06 DIAGNOSIS — Z86010 Personal history of colonic polyps: Secondary | ICD-10-CM

## 2022-12-06 MED ORDER — SUTAB 1479-225-188 MG PO TABS: ORAL_TABLET | ORAL | 0 refills | Status: DC

## 2022-12-06 NOTE — Telephone Encounter (Signed)
Discussed pathology results. Patient voiced understanding.  

## 2022-12-06 NOTE — Progress Notes (Unsigned)
Chief Complaint:   OBESITY Jeanne Velez is here to discuss her progress with her obesity treatment plan along with follow-up of her obesity related diagnoses. Jeanne Velez is on keeping a food journal and adhering to recommended goals of 1200-1400 calories and 85+ grams of protein and states she is following her eating plan approximately 61% of the time. Jeanne Velez states she is walking/weights/[employer] 30/30/60 minutes 2-4 times per week.  Today's visit was #: 57 Starting weight: 220 lbs Starting date: 03/30/2021 Today's weight: 219 lbs Today's date: 11/29/2022 Total lbs lost to date: 1 lb Total lbs lost since last in-office visit: 3  Interim History: Jeanne Velez has done well with weight loss over the past several weeks.  She reports she is journaling well.  Calories- usually 1200-1400, occasionally to 1500-1700.  Protein- 70% of the time is 85 grams or more/ 17% of the time 80-84 grams and 2 days was only 77 grams.  Reports has been able to increase her exercise as now feels she has the endurance she did not have over the past several months following a prolonged URI.   Subjective:   1. SOB (shortness of breath) on exertion Today's RMR is 1498-previous RMR 1798 on 03/30/21.  This is 300 calories lower than previous level and is lower then predicted RMR of 1650.   2. Vitamin D deficiency Labs discussed during visit today.  Jeanne Velez has not taking ergocalciferol in many months.  She is taking over the counter daily.  Level of 36.4-Not at goal of 50-70. She has not had side effects with Ergocalciferol in the past or with OTC vitamin D. We will resume ergocalciferol once a week.  3. Mixed hyperlipidemia LDL of 145- Not at goal of < 100/HDL of 57- at goal/ Trig of 114- at goal.  LDL is elevated and worsening. Not on medication.  She has been working on decreasing simple carbs, decreasing saturated fats and cholesterol and exercise to promote weight loss/improve lipids.  4. Pre-diabetes A1c at 5.7/ insulin at  10--not at goal.  Not on medications.  She has been working on decreasing simple carbs, increasing lean protein and exercise to promote weight loss/improve glycemic control and insulin resistance.  5. Elevated ALT measurement ALT of 53- no history of previous elevations, rest of LFTs are normal.  No symptoms.  Likely related to fatty liver disease, but also recent prolonged URI with multiple medications.  Assessment/Plan:   1. SOB (shortness of breath) on exertion We discussed the need to continue to have a caloric deficit to continue to promote weight loss. We discussed continuing to keep calories around 1200 and protein 85+ grams daily to promote weight loss.  We discussed continuing to work on exercise, specifically strengthening to increase RMR.  2. Vitamin D deficiency We will refill Vit D 50K IU once a week for 1 month with 0 refills.   Resume Ergocalciferol once weekly. Will recheck level 2-3 times a year to avoid over supplementation.  -Refill Vitamin D, Ergocalciferol, (DRISDOL) 1.25 MG (50000 UNIT) CAPS capsule; Take 1 capsule (50,000 Units total) by mouth every 7 (seven) days.  Dispense: 12 capsule; Refill: 0  3. Mixed hyperlipidemia Continue Prescribed Nutrition Plan and exercise to promote weight loss and improve lipid panel.  Will follow up labs in 3-4 months.  4. Pre-diabetes Continue Prescribed Nutrition Plan and exercise to promote weight loss and improve glycemic control and insulin resistance. Recheck Labs in 3-4 months.  5. Elevated ALT measurement Continue Prescribed Nutrition Plan and  exercise to promote weight loss .  Recheck Labs in 3-4 months .  6. Obesity with current BMI 33.4 Jeanne Velez is currently in the action stage of change. As such, her goal is to continue with weight loss efforts. She has agreed to keeping a food journal and adhering to recommended goals of 1200 calories and 85+ grams of protein daily.   Exercise goals: As is.  Behavioral modification  strategies: increasing lean protein intake, decreasing simple carbohydrates, and keeping a strict food journal.  Jeanne Velez has agreed to follow-up with our clinic in 3 weeks. She was informed of the importance of frequent follow-up visits to maximize her success with intensive lifestyle modifications for her multiple health conditions.   Objective:   Blood pressure 139/83, pulse 80, temperature 98.6 F (37 C), height '5\' 8"'$  (1.727 m), weight 219 lb (99.3 kg), SpO2 100 %. Body mass index is 33.3 kg/m.  General: Cooperative, alert, well developed, in no acute distress. HEENT: Conjunctivae and lids unremarkable. Cardiovascular: Regular rhythm.  Lungs: Normal work of breathing. Neurologic: No focal deficits.   Lab Results  Component Value Date   CREATININE 0.80 11/09/2022   BUN 17 11/09/2022   NA 138 11/09/2022   K 4.6 11/09/2022   CL 100 11/09/2022   CO2 24 11/09/2022   Lab Results  Component Value Date   ALT 53 (H) 11/09/2022   AST 37 11/09/2022   ALKPHOS 108 11/09/2022   BILITOT 1.0 11/09/2022   Lab Results  Component Value Date   HGBA1C 5.7 (H) 11/09/2022   HGBA1C 5.6 12/13/2021   HGBA1C 5.7 (H) 07/22/2021   HGBA1C 5.7 (H) 03/30/2021   HGBA1C 5.6 08/11/2020   Lab Results  Component Value Date   INSULIN 10.0 11/09/2022   INSULIN 4.7 07/22/2021   INSULIN 6.7 03/30/2021   Lab Results  Component Value Date   TSH 0.954 03/30/2021   Lab Results  Component Value Date   CHOL 222 (H) 11/09/2022   HDL 57 11/09/2022   LDLCALC 145 (H) 11/09/2022   TRIG 114 11/09/2022   CHOLHDL 4.3 07/22/2021   Lab Results  Component Value Date   VD25OH 36.4 11/09/2022   VD25OH 53.8 12/13/2021   VD25OH 57.1 07/22/2021   Lab Results  Component Value Date   WBC 5.0 11/09/2022   HGB 14.2 11/09/2022   HCT 42.6 11/09/2022   MCV 91 11/09/2022   PLT 234 11/09/2022   No results found for: "IRON", "TIBC", "FERRITIN"  Attestation Statements:   Reviewed by clinician on day of visit:  allergies, medications, problem list, medical history, surgical history, family history, social history, and previous encounter notes.  I, Brendell Tyus, am acting as transcriptionist for [employer], [employer].  I have reviewed the above documentation for accuracy and completeness, and I agree with the above. -  Fatumata Kashani,PA-C

## 2022-12-06 NOTE — Telephone Encounter (Signed)
-----  Message from Alfonso Patten, MD sent at 12/06/2022  3:21 PM EST ----- Skin , left nasal sidewall MELANOCYTIC NEVUS, INTRADERMAL TYPE, BASE INVOLVED   This is a NORMAL MOLE. No additional treatment is needed. If you notice any new or changing spots or have other skin concerns in future, please call our office at [phone].     MAs please call. Thank you!

## 2022-12-06 NOTE — Telephone Encounter (Signed)
Gastroenterology Pre-Procedure Rev Request Date: 12/14/22 Requesting Physician: Dr. Marius Ditch  PATIENT REVIEW QUESTIONS: The patient responded to the following health history questions as indicated:    1. Are you having any GI issues? no 2. Do you have a personal history of Polyps? yes (patient stated she has had polyps in the past date unknown) 3. Do you have a family history of Colon Cancer or Polyps? no 4. Diabetes Mellitus? no 5. Joint replacements in the past 12 months?no 6. Major health problems in the past 3 months?no 7. Any artificial heart valves, MVP, or defibrillator?no    MEDICATIONS & ALLERGIES:    Patient reports the following regarding taking any anticoagulation/antiplatelet therapy:   Plavix, Coumadin, Eliquis, Xarelto, Lovenox, Pradaxa, Brilinta, or Effient? no Aspirin? no  Patient confirms/reports the following medications:  Current Outpatient Medications  Medication Sig Dispense Refill   acetaminophen (TYLENOL) 325 MG tablet Take 650 mg by mouth every 6 (six) hours as needed.     albuterol (VENTOLIN HFA) 108 (90 Base) MCG/ACT inhaler Inhale 2 puffs into the lungs every 6 (six) hours as needed for wheezing or shortness of breath. 8 g 2   calcium-vitamin D (OSCAL WITH D) 500-200 MG-UNIT tablet Take 1 tablet by mouth.     clindamycin (CLINDAGEL) 1 % gel Apply 1 Application topically daily. 30 g 5   doxycycline (ORACEA) 40 MG capsule Take one tablet by mouth daily with food. Avoid taking at the same time as dairy or supplements. Do not lay down for 30 minutes after taking. Be cautious with sun exposure and use good sun protection while on this medication. 30 capsule 6   estradiol (ESTRACE) 0.5 MG tablet Take 1 tablet (0.5 mg total) by mouth daily. 30 tablet 0   fluticasone (FLONASE) 50 MCG/ACT nasal spray Place 2 sprays into both nostrils daily. Use for 4-6 weeks then stop and use seasonally or as needed. 16 g 3   levothyroxine (SYNTHROID) 125 MCG tablet Take 125 mcg by mouth  daily before breakfast.     loratadine (CLARITIN) 10 MG tablet Take 10 mg by mouth daily.     Multiple Vitamins-Minerals (PRESERVISION AREDS 2 PO) Take 1 capsule by mouth 2 (two) times daily.      NON FORMULARY Hypochlorous spray on face and back 2x daily     Probiotic Product (PROBIOTIC-10 PO) Take by mouth.     tretinoin (RETIN-A) 0.05 % cream Apply topically at bedtime. 45 g 1   Vitamin D, Ergocalciferol, (DRISDOL) 1.25 MG (50000 UNIT) CAPS capsule Take 1 capsule (50,000 Units total) by mouth every 7 (seven) days. 12 capsule 0   No current facility-administered medications for this visit.    Patient confirms/reports the following allergies:  Allergies  Allergen Reactions   Ginger Swelling    Tongue/Face   Penicillin G Swelling    Tongue/Face    Penicillins Swelling    Tongue/Face/Throat   Naproxen    Adhesive [Tape] Itching   Cat Hair Extract Itching   Latex Itching   Wound Dressing Adhesive Itching and Swelling    No orders of the defined types were placed in this encounter.   AUTHORIZATION INFORMATION Primary Insurance: 1D#: Group #:  Secondary Insurance: 1D#: Group #:  SCHEDULE INFORMATION: Date: 12/14/22 Time: Location: [facility]

## 2022-12-06 NOTE — Telephone Encounter (Signed)
Patient calling to schedule repeat colonoscopy.  

## 2022-12-06 NOTE — Addendum Note (Signed)
Addended by: Vanetta Mulders on: 12/06/2022 02:44 PM   Modules accepted: Orders

## 2022-12-06 NOTE — Telephone Encounter (Signed)
-----  Message from Alfonso Patten, MD sent at 12/06/2022  3:21 PM EST ----- Skin , left nasal sidewall MELANOCYTIC NEVUS, INTRADERMAL TYPE, BASE INVOLVED   This is a NORMAL MOLE. No additional treatment is needed. If you notice any new or changing spots or have other skin concerns in future, please call our office at [phone].     MAs please call. Thank you!

## 2022-12-13 ENCOUNTER — Encounter: Payer: Self-pay | Admitting: Gastroenterology

## 2022-12-14 ENCOUNTER — Ambulatory Visit
Admission: RE | Admit: 2022-12-14 | Discharge: 2022-12-14 | Disposition: A | Discharge status: Home / Self Care | Payer: 59 | Attending: Gastroenterology | Admitting: Gastroenterology

## 2022-12-14 ENCOUNTER — Ambulatory Visit: Payer: 59 | Admitting: Anesthesiology

## 2022-12-14 ENCOUNTER — Encounter
Admission: RE | Disposition: A | Discharge status: Home / Self Care | Payer: Self-pay | Source: Home / Self Care | Attending: Gastroenterology

## 2022-12-14 DIAGNOSIS — K621 Rectal polyp: Secondary | ICD-10-CM | POA: Diagnosis not present

## 2022-12-14 DIAGNOSIS — E669 Obesity, unspecified: Secondary | ICD-10-CM | POA: Diagnosis not present

## 2022-12-14 DIAGNOSIS — D128 Benign neoplasm of rectum: Secondary | ICD-10-CM | POA: Insufficient documentation

## 2022-12-14 DIAGNOSIS — Z86010 Personal history of colonic polyps: Secondary | ICD-10-CM | POA: Diagnosis not present

## 2022-12-14 DIAGNOSIS — M1990 Unspecified osteoarthritis, unspecified site: Secondary | ICD-10-CM | POA: Diagnosis not present

## 2022-12-14 DIAGNOSIS — K635 Polyp of colon: Secondary | ICD-10-CM | POA: Insufficient documentation

## 2022-12-14 DIAGNOSIS — E039 Hypothyroidism, unspecified: Secondary | ICD-10-CM | POA: Diagnosis not present

## 2022-12-14 DIAGNOSIS — Z6833 Body mass index (BMI) 33.0-33.9, adult: Secondary | ICD-10-CM | POA: Insufficient documentation

## 2022-12-14 DIAGNOSIS — Z1211 Encounter for screening for malignant neoplasm of colon: Secondary | ICD-10-CM | POA: Insufficient documentation

## 2022-12-14 HISTORY — PX: COLONOSCOPY WITH PROPOFOL: SHX5780

## 2022-12-14 SURGERY — COLONOSCOPY WITH PROPOFOL
Anesthesia: General

## 2022-12-14 MED ORDER — LIDOCAINE HCL (CARDIAC) PF 100 MG/5ML IV SOSY
PREFILLED_SYRINGE | INTRAVENOUS | Status: DC | PRN
  Administered 2022-12-14: 100 mg via INTRAVENOUS

## 2022-12-14 MED ORDER — SODIUM CHLORIDE 0.9 % IV SOLN: INTRAVENOUS | Status: DC

## 2022-12-14 MED ORDER — PROPOFOL 10 MG/ML IV BOLUS
INTRAVENOUS | Status: AC
  Filled 2022-12-14: qty 40

## 2022-12-14 MED ORDER — PROPOFOL 10 MG/ML IV BOLUS
INTRAVENOUS | Status: DC | PRN
  Administered 2022-12-14: 120 mg via INTRAVENOUS
  Administered 2022-12-14: 100 ug/kg/min via INTRAVENOUS

## 2022-12-14 MED ORDER — LIDOCAINE HCL (PF) 2 % IJ SOLN
INTRAMUSCULAR | Status: AC
  Filled 2022-12-14: qty 5

## 2022-12-14 NOTE — Anesthesia Postprocedure Evaluation (Signed)
Anesthesia Post Note  Patient: Jeanne Velez  Procedure(s) Performed: COLONOSCOPY WITH PROPOFOL  Patient location during evaluation: Endoscopy Anesthesia Type: General Level of consciousness: awake and alert Pain management: pain level controlled Vital Signs Assessment: post-procedure vital signs reviewed and stable Respiratory status: spontaneous breathing, nonlabored ventilation and respiratory function stable Cardiovascular status: blood pressure returned to baseline and stable Postop Assessment: no apparent nausea or vomiting Anesthetic complications: no   No notable events documented.   Last Vitals:  Vitals:   12/14/22 0832 12/14/22 0842  BP: 114/80 123/67  Pulse: 74 74  Resp: 13   Temp:    SpO2: 100% 100%    Last Pain:  Vitals:   12/14/22 0842  TempSrc:   PainSc: 0-No pain                 [place] Ouch

## 2022-12-14 NOTE — Transfer of Care (Signed)
Immediate Anesthesia Transfer of Care Note  Patient: Jeanne Velez  Procedure(s) Performed: COLONOSCOPY WITH PROPOFOL  Patient Location: PACU  Anesthesia Type:General  Level of Consciousness: awake  Airway & Oxygen Therapy: Patient Spontanous Breathing  Post-op Assessment: Report given to RN and Post -op Vital signs reviewed and stable  Post vital signs: Reviewed and stable  Last Vitals:  Vitals Value Taken Time  BP 107/62 12/14/22 0822  Temp 35.9 0822  Pulse 100 12/14/22 0822  Resp 16 12/14/22 0822  SpO2 98 % 12/14/22 0822    Last Pain:  Vitals:   12/14/22 0822  TempSrc:   PainSc: Asleep         Complications: No notable events documented.

## 2022-12-14 NOTE — Anesthesia Preprocedure Evaluation (Addendum)
Anesthesia Evaluation  Patient identified by MRN, date of birth, ID band Patient awake    Reviewed: Allergy & Precautions, NPO status , Patient's Chart, lab work & pertinent test results  Airway Mallampati: II  TM Distance: >3 FB Neck ROM: Full    Dental no notable dental hx.    Pulmonary neg pulmonary ROS   Pulmonary exam normal        Cardiovascular Exercise Tolerance: Good Normal cardiovascular exam     Neuro/Psych negative neurological ROS  negative psych ROS   GI/Hepatic Neg liver ROS,neg GERD  ,,  Endo/Other  Hypothyroidism    Renal/GU negative Renal ROS  negative genitourinary   Musculoskeletal  (+) Arthritis , Osteoarthritis,    Abdominal  (+) + obese  Peds negative pediatric ROS (+)  Hematology  (+) Blood dyscrasia, anemia   Anesthesia Other Findings Acne    Allergy    Anemia    Back pain    BRCA negative 07/2020 MyRisk neg  Cataracts, bilateral    Colon polyp    Constipation    Family history of breast cancer 07/2020 AJOI=78.6%/VEHMCNOBS=9.6%  Folliculitis    GERD (gastroesophageal reflux disease)    Hyperlipidemia    Hypothyroidism    Joint pain    Macular degeneration    Osteoarthritis    Retinal vasculitis    Stress    Thyroid cancer (HCC)       Reproductive/Obstetrics negative OB ROS                             Anesthesia Physical Anesthesia Plan  ASA: 2  Anesthesia Plan: General   Post-op Pain Management:    Induction: Intravenous  PONV Risk Score and Plan: 3 and Propofol infusion and TIVA  Airway Management Planned: Natural Airway and Nasal Cannula  Additional Equipment:   Intra-op Plan:   Post-operative Plan:   Informed Consent: I have reviewed the patients History and Physical, chart, labs and discussed the procedure including the risks, benefits and alternatives for the proposed anesthesia with the patient or authorized representative who  has indicated his/her understanding and acceptance.     Dental advisory given  Plan Discussed with: Anesthesiologist  Anesthesia Plan Comments:         Anesthesia Quick Evaluation

## 2022-12-14 NOTE — Op Note (Signed)
[facility] Gastroenterology Patient Name: Jeanne Velez Procedure Date: 12/14/2022 7:08 AM MRN: [id number] Account #: [id number] Date of Birth: December 07, 1960 Admit Type: Outpatient Age: 62 Room: [facility] ENDO ROOM 4 Gender: Female Note Status: Finalized Instrument Name: Jasper Riling [id number] Procedure:             Colonoscopy Indications:           High risk colon cancer surveillance: Personal history                         of colonic polyps, Surveillance: Personal history of                         adenomatous polyps on last colonoscopy > 5 years ago Providers:             Lin Landsman MD, MD Referring MD:          Olin Hauser (Referring MD) Medicines:             General Anesthesia Complications:         No immediate complications. Estimated blood loss: None. Procedure:             Pre-Anesthesia Assessment:                        - Prior to the procedure, a History and Physical was                         performed, and patient medications and allergies were                         reviewed. The patient is competent. The risks and                         benefits of the procedure and the sedation options and                         risks were discussed with the patient. All questions                         were answered and informed consent was obtained.                         Patient identification and proposed procedure were                         verified by the physician, the nurse, the                         anesthesiologist, the anesthetist and the technician                         in the pre-procedure area in the procedure room in the                         endoscopy suite. Mental Status Examination: alert and                         oriented. Airway Examination: normal oropharyngeal  airway and neck mobility. Respiratory Examination:                         clear to auscultation. CV Examination: normal.                          Prophylactic Antibiotics: The patient does not require                         prophylactic antibiotics. Prior Anticoagulants: The                         patient has taken no anticoagulant or antiplatelet                         agents. ASA Grade Assessment: II - A patient with mild                         systemic disease. After reviewing the risks and                         benefits, the patient was deemed in satisfactory                         condition to undergo the procedure. The anesthesia                         plan was to use general anesthesia. Immediately prior                         to administration of medications, the patient was                         re-assessed for adequacy to receive sedatives. The                         heart rate, respiratory rate, oxygen saturations,                         blood pressure, adequacy of pulmonary ventilation, and                         response to care were monitored throughout the                         procedure. The physical status of the patient was                         re-assessed after the procedure.                        After obtaining informed consent, the colonoscope was                         passed under direct vision. Throughout the procedure,                         the patient's blood pressure, pulse, and oxygen  saturations were monitored continuously. The                         Colonoscope was introduced through the anus and                         advanced to the the cecum, identified by appendiceal                         orifice and ileocecal valve. The colonoscopy was                         performed without difficulty. The patient tolerated                         the procedure well. The quality of the bowel                         preparation was evaluated using the BBPS [facility] Bowel                         Preparation Scale) with scores of: Right Colon = 3,                          Transverse Colon = 3 and Left Colon = 3 (entire mucosa                         seen well with no residual staining, small fragments                         of stool or opaque liquid). The total BBPS score                         equals 9. The ileocecal valve, appendiceal orifice,                         and rectum were photographed. Findings:      The perianal and digital rectal examinations were normal. Pertinent       negatives include normal sphincter tone and no palpable rectal lesions.      Two sessile polyps were found in the rectum and ascending colon. The       polyps were 5 mm in size. These polyps were removed with a cold snare.       Resection and retrieval were complete. Estimated blood loss: none.      The retroflexed view of the distal rectum and anal verge was normal and       showed no anal or rectal abnormalities.      The exam was otherwise without abnormality. Impression:            - Two 5 mm polyps in the rectum and in the ascending                         colon, removed with a cold snare. Resected and                         retrieved.                        -  The distal rectum and anal verge are normal on                         retroflexion view.                        - The examination was otherwise normal. Recommendation:        - Discharge patient to home (with escort).                        - Resume previous diet today.                        - Continue present medications.                        - Await pathology results.                        - Repeat colonoscopy in 5 years for surveillance. Procedure Code(s):     --- Professional ---                        [phone], Colonoscopy, flexible; with removal of                         tumor(s), polyp(s), or other lesion(s) by snare                         technique Diagnosis Code(s):     --- Professional ---                        D12.8, Benign neoplasm of rectum                        D12.2,  Benign neoplasm of ascending colon                        Z86.010, Personal history of colonic polyps CPT copyright 2022 American Medical Association. All rights reserved. The codes documented in this report are preliminary and upon coder review may  be revised to meet current compliance requirements. Dr. Ulyess Mort Lin Landsman MD, MD 12/14/2022 8:20:35 AM This report has been signed electronically. Number of Addenda: 0 Note Initiated On: 12/14/2022 7:08 AM Scope Withdrawal Time: 0 hours 10 minutes 54 seconds  Total Procedure Duration: 0 hours 14 minutes 33 seconds  Estimated Blood Loss:  Estimated blood loss: none.      [facility]

## 2022-12-14 NOTE — H&P (Signed)
Cephas Darby, MD [address]  [place]  [place], [place] [address]  Main: [phone]  Fax: [phone] Pager: [phone]  Primary Care Physician:  Olin Hauser, DO Primary Gastroenterologist:  Dr. Cephas Darby  Pre-Procedure History & Physical: HPI:  Jeanne Velez is a 62 y.o. female is here for an colonoscopy.   Past Medical History:  Diagnosis Date   Acne    Allergy    Anemia    Back pain    BRCA negative 07/2020   MyRisk neg   Cataracts, bilateral    Colon polyp    Constipation    Family history of breast cancer 07/2020   IBIS=15.1%/riskscore=6.7%   Family history of skin cancer 8/33/8250   Folliculitis    GERD (gastroesophageal reflux disease)    Hyperlipidemia    Hypothyroidism    Joint pain    Macular degeneration    Osteoarthritis    Retinal vasculitis    Stress    Thyroid cancer ([place])     Past Surgical History:  Procedure Laterality Date   ABDOMINAL HYSTERECTOMY     BREAST BIOPSY Right    yrs ago benign, no marker   FLEXIBLE SIGMOIDOSCOPY N/A 04/28/2021   Procedure: FLEXIBLE SIGMOIDOSCOPY;  Surgeon: Lin Landsman, MD;  Location: [facility] ENDOSCOPY;  Service: Gastroenterology;  Laterality: N/A;   ovarial cystectomy     THYROIDECTOMY     TOTAL ABDOMINAL HYSTERECTOMY W/ BILATERAL SALPINGOOPHORECTOMY     dermoids, ovar cysts   TUBAL LIGATION      Prior to Admission medications   Medication Sig Start Date End Date Taking? Authorizing Provider  doxycycline (ORACEA) 40 MG capsule Take one tablet by mouth daily with food. Avoid taking at the same time as dairy or supplements. Do not lay down for 30 minutes after taking. Be cautious with sun exposure and use good sun protection while on this medication. 09/14/22  Yes Moye, [place], MD  levothyroxine (SYNTHROID) 125 MCG tablet Take 125 mcg by mouth daily before breakfast.   Yes [provider]  acetaminophen (TYLENOL) 325 MG tablet Take 650 mg by mouth every 6 (six) hours  as needed.    [provider]  albuterol (VENTOLIN HFA) 108 (90 Base) MCG/ACT inhaler Inhale 2 puffs into the lungs every 6 (six) hours as needed for wheezing or shortness of breath. 06/20/22   Karamalegos, Devonne Doughty, DO  calcium-vitamin D (OSCAL WITH D) 500-200 MG-UNIT tablet Take 1 tablet by mouth.    [provider]  clindamycin (CLINDAGEL) 1 % gel Apply 1 Application topically daily. 05/12/22   Moye, [place], MD  estradiol (ESTRACE) 0.5 MG tablet Take 1 tablet (0.5 mg total) by mouth daily. 53/97/67   Copland, Elmo Putt B, PA-C  fluticasone (FLONASE) 50 MCG/ACT nasal spray Place 2 sprays into both nostrils daily. Use for 4-6 weeks then stop and use seasonally or as needed. 08/10/22   Karamalegos, Devonne Doughty, DO  loratadine (CLARITIN) 10 MG tablet Take 10 mg by mouth daily.    [provider]  Multiple Vitamins-Minerals (PRESERVISION AREDS 2 PO) Take 1 capsule by mouth 2 (two) times daily.     [provider]  NON FORMULARY Hypochlorous spray on face and back 2x daily    [provider]  Probiotic Product (PROBIOTIC-10 PO) Take by mouth.    [provider]  Sodium Sulfate-Mag Sulfate-KCl (SUTAB) [phone] MG TABS Use as directed.  See Colonoscopy Instructions. 12/06/22   Lin Landsman, MD  tretinoin (RETIN-A) 0.05 %  cream Apply topically at bedtime. 06/15/22 06/15/23  Moye, [place], MD  Vitamin D, Ergocalciferol, (DRISDOL) 1.25 MG (50000 UNIT) CAPS capsule Take 1 capsule (50,000 Units total) by mouth every 7 (seven) days. 11/29/22   Rayburn, Neta Mends, PA-C    Allergies as of 12/06/2022 - Review Complete 12/01/2022  Allergen Reaction Noted   Ginger Swelling 11/14/2018   Penicillin g Swelling 11/14/1965   Penicillins Swelling 07/24/2020   Naproxen  04/22/2022   Adhesive [tape] Itching 07/24/2020   Cat hair extract Itching 10/16/2020   Latex Itching 11/14/2004   Wound dressing adhesive Itching and Swelling 11/14/2004    Family  History  Problem Relation Age of Onset   Myasthenia gravis Mother    Heart disease Father    Depression Father    Skin cancer Father        dx 42s; face/scalp   Anxiety disorder Father    Obesity Father    Bone cancer Maternal Aunt 63   Melanoma Maternal Aunt        dx 40s; bottom of foot   Breast cancer Maternal Grandmother        dx unknown age   Skin cancer Maternal Grandmother        dx unknown age   Breast cancer Cousin        dx 73s; paternal female cousin   Melanoma Cousin        dx 54s; paternal female cousin    Social History   Socioeconomic History   Marital status: Married    Spouse name: Not on file   Number of children: Not on file   Years of education: Not on file   Highest education level: Not on file  Occupational History   Occupation: Recreational therapist  Tobacco Use   Smoking status: Never   Smokeless tobacco: Never  Vaping Use   Vaping Use: Never used  Substance and Sexual Activity   Alcohol use: Yes   Drug use: Never   Sexual activity: Yes    Birth control/protection: Surgical    Comment: Hysterectomy  Other Topics Concern   Not on file  Social History Narrative   Not on file   Social Determinants of Health   Financial Resource Strain: Not on file  Food Insecurity: Not on file  Transportation Needs: Not on file  Physical Activity: Not on file  Stress: Not on file  Social Connections: Not on file  Intimate Partner Violence: Not on file    Review of Systems: See HPI, otherwise negative ROS  Physical Exam: BP (!) 140/89   Pulse 95   Temp (!) 97.5 F (36.4 C) (Temporal)   Resp 16   Ht '5\' 8"'$  (1.727 m)   Wt 99.3 kg   SpO2 100%   BMI 33.30 kg/m  General:   Alert,  pleasant and cooperative in NAD Head:  Normocephalic and atraumatic. Neck:  Supple; no masses or thyromegaly. Lungs:  Clear throughout to auscultation.    Heart:  Regular rate and rhythm. Abdomen:  Soft, nontender and nondistended. Normal bowel sounds, without  guarding, and without rebound.   Neurologic:  Alert and  oriented x4;  grossly normal neurologically.  Impression/Plan: Jeanne Velez is here for an colonoscopy to be performed for h/o colon adenomas  Risks, benefits, limitations, and alternatives regarding  colonoscopy have been reviewed with the patient.  Questions have been answered.  All parties agreeable.   Sherri Sear, MD  12/14/2022, 7:53 AM

## 2022-12-15 ENCOUNTER — Encounter: Payer: Self-pay | Admitting: Gastroenterology

## 2022-12-15 LAB — SURGICAL PATHOLOGY

## 2022-12-20 ENCOUNTER — Encounter (INDEPENDENT_AMBULATORY_CARE_PROVIDER_SITE_OTHER): Payer: Self-pay | Admitting: Physician Assistant

## 2022-12-20 ENCOUNTER — Encounter (INDEPENDENT_AMBULATORY_CARE_PROVIDER_SITE_OTHER): Payer: Self-pay

## 2022-12-20 ENCOUNTER — Ambulatory Visit (INDEPENDENT_AMBULATORY_CARE_PROVIDER_SITE_OTHER): Payer: 59 | Admitting: Physician Assistant

## 2022-12-22 NOTE — Telephone Encounter (Signed)
I have followed up with patient in regard.

## 2022-12-26 ENCOUNTER — Ambulatory Visit
Admission: RE | Admit: 2022-12-26 | Discharge: 2022-12-26 | Disposition: A | Discharge status: Home / Self Care | Payer: PRIVATE HEALTH INSURANCE | Attending: Family Medicine | Admitting: Family Medicine

## 2022-12-26 ENCOUNTER — Ambulatory Visit
Admission: RE | Admit: 2022-12-26 | Discharge: 2022-12-26 | Disposition: A | Discharge status: Home / Self Care | Payer: PRIVATE HEALTH INSURANCE | Source: Ambulatory Visit | Attending: Family Medicine | Admitting: Family Medicine

## 2022-12-26 ENCOUNTER — Encounter: Payer: Self-pay | Admitting: Family Medicine

## 2022-12-26 ENCOUNTER — Ambulatory Visit: Payer: 59 | Admitting: Family Medicine

## 2022-12-26 VITALS — BP 138/88 | HR 77 | Ht 68.0 in | Wt 224.2 lb

## 2022-12-26 DIAGNOSIS — M1712 Unilateral primary osteoarthritis, left knee: Secondary | ICD-10-CM | POA: Diagnosis not present

## 2022-12-26 DIAGNOSIS — M25562 Pain in left knee: Secondary | ICD-10-CM | POA: Diagnosis not present

## 2022-12-26 DIAGNOSIS — M25462 Effusion, left knee: Secondary | ICD-10-CM | POA: Diagnosis not present

## 2022-12-26 DIAGNOSIS — S838X2A Sprain of other specified parts of left knee, initial encounter: Secondary | ICD-10-CM

## 2022-12-26 NOTE — Patient Instructions (Addendum)
Thank you for coming to the office today.  X-ray today, stay tuned for results.  Recommend to start taking Tylenol Extra Strength 520m tabs - take 1 to 2 tabs per dose (max 10037m every 6-8 hours for pain (take regularly, don't skip a dose for next 7 days), max 24 hour daily dose is 6 tablets or 300070mIn the future you can repeat the same everyday Tylenol course for 1-2 weeks at a time.  - This is safe to take with anti-inflammatory medicines (Ibuprofen, Advil, Naproxen, Aleve, Meloxicam, Mobic)  Ibuprofen 400-600m34mery 8 hours approximately or 3 times a day with meals can be alternated  Referral to Dr KrasMack Guiseor L knee and meniscus  [facility] ([facility] Address: [id number] [place] [id number]:  9AM-5PM Phone: [phone] sleeve is useful to reduce swelling and improve movement  Knee brace / rigid support for reducing range of motion if painful and if you are very active or on feet for period of time.   Please schedule a Follow-up Appointment to: Return if symptoms worsen or fail to improve.  If you have any other questions or concerns, please feel free to call the office or send a message through [facility] may also schedule an earlier appointment if necessary.  Additionally, you may be receiving a survey about your experience at our office within a few days to 1 week by e-mail or mail. We value your feedback.  [place]ie [place]

## 2022-12-26 NOTE — Progress Notes (Signed)
Subjective:    Patient ID: Jeanne Velez, female    DOB: 09/23/1961, 62 y.o.   MRN: [id number]  Jeanne Velez is a 62 y.o. female presenting on 12/26/2022 for Knee Pain   HPI  Left Knee Pain & Swelling Osteoarthritis DJD Knees Suspected L Meniscus injury  She lost large percentage of her muscle mass in her lower extremities due to period of in  Early January. She has started a 6 week course for [facility], beginners level. She has 1 week remaining. High intensity interval training Last week on Thursday she was doing an exercise with a lift a weighted ball and small jump exercise, she finished the course and did not have a significant problem She admits the next day she felt pain behind her Left knee. She modified her activity and walking, and she then started to have swelling behind Left Knee She has been using ice packs and elevation She admits with some movements or if prolonged sitting and stand up to move again she would feel something deeper within knee dragging across or moving and pain Admits some slight reduced swelling but persistent. She has known history of osteoarthritis in both knees, but this pain feels different.     06/20/2022    3:36 PM 03/11/2022   10:36 AM 09/16/2021    8:30 AM  Depression screen PHQ 2/9  Decreased Interest 0 0 0  Down, Depressed, Hopeless 0 0 0  PHQ - 2 Score 0 0 0  Altered sleeping 0 0 1  Tired, decreased energy 0 0 1  Change in appetite 0 0 1  Feeling bad or failure about yourself  0 0 0  Trouble concentrating 0 0 0  Moving slowly or fidgety/restless 0 0 0  Suicidal thoughts 0 0 0  PHQ-9 Score 0 0 3  Difficult doing work/chores Not difficult at all Not difficult at all Not difficult at all    Social History   Tobacco Use   Smoking status: Never   Smokeless tobacco: Never  Vaping Use   Vaping Use: Never used  Substance Use Topics   Alcohol use: Yes   Drug use: Never    Review of Systems Per HPI unless specifically indicated  above     Objective:    BP 138/88 (BP Location: Left Arm, Cuff Size: Normal)   Pulse 77   Ht 5' 8"$  (1.727 m)   Wt 224 lb 3.2 oz (101.7 kg)   SpO2 99%   BMI 34.09 kg/m   Wt Readings from Last 3 Encounters:  12/26/22 224 lb 3.2 oz (101.7 kg)  12/14/22 219 lb (99.3 kg)  11/29/22 219 lb (99.3 kg)    Physical Exam Vitals and nursing note reviewed.  Constitutional:      General: She is not in acute distress.    Appearance: Normal appearance. She is well-developed. She is not diaphoretic.     Comments: Well-appearing, comfortable, cooperative  HENT:     Head: Normocephalic and atraumatic.  Eyes:     General:        Right eye: No discharge.        Left eye: No discharge.     Conjunctiva/sclera: Conjunctivae normal.  Cardiovascular:     Rate and Rhythm: Normal rate.  Pulmonary:     Effort: Pulmonary effort is normal.  Musculoskeletal:     Comments: Left Knee Inspection: some bulky appearance with effusion today. No ecchymosis Palpation: Mild +TTP Left knee only lateral joint line. No crepitus. ROM:  Full active ROM bilaterally, except pain provoked L knee extension Special Testing: Lachman / Valgus/Varus tests negative with intact ligaments (ACL, MCL, LCL). Standing Thessaly maneuver positive for L knee lateral meniscus Strength: 5/5 intact knee flex/ext, ankle dorsi/plantarflex Neurovascular: distally intact sensation light touch and pulses   Skin:    General: Skin is warm and dry.     Findings: No erythema or rash.  Neurological:     Mental Status: She is alert and oriented to person, place, and time.  Psychiatric:        Mood and Affect: Mood normal.        Behavior: Behavior normal.        Thought Content: Thought content normal.     Comments: Well groomed, good eye contact, normal speech and thoughts       Results for orders placed or performed during the hospital encounter of 12/14/22  Surgical pathology  Result Value Ref Range   SURGICAL PATHOLOGY       SURGICAL PATHOLOGY CASE: [id number] PATIENT: Jeanne Velez Surgical Pathology Report     Specimen Submitted: A. Colon polyp, ascending; cold snare B. Rectum polyp; cold snare  Clinical History: History of colon adenomas.  Colon polyps      DIAGNOSIS: A.  COLON, ASCENDING, POLYP; COLD SNARE BIOPSIES: - SESSILE SERRATED POLYP. - NO EVIDENCE OF HIGH-GRADE DYSPLASIA OR MALIGNANCY.  B.  RECTUM, POLYP; COLD SNARE BIOPSIES: - TUBULAR ADENOMA. - NO EVIDENCE OF HIGH-GRADE DYSPLASIA OR MALIGNANCY.   GROSS DESCRIPTION: A. Labeled: Cold snare ascending colon polyp Received: Formalin Collection time: 8:10 AM on 12/14/2022 Placed into formalin time: 8:10 AM on 12/14/2022 Tissue fragment(s): Multiple Size: Aggregate, 1.8 x 0.5 x 0.1 cm Description: Received are fragments of tan soft tissue admixed with intestinal debris.  The ratio of soft tissue to intestinal debris is 70: 30. Entirely submitted in 1 cassette.  B. Labeled: Cold snare rectum polyp Received: Fo rmalin Collection time: 8:16 AM on 12/14/2022 Placed into formalin time: 8:16 AM on 12/14/2022 Tissue fragment(s): 1 Size: 0.5 x 0.4 x 0.2 cm Description: Tan soft tissue fragment Entirely submitted in 1 cassette.  RB 12/14/2022  Final Diagnosis performed by Theodora Blow, MD.   Electronically signed 12/15/2022 12:18:08PM The electronic signature indicates that the named Attending Pathologist has evaluated the specimen Technical component performed at [facility], [address] [place], [place] [address] Lab: [phone] Dir: Rush Farmer, MD, MMM  Professional component performed at [facility], [facility], [place], [place], [place] [address] Lab: [phone] Dir: Kathi Simpers, MD       Assessment & Plan:   Problem List Items Addressed This Visit   None Visit Diagnoses     Pain and swelling of left knee    -  Primary   Relevant Orders   DG Knee Complete 4 Views Left   Ambulatory  referral to Orthopedic Surgery   Primary osteoarthritis of left knee       Relevant Orders   Ambulatory referral to Orthopedic Surgery   Acute lateral meniscal injury of left knee, initial encounter       Relevant Orders   Ambulatory referral to Orthopedic Surgery       Acute L lateral Knee pain and swelling without known injury or trauma but increased physical activity with boot camp exercise class Recent acute onset Known knee OA/DJD.  Concern for meniscus given symptoms, and given history likely chronic degenerative tear Difficulty weight bearing, some mechanical symptoms - No prior history of  knee surgery, arthroscopy - Inadequate conservative therapy   Plan: 1. Start with X-ray today L Knee, pending result. 2. Tylenol / NSAID AS NEEDED, discussed dosing on AVS 3. RICE therapy (rest, ice, compression, elevation) for swelling, activity modification - Recommend knee compression and also can use immobilization brace for situational use  Referral to Dr Mack Guise - for L knee and meniscus suspected  [facility] Address: [address], [place], [place] [address] Hours:  9AM-5PM Phone: [phone]  May warrant MRI in future.  Orders Placed This Encounter  Procedures   DG Knee Complete 4 Views Left    Standing Status:   Future    Number of Occurrences:   1    Standing Expiration Date:   12/27/2023    Order Specific Question:   Reason for Exam (SYMPTOM  OR DIAGNOSIS REQUIRED)    Answer:   acute on chronic Left knee pain and swelling, no trauma, possible meniscus vs loose body, known arthritis    Order Specific Question:   Is patient pregnant?    Answer:   No    Order Specific Question:   Preferred imaging location?    Answer:   [facility]   Ambulatory referral to Orthopedic Surgery    Referral Priority:   Routine    Referral Type:   Surgical    Referral Reason:   Specialty Services Required    Requested Specialty:   Orthopedic  Surgery    Number of Visits Requested:   1     No orders of the defined types were placed in this encounter.     Follow up plan: Return if symptoms worsen or fail to improve.   Nobie Putnam, [facility] 12/26/2022, 1:46 PM

## 2023-01-05 ENCOUNTER — Ambulatory Visit (INDEPENDENT_AMBULATORY_CARE_PROVIDER_SITE_OTHER): Payer: PRIVATE HEALTH INSURANCE | Admitting: Dermatology

## 2023-01-05 DIAGNOSIS — D2339 Other benign neoplasm of skin of other parts of face: Secondary | ICD-10-CM | POA: Diagnosis not present

## 2023-01-05 DIAGNOSIS — L578 Other skin changes due to chronic exposure to nonionizing radiation: Secondary | ICD-10-CM

## 2023-01-05 DIAGNOSIS — L820 Inflamed seborrheic keratosis: Secondary | ICD-10-CM

## 2023-01-05 DIAGNOSIS — L700 Acne vulgaris: Secondary | ICD-10-CM

## 2023-01-05 DIAGNOSIS — D492 Neoplasm of unspecified behavior of bone, soft tissue, and skin: Secondary | ICD-10-CM

## 2023-01-05 DIAGNOSIS — L814 Other melanin hyperpigmentation: Secondary | ICD-10-CM

## 2023-01-05 DIAGNOSIS — Z1283 Encounter for screening for malignant neoplasm of skin: Secondary | ICD-10-CM | POA: Diagnosis not present

## 2023-01-05 DIAGNOSIS — L738 Other specified follicular disorders: Secondary | ICD-10-CM

## 2023-01-05 DIAGNOSIS — L821 Other seborrheic keratosis: Secondary | ICD-10-CM

## 2023-01-05 DIAGNOSIS — D229 Melanocytic nevi, unspecified: Secondary | ICD-10-CM

## 2023-01-05 DIAGNOSIS — L7211 Pilar cyst: Secondary | ICD-10-CM

## 2023-01-05 NOTE — Progress Notes (Signed)
Follow-Up Visit   Subjective  Jeanne Velez is a 62 y.o. female who presents for the following: FBSE (Fhx skin cancer. Patient does have a spot at left low abdomen that was inflamed but has since gotten better. Also a spot at left axilla.) and Acne Adora Fridge. Patient taking doxycycline 40 mg daily and using tretinoin 0.5% and clindamycin gel daily. Patient feels that acne has greatly improved since having sebaceous hyperplasia treated. ).  The patient presents for Total-Body Skin Exam (TBSE) for skin cancer screening and mole check.  The patient has spots, moles and lesions to be evaluated, some may be new or changing and the patient has concerns that these could be cancer.   The following portions of the chart were reviewed this encounter and updated as appropriate:   Tobacco  Allergies  Meds  Problems  Med Hx  Surg Hx  Fam Hx      Review of Systems:  No other skin or systemic complaints except as noted in HPI or Assessment and Plan.  Objective  Well appearing patient in no apparent distress; mood and affect are within normal limits.  A full examination was performed including scalp, head, eyes, ears, nose, lips, neck, chest, axillae, abdomen, back, buttocks, bilateral upper extremities, bilateral lower extremities, hands, feet, fingers, toes, fingernails, and toenails. All findings within normal limits unless otherwise noted below.  left low abdomen Erythematous stuck-on, waxy papule or plaque  face Mid-face erythema with rare inflammatory papule  mid parietal scalp Subcutaneous nodule at scalp.   face Small yellow papules with a central dell.   Left Lateral Chin 0.3 cm pink papule       Assessment & Plan  Inflamed seborrheic keratosis left low abdomen  Benign-appearing.  Observation.  Call clinic for new or changing lesions.    Patient defers treatment   Acne vulgaris face  /Rosacea  Chronic condition with duration or expected duration over one year.  Currently well-controlled.  Rosacea is a chronic progressive skin condition usually affecting the face of adults, causing redness and/or acne bumps. It is treatable but not curable. It sometimes affects the eyes (ocular rosacea) as well. It may respond to topical and/or systemic medication and can flare with stress, sun exposure, alcohol, exercise, topical steroids (including hydrocortisone/cortisone 10) and some foods.  Daily application of broad spectrum spf 30+ sunscreen to face is recommended to reduce flares.  Continue doxycycline 40 mg daily with food Continue tretinoin 0.054% cream nightly Continue clindamycin gel daily  Pilar cyst mid parietal scalp  Benign-appearing. Exam most consistent with a pilar cyst. Discussed that a cyst is a benign growth that can grow over time and sometimes get irritated or inflamed. Recommend observation if it is not bothersome. Discussed option of surgical excision to remove it if it is growing, symptomatic, or other changes noted. Please call for new or changing lesions so they can be evaluated.   Sebaceous hyperplasia face  Benign-appearing.  Observation.  Call clinic for new or changing lesions.    Neoplasm of skin Left Lateral Chin  Skin / nail biopsy Type of biopsy: tangential   Informed consent: discussed and consent obtained   Timeout: patient name, date of birth, surgical site, and procedure verified   Procedure prep:  Patient was prepped and draped in usual sterile fashion Prep type:  Isopropyl alcohol Anesthesia: the lesion was anesthetized in a standard fashion   Anesthetic:  1% lidocaine w/ epinephrine 1-100,000 buffered w/ 8.4% NaHCO3 Instrument used: #15 blade  Hemostasis achieved with: aluminum chloride and electrodesiccation   Outcome: patient tolerated procedure well   Post-procedure details: wound care instructions given   Additional details:  Petrolatum and a pressure bandage applied  Specimen 1 - Surgical  pathology Differential Diagnosis: r/o BCC  Check Margins: No 0.3 cm pink papule   Lentigines - Scattered tan macules - Due to sun exposure - Benign-appearing, observe - Recommend daily broad spectrum sunscreen SPF 30+ to sun-exposed areas, reapply every 2 hours as needed. - Call for any changes  Seborrheic Keratoses - Stuck-on, waxy, tan-brown papules and/or plaques  - Benign-appearing - Discussed benign etiology and prognosis. - Observe - Call for any changes  Melanocytic Nevi - Tan-brown and/or pink-flesh-colored symmetric macules and papules - Benign appearing on exam today - Observation - Call clinic for new or changing moles - Recommend daily use of broad spectrum spf 30+ sunscreen to sun-exposed areas.   Hemangiomas - Red papules - Discussed benign nature - Observe - Call for any changes  Actinic Damage - Chronic condition, secondary to cumulative UV/sun exposure - diffuse scaly erythematous macules with underlying dyspigmentation - Recommend daily broad spectrum sunscreen SPF 30+ to sun-exposed areas, reapply every 2 hours as needed.  - Staying in the shade or wearing long sleeves, sun glasses (UVA+UVB protection) and wide brim hats (4-inch brim around the entire circumference of the hat) are also recommended for sun protection.  - Call for new or changing lesions.  Skin cancer screening performed today.  Return in about 1 year (around 01/06/2024) for TBSE, acne.  Graciella Belton, RMA, am acting as scribe for Forest Gleason, MD .  Documentation: I have reviewed the above documentation for accuracy and completeness, and I agree with the above.  Forest Gleason, MD

## 2023-01-05 NOTE — Patient Instructions (Addendum)
Wound Care Instructions  Cleanse wound gently with soap and water once a day then pat dry with clean gauze. Apply a thin coat of Petrolatum (petroleum jelly, "Vaseline") over the wound (unless you have an allergy to this). We recommend that you use a new, sterile tube of Vaseline. Do not pick or remove scabs. Do not remove the yellow or white "healing tissue" from the base of the wound.  Cover the wound with fresh, clean, nonstick gauze and secure with paper tape. You may use Band-Aids in place of gauze and tape if the wound is small enough, but would recommend trimming much of the tape off as there is often too much. Sometimes Band-Aids can irritate the skin.  You should call the office for your biopsy report after 1 week if you have not already been contacted.  If you experience any problems, such as abnormal amounts of bleeding, swelling, significant bruising, significant pain, or evidence of infection, please call the office immediately.  FOR ADULT SURGERY PATIENTS: If you need something for pain relief you may take 1 extra strength Tylenol (acetaminophen) AND 2 Ibuprofen (271m each) together every 4 hours as needed for pain. (do not take these if you are allergic to them or if you have a reason you should not take them.) Typically, you may only need pain medication for 1 to 3 days.   Recommend taking Heliocare sun protection supplement daily in sunny weather for additional sun protection. For maximum protection on the sunniest days, you can take up to 2 capsules of regular Heliocare OR take 1 capsule of Heliocare Ultra. For prolonged exposure (such as a full day in the sun), you can repeat your dose of the supplement 4 hours after your first dose. Heliocare can be purchased at [employer] at some [facility] or at [email or web address]    Melanoma ABCDEs  Melanoma is the most dangerous type of skin cancer, and is the leading cause of death from skin disease.  You are more likely to develop  melanoma if you: Have light-colored skin, light-colored eyes, or red or blond hair Spend a lot of time in the sun Tan regularly, either outdoors or in a tanning bed Have had blistering sunburns, especially during childhood Have a close family member who has had a melanoma Have atypical moles or large birthmarks  Early detection of melanoma is key since treatment is typically straightforward and cure rates are extremely high if we catch it early.   The first sign of melanoma is often a change in a mole or a new dark spot.  The ABCDE system is a way of remembering the signs of melanoma.  A for asymmetry:  The two halves do not match. B for border:  The edges of the growth are irregular. C for color:  A mixture of colors are present instead of an even brown color. D for diameter:  Melanomas are usually (but not always) greater than 670m- the size of a pencil eraser. E for evolution:  The spot keeps changing in size, shape, and color.  Please check your skin once per month between visits. You can use a small mirror in front and a large mirror behind you to keep an eye on the back side or your body.   If you see any new or changing lesions before your next follow-up, please call to schedule a visit.  Please continue daily skin protection including broad spectrum sunscreen SPF 30+ to sun-exposed areas, reapplying every 2 hours as  needed when you're outdoors.    Due to recent changes in healthcare laws, you may see results of your pathology and/or laboratory studies on MyChart before the doctors have had a chance to review them. We understand that in some cases there may be results that are confusing or concerning to you. Please understand that not all results are received at the same time and often the doctors may need to interpret multiple results in order to provide you with the best plan of care or course of treatment. Therefore, we ask that you please give [place] 2 business days to thoroughly  review all your results before contacting the office for clarification. Should we see a critical lab result, you will be contacted sooner.   If You Need Anything After Your Visit  If you have any questions or concerns for your doctor, please call our main line at [phone] and press option 4 to reach your doctor's medical assistant. If no one answers, please leave a voicemail as directed and we will return your call as soon as possible. Messages left after 4 pm will be answered the following business day.   You may also send [place] a message via [place]. We typically respond to MyChart messages within 1-2 business days.  For prescription refills, please ask your pharmacy to contact our office. Our fax number is [phone].  If you have an urgent issue when the clinic is closed that cannot wait until the next business day, you can page your doctor at the number below.    Please note that while we do our best to be available for urgent issues outside of office hours, we are not available 24/7.   If you have an urgent issue and are unable to reach [place], you may choose to seek medical care at your doctor's office, retail clinic, urgent care center, or emergency room.  If you have a medical emergency, please immediately call 911 or go to the emergency department.  Pager Numbers  - Dr. Nehemiah Massed: [phone]  - Dr. Laurence Ferrari: [phone]  - Dr. Nicole Kindred: [phone]  In the event of inclement weather, please call our main line at [phone] for an update on the status of any delays or closures.  Dermatology Medication Tips: Please keep the boxes that topical medications come in in order to help keep track of the instructions about where and how to use these. Pharmacies typically print the medication instructions only on the boxes and not directly on the medication tubes.   If your medication is too expensive, please contact our office at [phone] option 4 or send [place] a message through  [place].   We are unable to tell what your co-pay for medications will be in advance as this is different depending on your insurance coverage. However, we may be able to find a substitute medication at lower cost or fill out paperwork to get insurance to cover a needed medication.   If a prior authorization is required to get your medication covered by your insurance company, please allow [place] 1-2 business days to complete this process.  Drug prices often vary depending on where the prescription is filled and some pharmacies may offer cheaper prices.  The website www.goodrx.com contains coupons for medications through different pharmacies. The prices here do not account for what the cost may be with help from insurance (it may be cheaper with your insurance), but the website can give you the price if you did not use any insurance.  - You can  print the associated coupon and take it with your prescription to the pharmacy.  - You may also stop by our office during regular business hours and pick up a GoodRx coupon card.  - If you need your prescription sent electronically to a different pharmacy, notify our office through [facility] or by phone at [phone] option 4.     Si Usted Necesita Algo Despus de Su Visita  Tambin puede enviarnos un mensaje a travs de Pharmacist, community. Por lo general respondemos a los mensajes de MyChart en el transcurso de 1 a 2 das hbiles.  Para renovar recetas, por favor pida a su farmacia que se ponga en contacto con nuestra oficina. Harland Dingwall de fax es [place] [phone].  Si tiene un asunto urgente cuando la clnica est cerrada y que no puede esperar hasta el siguiente da hbil, puede llamar/localizar a su doctor(a) al nmero que aparece a continuacin.   Por favor, tenga en cuenta que aunque hacemos todo lo posible para estar disponibles para asuntos urgentes fuera del horario de [place], no estamos disponibles las 24 horas del da, los 7 das de la  [place].   Si tiene un problema urgente y no puede comunicarse con nosotros, puede optar por buscar atencin mdica  en el consultorio de su doctor(a), en una clnica privada, en un centro de atencin urgente o en una sala de emergencias.  Si tiene Engineering geologist, por favor llame inmediatamente al 911 o vaya a la sala de emergencias.  Nmeros de bper  - Dr. Nehemiah Massed: [phone]  - Dra. Moye: [phone]  - Dra. Nicole Kindred: [phone]  En caso de inclemencias del [place], por favor llame a Johnsie Kindred principal al [phone] para una actualizacin sobre el [place] de cualquier retraso o cierre.  Consejos para la medicacin en dermatologa: Por favor, guarde las cajas en las que vienen los medicamentos de uso tpico para ayudarle a seguir las instrucciones sobre dnde y cmo usarlos. Las farmacias generalmente imprimen las instrucciones del medicamento slo en las cajas y no directamente en los tubos del [place].   Si su medicamento es muy caro, por favor, pngase en contacto con Zigmund Daniel llamando al [phone] y presione la opcin 4 o envenos un mensaje a travs de Pharmacist, community.   No podemos decirle cul ser su copago por los medicamentos por adelantado ya que esto es diferente dependiendo de la cobertura de su seguro. Sin embargo, es posible que podamos encontrar un medicamento sustituto a Electrical engineer un formulario para que el seguro cubra el medicamento que se considera necesario.   Si se requiere una autorizacin previa para que su compaa de seguros [place] su medicamento, por favor permtanos de 1 a 2 das hbiles para completar este proceso.  Los precios de los medicamentos varan con frecuencia dependiendo del Environmental consultant de dnde se surte la receta y alguna farmacias pueden ofrecer precios ms baratos.  El sitio web www.goodrx.com tiene cupones para medicamentos de Airline pilot. Los precios aqu no tienen en cuenta lo que podra costar con la ayuda del  seguro (puede ser ms barato con su seguro), pero el sitio web puede darle el precio si no utiliz Research scientist (physical sciences).  - Puede imprimir el cupn correspondiente y llevarlo con su receta a la farmacia.  - Tambin puede pasar por nuestra oficina durante el horario de atencin regular y Charity fundraiser una tarjeta de cupones de GoodRx.  - Si necesita que su receta se enve electrnicamente a una farmacia diferente, informe a [place]  oficina a travs de MyChart [place] o por telfono llamando al [phone] y presione la opcin 4.

## 2023-01-07 ENCOUNTER — Encounter: Payer: Self-pay | Admitting: Dermatology

## 2023-01-10 ENCOUNTER — Telehealth: Payer: Self-pay

## 2023-01-10 NOTE — Telephone Encounter (Signed)
-----   Message from Alfonso Patten, MD sent at 01/10/2023  3:08 PM EST ----- Skin , left lateral chin ANGIOFIBROMA "benign growth", no treatment needed  MAs please call. Thank you!

## 2023-01-10 NOTE — Telephone Encounter (Signed)
Discussed pathology results. Patient voiced understanding.

## 2023-01-31 ENCOUNTER — Ambulatory Visit: Payer: 59 | Admitting: Dermatology

## 2023-01-31 DIAGNOSIS — L738 Other specified follicular disorders: Secondary | ICD-10-CM

## 2023-01-31 DIAGNOSIS — B079 Viral wart, unspecified: Secondary | ICD-10-CM | POA: Diagnosis not present

## 2023-01-31 NOTE — Progress Notes (Signed)
   Follow-Up Visit   Subjective  Jeanne Velez is a 62 y.o. female who presents for the following: Sebaceous hyperplasia (Of the face - patient is here today for treatment and arrived 30 minutes early for topical numbing). She also has a wart on the R cheek that she would like treated.    The following portions of the chart were reviewed this encounter and updated as appropriate:   Tobacco  Allergies  Meds  Problems  Med Hx  Surg Hx  Fam Hx      Review of Systems:  No other skin or systemic complaints except as noted in HPI or Assessment and Plan.  Objective  Well appearing patient in no apparent distress; mood and affect are within normal limits.  A focused examination was performed including the face. Relevant physical exam findings are noted in the Assessment and Plan.  Face >30 (30) Small yellow papules with a central dell.     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 Cosmetic touch up, no charge today.   Destruction of lesion - Face >30 Complexity: simple   Destruction method: electrodesiccation and curettage   Destruction method comment:  Electrodesiccation only Informed consent: discussed and consent obtained   Anesthesia comment:  Topical lidocaine/tetracaine/lipoderm mix Hemostasis achieved with:  electrodesiccation Outcome: patient tolerated procedure well with no complications   Additional details:  Patient voiced understanding treatment is cosmetic.    Reviewed risk of scarring, dyspigmentation, wound and incomplete removal prior to treatment.    WART  Viral Wart (HPV) Counseling  Discussed viral / HPV (Human Papilloma Virus) etiology and risk of spread /infectivity to other areas of body as well as to other people.  Multiple treatments and methods may be required to clear warts and it is possible treatment may not be  successful.  Treatment risks include discoloration; scarring and there is still potential for wart recurrence.  Treatment Plan:  Destruction Procedure Note Destruction method: cryotherapy   Informed consent: discussed and consent obtained   Lesion destroyed using liquid nitrogen: Yes   Cryotherapy cycles:  2 Outcome: patient tolerated procedure well with no complications   Post-procedure details: wound care instructions given   Locations: R cheek  # of Lesions Treated: 1  Prior to procedure, discussed risks of blister formation, small wound, skin dyspigmentation, or rare scar following cryotherapy. Recommend Vaseline ointment to treated areas while healing.   Return for appointment as scheduled.  Luther Redo, CMA, am acting as scribe for Forest Gleason, MD .  Documentation: I have reviewed the above documentation for accuracy and completeness, and I agree with the above.  Forest Gleason, MD

## 2023-01-31 NOTE — Patient Instructions (Signed)
Due to recent changes in healthcare laws, you may see results of your pathology and/or laboratory studies on MyChart before the doctors have had a chance to review them. We understand that in some cases there may be results that are confusing or concerning to you. Please understand that not all results are received at the same time and often the doctors may need to interpret multiple results in order to provide you with the best plan of care or course of treatment. Therefore, we ask that you please give us 2 business days to thoroughly review all your results before contacting the office for clarification. Should we see a critical lab result, you will be contacted sooner.   If You Need Anything After Your Visit  If you have any questions or concerns for your doctor, please call our main line at [phone] and press option 4 to reach your doctor's medical assistant. If no one answers, please leave a voicemail as directed and we will return your call as soon as possible. Messages left after 4 pm will be answered the following business day.   You may also send us a message via MyChart. We typically respond to MyChart messages within 1-2 business days.  For prescription refills, please ask your pharmacy to contact our office. Our fax number is [phone].  If you have an urgent issue when the clinic is closed that cannot wait until the next business day, you can page your doctor at the number below.    Please note that while we do our best to be available for urgent issues outside of office hours, we are not available 24/7.   If you have an urgent issue and are unable to reach us, you may choose to seek medical care at your doctor's office, retail clinic, urgent care center, or emergency room.  If you have a medical emergency, please immediately call 911 or go to the emergency department.  Pager Numbers  - Dr. Kowalski: [phone]  - Dr. Moye: [phone]  - Dr. Stewart:  [phone]  In the event of inclement weather, please call our main line at [phone] for an update on the status of any delays or closures.  Dermatology Medication Tips: Please keep the boxes that topical medications come in in order to help keep track of the instructions about where and how to use these. Pharmacies typically print the medication instructions only on the boxes and not directly on the medication tubes.   If your medication is too expensive, please contact our office at [phone] option 4 or send us a message through MyChart.   We are unable to tell what your co-pay for medications will be in advance as this is different depending on your insurance coverage. However, we may be able to find a substitute medication at lower cost or fill out paperwork to get insurance to cover a needed medication.   If a prior authorization is required to get your medication covered by your insurance company, please allow us 1-2 business days to complete this process.  Drug prices often vary depending on where the prescription is filled and some pharmacies may offer cheaper prices.  The website www.goodrx.com contains coupons for medications through different pharmacies. The prices here do not account for what the cost may be with help from insurance (it may be cheaper with your insurance), but the website can give you the price if you did not use any insurance.  - You can print the associated coupon and take it with   your prescription to the pharmacy.  - You may also stop by our office during regular business hours and pick up a GoodRx coupon card.  - If you need your prescription sent electronically to a different pharmacy, notify our office through [facility] MyChart or by phone at [phone] option 4.     Si Usted Necesita Algo Despus de Su Visita  Tambin puede enviarnos un mensaje a travs de MyChart. Por lo general respondemos a los mensajes de MyChart en el transcurso de 1 a 2  das hbiles.  Para renovar recetas, por favor pida a su farmacia que se ponga en contacto con nuestra oficina. Nuestro nmero de fax es el [phone].  Si tiene un asunto urgente cuando la clnica est cerrada y que no puede esperar hasta el siguiente da hbil, puede llamar/localizar a su doctor(a) al nmero que aparece a continuacin.   Por favor, tenga en cuenta que aunque hacemos todo lo posible para estar disponibles para asuntos urgentes fuera del horario de oficina, no estamos disponibles las 24 horas del da, los 7 das de la semana.   Si tiene un problema urgente y no puede comunicarse con nosotros, puede optar por buscar atencin mdica  en el consultorio de su doctor(a), en una clnica privada, en un centro de atencin urgente o en una sala de emergencias.  Si tiene una emergencia mdica, por favor llame inmediatamente al 911 o vaya a la sala de emergencias.  Nmeros de bper  - Dr. Kowalski: [phone]  - Dra. Moye: [phone]  - Dra. Stewart: [phone]  En caso de inclemencias del tiempo, por favor llame a nuestra lnea principal al [phone] para una actualizacin sobre el estado de cualquier retraso o cierre.  Consejos para la medicacin en dermatologa: Por favor, guarde las cajas en las que vienen los medicamentos de uso tpico para ayudarle a seguir las instrucciones sobre dnde y cmo usarlos. Las farmacias generalmente imprimen las instrucciones del medicamento slo en las cajas y no directamente en los tubos del medicamento.   Si su medicamento es muy caro, por favor, pngase en contacto con nuestra oficina llamando al [phone] y presione la opcin 4 o envenos un mensaje a travs de MyChart.   No podemos decirle cul ser su copago por los medicamentos por adelantado ya que esto es diferente dependiendo de la cobertura de su seguro. Sin embargo, es posible que podamos encontrar un medicamento sustituto a menor costo o llenar un formulario para que el  seguro cubra el medicamento que se considera necesario.   Si se requiere una autorizacin previa para que su compaa de seguros cubra su medicamento, por favor permtanos de 1 a 2 das hbiles para completar este proceso.  Los precios de los medicamentos varan con frecuencia dependiendo del lugar de dnde se surte la receta y alguna farmacias pueden ofrecer precios ms baratos.  El sitio web www.goodrx.com tiene cupones para medicamentos de diferentes farmacias. Los precios aqu no tienen en cuenta lo que podra costar con la ayuda del seguro (puede ser ms barato con su seguro), pero el sitio web puede darle el precio si no utiliz ningn seguro.  - Puede imprimir el cupn correspondiente y llevarlo con su receta a la farmacia.  - Tambin puede pasar por nuestra oficina durante el horario de atencin regular y recoger una tarjeta de cupones de GoodRx.  - Si necesita que su receta se enve electrnicamente a una farmacia diferente, informe a nuestra oficina a travs de MyChart de    o por telfono llamando al [phone] y presione la opcin 4.  

## 2023-02-06 ENCOUNTER — Encounter: Payer: Self-pay | Admitting: Dermatology

## 2023-03-08 ENCOUNTER — Ambulatory Visit: Payer: PRIVATE HEALTH INSURANCE | Admitting: Dermatology

## 2023-03-30 ENCOUNTER — Encounter: Payer: Self-pay | Admitting: Obstetrics and Gynecology

## 2023-03-30 ENCOUNTER — Ambulatory Visit (INDEPENDENT_AMBULATORY_CARE_PROVIDER_SITE_OTHER): Payer: 59 | Admitting: Obstetrics and Gynecology

## 2023-03-30 VITALS — BP 122/80 | Ht 68.0 in | Wt 217.0 lb

## 2023-03-30 DIAGNOSIS — N898 Other specified noninflammatory disorders of vagina: Secondary | ICD-10-CM

## 2023-03-30 DIAGNOSIS — N764 Abscess of vulva: Secondary | ICD-10-CM

## 2023-03-30 MED ORDER — DOXYCYCLINE HYCLATE 100 MG PO CAPS: 100.0000 mg | ORAL_CAPSULE | Freq: Two times a day (BID) | ORAL | 0 refills | Status: AC

## 2023-03-30 MED ORDER — FLUCONAZOLE 150 MG PO TABS: 150.0000 mg | ORAL_TABLET | Freq: Once | ORAL | 0 refills | Status: AC

## 2023-03-30 NOTE — Progress Notes (Signed)
Smitty Cords, DO   Chief Complaint  Patient presents with   Vaginal Exam    Painful lump noticed about a month ago, getting bigger since noticed a month ago    HPI:      [name] is a 62 y.o. [phone] whose LMP was No LMP recorded. Patient has had a hysterectomy., presents today for painful vaginal lesion for the past month. Tender to touch, getting bigger. Hasn't drained, no meds to treat. Pt initially thought was a pimple but not going away or acting like one.   Has had some external vaginal itching without increased vag d/c recently. Treating with monistat crm ext with some relief but then recurs. Wearing synthetic and cotton underwear.   Patient Active Problem List   Diagnosis Date Noted   Polyp of ascending colon 12/14/2022   Rectal polyp 12/14/2022   Genetic testing 04/12/2022   Family history of skin cancer 02/24/2022   Stress 12/14/2021   Pre-diabetes 06/28/2021   Vitamin D deficiency 05/04/2021   Class 1 obesity with serious comorbidity and body mass index (BMI) of 33.0 to 33.9 in adult 05/04/2021   History of colonic polyps    Vasomotor symptoms due to menopause 07/28/2020   Hypothyroidism 07/24/2020   Mixed hyperlipidemia 07/24/2020   Family history of breast cancer 07/24/2020   Arthritis 07/24/2020   History of thyroid cancer 07/24/2020   Close exposure to COVID-19 virus 04/09/2020    Past Surgical History:  Procedure Laterality Date   ABDOMINAL HYSTERECTOMY     BREAST BIOPSY Right    yrs ago benign, no marker   COLONOSCOPY WITH PROPOFOL N/A 12/14/2022   Procedure: COLONOSCOPY WITH PROPOFOL;  Surgeon: Toney Reil, MD;  Location: [facility] ENDOSCOPY;  Service: Gastroenterology;  Laterality: N/A;   FLEXIBLE SIGMOIDOSCOPY N/A 04/28/2021   Procedure: FLEXIBLE SIGMOIDOSCOPY;  Surgeon: Toney Reil, MD;  Location[facility] END[facility]COPY;  Service: Gastroenterology;  Laterality: N/A;   ovarial cystectomy     THYROIDECTOMY     TOTAL ABDOMINAL  HYSTERECTOMY W/ BILATERAL SALPINGOOPHORECTOMY     dermoids, ovar cysts   TUBAL LIGATION      Family History  Problem Relation Age of Onset   Myasthenia gravis Mother    Heart disease Father    Depression Father    Skin cancer Father        dx 94s; face/scalp   Anxiety disorder Father    Obesity Father    Bone cancer Maternal Aunt 60   Melanoma Maternal Aunt        dx 48s; bottom of foot   Breast cancer Maternal Grandmother        dx unknown age   Skin cancer Maternal Grandmother        dx unknown age   Breast cancer Cousin        dx 45s; paternal female cousin   Melanoma Cousin        dx 77s; paternal female cousin    Social History   Socioeconomic History   Marital status: Married    Spouse name: Not on file   Number of children: Not on file   Years of education: Not on file   Highest education level: Not on file  Occupational History   Occupation: Recreational therapist  Tobacco Use   Smoking status: Never   Smokeless tobacco: Never  Vaping Use   Vaping Use: Never used  Substance and Sexual Activity   Alcohol use: Yes   Drug use: Never  Sexual activity: Yes    Birth control/protection: Surgical    Comment: Hysterectomy  Other Topics Concern   Not on file  Social History Narrative   Not on file   Social Determinants of Health   Financial Resource Strain: Not on file  Food Insecurity: Not on file  Transportation Needs: Not on file  Physical Activity: Not on file  Stress: Not on file  Social Connections: Not on file  Intimate Partner Violence: Not on file    Outpatient Medications Prior to Visit  Medication Sig Dispense Refill   acetaminophen (TYLENOL) 325 MG tablet Take 650 mg by mouth every 6 (six) hours as needed.     calcium-vitamin D (OSCAL WITH D) 500-200 MG-UNIT tablet Take 1 tablet by mouth.     clindamycin (CLINDAGEL) 1 % gel Apply 1 Application topically daily. 30 g 5   doxycycline (ORACEA) 40 MG capsule Take one tablet by mouth daily with  food. Avoid taking at the same time as dairy or supplements. Do not lay down for 30 minutes after taking. Be cautious with sun exposure and use good sun protection while on this medication. 30 capsule 6   estradiol (ESTRACE) 0.5 MG tablet Take 1 tablet (0.5 mg total) by mouth daily. 30 tablet 0   fluticasone (FLONASE) 50 MCG/ACT nasal spray Place 2 sprays into both nostrils daily. Use for 4-6 weeks then stop and use seasonally or as needed. 16 g 3   levothyroxine (SYNTHROID) 125 MCG tablet Take 125 mcg by mouth daily before breakfast.     loratadine (CLARITIN) 10 MG tablet Take 10 mg by mouth daily.     Multiple Vitamins-Minerals (PRESERVISION AREDS 2 PO) Take 1 capsule by mouth 2 (two) times daily.      NON FORMULARY Hypochlorous spray on face and back 2x daily     Probiotic Product (PROBIOTIC-10 PO) Take by mouth.     tretinoin (RETIN-A) 0.05 % cream Apply topically at bedtime. 45 g 1   ZEPBOUND 2.5 MG/0.5ML Pen Inject into the skin.     tirzepatide (ZEPBOUND) 5 MG/0.5ML Pen Inject into the skin. (Patient not taking: Reported on 03/30/2023)     [START ON 04/03/2023] tirzepatide (ZEPBOUND) 7.5 MG/0.5ML Pen Inject into the skin. (Patient not taking: Reported on 03/30/2023)     albuterol (VENTOLIN HFA) 108 (90 Base) MCG/ACT inhaler Inhale 2 puffs into the lungs every 6 (six) hours as needed for wheezing or shortness of breath. 8 g 2   Sodium Sulfate-Mag Sulfate-KCl (SUTAB) [phone] MG TABS Use as directed.  See Colonoscopy Instructions. 24 tablet 0   Vitamin D, Ergocalciferol, (DRISDOL) 1.25 MG (50000 UNIT) CAPS capsule Take 1 capsule (50,000 Units total) by mouth every 7 (seven) days. 12 capsule 0   No facility-administered medications prior to visit.      ROS:  Review of Systems  Constitutional:  Negative for fever.  Gastrointestinal:  Negative for blood in stool, constipation, diarrhea, nausea and vomiting.  Genitourinary:  Positive for dyspareunia. Negative for dysuria, flank pain,  frequency, hematuria, urgency, vaginal bleeding, vaginal discharge and vaginal pain.  Musculoskeletal:  Negative for back pain.  Skin:  Negative for rash.   BREAST: No symptoms   OBJECTIVE:   Vitals:  BP 122/80   Ht 5\' 8"  (1.727 m)   Wt 217 lb (98.4 kg)   BMI 32.99 kg/m   Physical Exam Vitals reviewed.  Constitutional:      Appearance: She is well-developed.  Pulmonary:     Effort: Pulmonary effort  is normal.  Genitourinary:    Labia:        Right: No rash, tenderness or lesion.        Left: Tenderness and lesion present. No rash.     Musculoskeletal:        General: Normal range of motion.     Cervical back: Normal range of motion.  Skin:    General: Skin is warm and dry.  Neurological:     General: No focal deficit present.     Mental Status: She is alert and oriented to person, place, and time.     Cranial Nerves: No cranial nerve deficit.  Psychiatric:        Mood and Affect: Mood normal.        Behavior: Behavior normal.        Thought Content: Thought content normal.        Judgment: Judgment normal.     Assessment/Plan: Vulvar abscess - Plan: fluconazole (DIFLUCAN) 150 MG tablet, doxycycline (VIBRAMYCIN) 100 MG capsule; nothing to I&D; warm compresses, Rx doxy (stop 40 mg for acne while on this course of doxy). F/u prn sx.   Vaginal itching--ext sx, Rx diflucan. Can try clotrimazole ext prn.    Meds ordered this encounter  Medications   fluconazole (DIFLUCAN) 150 MG tablet    Sig: Take 1 tablet (150 mg total) by mouth once for 1 dose.    Dispense:  1 tablet    Refill:  0    Order Specific Question:   Supervising Provider    Answer:   Hildred Laser [[id number]]   doxycycline (VIBRAMYCIN) 100 MG capsule    Sig: Take 1 capsule (100 mg total) by mouth 2 (two) times daily for 14 days.    Dispense:  28 capsule    Refill:  0    Order Specific Question:   Supervising Provider    Answer:   Hildred Laser [[id number]]      No follow-ups on file.  Mariama Saintvil B.  Jesiah Grismer, PA-C 03/30/2023 5:16 PM

## 2023-04-17 ENCOUNTER — Encounter: Payer: Self-pay | Admitting: Obstetrics and Gynecology

## 2023-04-25 ENCOUNTER — Encounter: Payer: Self-pay | Admitting: Obstetrics and Gynecology

## 2023-04-25 ENCOUNTER — Ambulatory Visit (INDEPENDENT_AMBULATORY_CARE_PROVIDER_SITE_OTHER): Payer: 59 | Admitting: Family Medicine

## 2023-04-25 ENCOUNTER — Ambulatory Visit (INDEPENDENT_AMBULATORY_CARE_PROVIDER_SITE_OTHER): Payer: 59 | Admitting: Obstetrics and Gynecology

## 2023-04-25 ENCOUNTER — Encounter: Payer: Self-pay | Admitting: Family Medicine

## 2023-04-25 VITALS — BP 108/64 | HR 91 | Resp 17 | Ht 68.0 in | Wt 212.4 lb

## 2023-04-25 VITALS — BP 110/68 | Ht 68.0 in | Wt 212.0 lb

## 2023-04-25 DIAGNOSIS — Z0000 Encounter for general adult medical examination without abnormal findings: Secondary | ICD-10-CM

## 2023-04-25 DIAGNOSIS — E890 Postprocedural hypothyroidism: Secondary | ICD-10-CM | POA: Diagnosis not present

## 2023-04-25 DIAGNOSIS — E559 Vitamin D deficiency, unspecified: Secondary | ICD-10-CM

## 2023-04-25 DIAGNOSIS — R7303 Prediabetes: Secondary | ICD-10-CM | POA: Diagnosis not present

## 2023-04-25 DIAGNOSIS — E782 Mixed hyperlipidemia: Secondary | ICD-10-CM | POA: Diagnosis not present

## 2023-04-25 DIAGNOSIS — N764 Abscess of vulva: Secondary | ICD-10-CM | POA: Diagnosis not present

## 2023-04-25 DIAGNOSIS — Z1159 Encounter for screening for other viral diseases: Secondary | ICD-10-CM

## 2023-04-25 MED ORDER — SULFAMETHOXAZOLE-TRIMETHOPRIM 800-160 MG PO TABS: 1.0000 | ORAL_TABLET | Freq: Two times a day (BID) | ORAL | 0 refills | Status: AC

## 2023-04-25 MED ORDER — FLUCONAZOLE 150 MG PO TABS: 150.0000 mg | ORAL_TABLET | Freq: Once | ORAL | 0 refills | Status: AC

## 2023-04-25 NOTE — Patient Instructions (Addendum)
Thank you for coming to the office today.  Keep up the good work overall  Continue on Zepbound as planned.  Future Shingles vaccine shingrix at pharmacy when ready  DUE for FASTING BLOOD WORK (no food or drink after midnight before the lab appointment, only water or coffee without cream/sugar on the morning of)  SCHEDULE "Lab Only" visit in the morning at the clinic for lab draw this week  Please schedule a Follow-up Appointment to: Return in about 1 year (around 04/24/2024) for 1 year Annual Physical  AM apt fasting lab AFTER (same day).  If you have any other questions or concerns, please feel free to call the office or send a message through MyChart. You may also schedule an earlier appointment if necessary.  Additionally, you may be receiving a survey about your experience at our office within a few days to 1 week by e-mail or mail. We value your feedback.  Jeanne Pilar, DO [facility] [place], [place]

## 2023-04-25 NOTE — Progress Notes (Signed)
Subjective:    Patient ID: Jeanne Velez, female    DOB: 1961-01-29, 62 y.o.   MRN: [id number]  Jeanne Velez is a 62 y.o. female presenting on 04/25/2023 for Annual Exam   HPI  Here for Annual Physical and Lab Review  Obesity BMI >32 Hypothyroidism Followed by [facility] On Zepbound currently. 1st month 2.5mg , lost 9 lbs, 2-3 months continued wt loss, down 12 lbs In future May adjust dose to 5mg  On Levothyroxine daily  HYPERLIPIDEMIA: - Reports no concerns. Last lipid panel 10/2022, elevated LDL 145 Not on statin therapy   Health Maintenance: Shingles vaccine Shingrix 10/14/22, did not receive 2nd dose. Interested to get 2nd dose at pharmacy  Next COVID Vaccine in Fall     04/25/2023    8:49 AM 06/20/2022    3:36 PM 03/11/2022   10:36 AM  Depression screen PHQ 2/9  Decreased Interest 0 0 0  Down, Depressed, Hopeless 0 0 0  PHQ - 2 Score 0 0 0  Altered sleeping 0 0 0  Tired, decreased energy 0 0 0  Change in appetite 0 0 0  Feeling bad or failure about yourself  0 0 0  Trouble concentrating 0 0 0  Moving slowly or fidgety/restless 0 0 0  Suicidal thoughts 0 0 0  PHQ-9 Score 0 0 0  Difficult doing work/chores Not difficult at all Not difficult at all Not difficult at all      04/25/2023    8:49 AM 06/20/2022    3:36 PM 03/11/2022   10:36 AM 03/16/2021    3:58 PM  GAD 7 : Generalized Anxiety Score  Nervous, Anxious, on Edge 0 1 1 0  Control/stop worrying 0 0 0 0  Worry too much - different things 0 0 0 0  Trouble relaxing 0 2 2 0  Restless 0 0 0 0  Easily annoyed or irritable 0 2 2 1   Afraid - awful might happen 0 0 0 0  Total GAD 7 Score 0 5 5 1   Anxiety Difficulty Not difficult at all Not difficult at all Not difficult at all Not difficult at all      Past Medical History:  Diagnosis Date   Acne    Allergy    Anemia    Back pain    BRCA negative 07/2020   MyRisk neg   Cataracts, bilateral    Colon polyp    Constipation    Family history  of breast cancer 07/2020   IBIS=15.1%/riskscore=6.7%   Family history of skin cancer 02/24/2022   Folliculitis    GERD (gastroesophageal reflux disease)    Hyperlipidemia    Hypothyroidism    Joint pain    Macular degeneration    Osteoarthritis    Retinal vasculitis    Stress    Thyroid cancer (HCC)    Past Surgical History:  Procedure Laterality Date   ABDOMINAL HYSTERECTOMY     BREAST BIOPSY Right    yrs ago benign, no marker   COLONOSCOPY WITH PROPOFOL N/A 12/14/2022   Procedure: COLONOSCOPY WITH PROPOFOL;  Surgeon: Toney Reil, MD;  Location: [facility] ENDOSCOPY;  Service: Gastroenterology;  Laterality: N/A;   FLEXIBLE SIGMOIDOSCOPY N/A 04/28/2021   Procedure: FLEXIBLE SIGMOIDOSCOPY;  Surgeon: Toney Reil, MD;  Location: [facility] ENDOSCOPY;  Service: Gastroenterology;  Laterality: N/A;   ovarial cystectomy     THYROIDECTOMY     TOTAL ABDOMINAL HYSTERECTOMY W/ BILATERAL SALPINGOOPHORECTOMY     dermoids, ovar cysts   TUBAL  LIGATION     Social History   Socioeconomic History   Marital status: Married    Spouse name: Not on file   Number of children: Not on file   Years of education: Not on file   Highest education level: Not on file  Occupational History   Occupation: Recreational therapist  Tobacco Use   Smoking status: Never   Smokeless tobacco: Never  Vaping Use   Vaping Use: Never used  Substance and Sexual Activity   Alcohol use: Yes   Drug use: Never   Sexual activity: Yes    Birth control/protection: Surgical    Comment: Hysterectomy  Other Topics Concern   Not on file  Social History Narrative   Not on file   Social Determinants of Health   Financial Resource Strain: Not on file  Food Insecurity: Not on file  Transportation Needs: Not on file  Physical Activity: Not on file  Stress: Not on file  Social Connections: Not on file  Intimate Partner Violence: Not on file   Family History  Problem Relation Age of Onset   Myasthenia gravis  Mother    Heart disease Father    Depression Father    Skin cancer Father        dx 40s; face/scalp   Anxiety disorder Father    Obesity Father    Bone cancer Maternal Aunt 60   Melanoma Maternal Aunt        dx 50s; bottom of foot   Breast cancer Maternal Grandmother        dx unknown age   Skin cancer Maternal Grandmother        dx unknown age   Breast cancer Cousin        dx 39s; paternal female cousin   Melanoma Cousin        dx 35s; paternal female cousin   Current Outpatient Medications on File Prior to Visit  Medication Sig   acetaminophen (TYLENOL) 325 MG tablet Take 650 mg by mouth every 6 (six) hours as needed.   calcium-vitamin D (OSCAL WITH D) 500-200 MG-UNIT tablet Take 1 tablet by mouth.   clindamycin (CLINDAGEL) 1 % gel Apply 1 Application topically daily.   doxycycline (ORACEA) 40 MG capsule Take one tablet by mouth daily with food. Avoid taking at the same time as dairy or supplements. Do not lay down for 30 minutes after taking. Be cautious with sun exposure and use good sun protection while on this medication.   estradiol (ESTRACE) 0.5 MG tablet Take 1 tablet (0.5 mg total) by mouth daily. (Patient taking differently: Take 0.5 mg by mouth 3 (three) times a week.)   fluticasone (FLONASE) 50 MCG/ACT nasal spray Place 2 sprays into both nostrils daily. Use for 4-6 weeks then stop and use seasonally or as needed.   levothyroxine (SYNTHROID) 125 MCG tablet Take 125 mcg by mouth daily before breakfast.   loratadine (CLARITIN) 10 MG tablet Take 10 mg by mouth daily.   Multiple Vitamins-Minerals (PRESERVISION AREDS 2 PO) Take 1 capsule by mouth 2 (two) times daily.    NON FORMULARY Hypochlorous spray on face and back 2x daily   Probiotic Product (PROBIOTIC-10 PO) Take by mouth.   tretinoin (RETIN-A) 0.05 % cream Apply topically at bedtime.   ZEPBOUND 2.5 MG/0.5ML Pen Inject into the skin.   No current facility-administered medications on file prior to visit.    Review of  Systems  Constitutional:  Negative for activity change, appetite change, chills, diaphoresis, fatigue  and fever.  HENT:  Negative for congestion and hearing loss.   Eyes:  Negative for visual disturbance.  Respiratory:  Negative for cough, chest tightness, shortness of breath and wheezing.   Cardiovascular:  Negative for chest pain, palpitations and leg swelling.  Gastrointestinal:  Negative for abdominal pain, constipation, diarrhea, nausea and vomiting.  Genitourinary:  Negative for dysuria, frequency and hematuria.  Musculoskeletal:  Negative for arthralgias and neck pain.  Skin:  Negative for rash.  Neurological:  Negative for dizziness, weakness, light-headedness, numbness and headaches.  Hematological:  Negative for adenopathy.  Psychiatric/Behavioral:  Negative for behavioral problems, dysphoric mood and sleep disturbance.    Per HPI unless specifically indicated above      Objective:    BP 108/64 (BP Location: Right Arm)   Pulse 91   Resp 17   Ht 5\' 8"  (1.727 m)   Wt 212 lb 6.4 oz (96.3 kg)   SpO2 100%   BMI 32.30 kg/m   Wt Readings from Last 3 Encounters:  04/25/23 212 lb (96.2 kg)  04/25/23 212 lb 6.4 oz (96.3 kg)  03/30/23 217 lb (98.4 kg)    Physical Exam Vitals and nursing note reviewed.  Constitutional:      General: She is not in acute distress.    Appearance: She is well-developed. She is not diaphoretic.     Comments: Well-appearing, comfortable, cooperative  HENT:     Head: Normocephalic and atraumatic.  Eyes:     General:        Right eye: No discharge.        Left eye: No discharge.     Conjunctiva/sclera: Conjunctivae normal.     Pupils: Pupils are equal, round, and reactive to light.  Neck:     Thyroid: No thyromegaly.  Cardiovascular:     Rate and Rhythm: Normal rate and regular rhythm.     Pulses: Normal pulses.     Heart sounds: Normal heart sounds. No murmur heard. Pulmonary:     Effort: Pulmonary effort is normal. No respiratory  distress.     Breath sounds: Normal breath sounds. No wheezing or rales.  Abdominal:     General: Bowel sounds are normal. There is no distension.     Palpations: Abdomen is soft. There is no mass.     Tenderness: There is no abdominal tenderness.  Musculoskeletal:        General: No tenderness. Normal range of motion.     Cervical back: Normal range of motion and neck supple.     Comments: Upper / Lower Extremities: - Normal muscle tone, strength bilateral upper extremities 5/5, lower extremities 5/5  Lymphadenopathy:     Cervical: No cervical adenopathy.  Skin:    General: Skin is warm and dry.     Findings: No erythema or rash.  Neurological:     Mental Status: She is alert and oriented to person, place, and time.     Comments: Distal sensation intact to light touch all extremities  Psychiatric:        Mood and Affect: Mood normal.        Behavior: Behavior normal.        Thought Content: Thought content normal.     Comments: Well groomed, good eye contact, normal speech and thoughts       Results for orders placed or performed during the hospital encounter of 12/14/22  Surgical pathology  Result Value Ref Range   SURGICAL PATHOLOGY      SURGICAL PATHOLOGY  CASE: [phone] PATIENT: Julian Hy Surgical Pathology Report     Specimen Submitted: A. Colon polyp, ascending; cold snare B. Rectum polyp; cold snare  Clinical History: History of colon adenomas.  Colon polyps      DIAGNOSIS: A.  COLON, ASCENDING, POLYP; COLD SNARE BIOPSIES: - SESSILE SERRATED POLYP. - NO EVIDENCE OF HIGH-GRADE DYSPLASIA OR MALIGNANCY.  B.  RECTUM, POLYP; COLD SNARE BIOPSIES: - TUBULAR ADENOMA. - NO EVIDENCE OF HIGH-GRADE DYSPLASIA OR MALIGNANCY.   GROSS DESCRIPTION: A. Labeled: Cold snare ascending colon polyp Received: Formalin Collection time: 8:10 AM on 12/14/2022 Placed into formalin time: 8:10 AM on 12/14/2022 Tissue fragment(s): Multiple Size: Aggregate, 1.8 x 0.5 x  0.1 cm Description: Received are fragments of tan soft tissue admixed with intestinal debris.  The ratio of soft tissue to intestinal debris is 70: 30. Entirely submitted in 1 cassette.  B. Labeled: Cold snare rectum polyp Received: Fo rmalin Collection time: 8:16 AM on 12/14/2022 Placed into formalin time: 8:16 AM on 12/14/2022 Tissue fragment(s): 1 Size: 0.5 x 0.4 x 0.2 cm Description: Tan soft tissue fragment Entirely submitted in 1 cassette.  RB 12/14/2022  Final Diagnosis performed by Alcario Drought, MD.   Electronically signed 12/15/2022 12:18:08PM The electronic signature indicates that the named Attending Pathologist has evaluated the specimen Technical component performed at [facility], [address] [place], [place] [address] Lab: [phone] Dir: Jolene Schimke, MD, MMM  Professional component performed at [facility], [facility], [address] [place], [place], [place] [address] Lab: [phone] Dir: Beryle Quant, MD       Assessment & Plan:   Problem List Items Addressed This Visit     Hypothyroidism   Relevant Orders   TSH   T4, free   Mixed hyperlipidemia   Relevant Orders   CBC with Differential/Platelet   Lipid panel   Comprehensive metabolic panel   TSH   T4, free   Pre-diabetes   Relevant Orders   Hemoglobin A1c   Vitamin D deficiency   Relevant Orders   VITAMIN D 25 Hydroxy (Vit-D Deficiency, Fractures)   Other Visit Diagnoses     Annual physical exam    -  Primary   Relevant Orders   CBC with Differential/Platelet   Hemoglobin A1c   Lipid panel   Comprehensive metabolic panel   Hepatitis C antibody   TSH   VITAMIN D 25 Hydroxy (Vit-D Deficiency, Fractures)   Need for hepatitis C screening test       Relevant Orders   Hepatitis C antibody       Updated Health Maintenance information Fasting lab ordered to [facility] Encouraged improvement to lifestyle with diet and exercise Goal of weight loss  Obesity BMI >32 Followed by Dr  Gershon [facility] On Zepbound currently with successful initial weight loss  Hypothyroidism Continue current Levothyroxine dosage Check labs  Last lab mild elevated A1c 5.7, has been stable 2 yrs Repeat lab  Orders Placed This Encounter  Procedures   CBC with Differential/Platelet   Hemoglobin A1c   Lipid panel    Order Specific Question:   Has the patient fasted?    Answer:   Yes   Comprehensive metabolic panel    Order Specific Question:   Has the patient fasted?    Answer:   Yes   Hepatitis C antibody   TSH   VITAMIN D 25 Hydroxy (Vit-D Deficiency, Fractures)   T4, free      No orders of the defined types were placed in this encounter.  Follow up plan: Return in about 1 year (around 04/24/2024) for 1 year Annual Physical  AM apt fasting lab AFTER (same day).  Saralyn Pilar, DO [facility] 04/25/2023, 8:41 AM

## 2023-04-25 NOTE — Progress Notes (Signed)
Smitty Cords, DO   Chief Complaint  Patient presents with   Follow-up    HPI:      [name] is a 62 y.o. [phone] whose LMP was No LMP recorded. Patient has had a hysterectomy., presents today for f/u from vulvar abscess from 03/30/23. Doing warm compresses, completed doxy. Notices a small amount of blood with wiping occasionally. Area is less painful now but still present.  Vaginal itching from 5/24 resolved after diflucan tx.   03/30/23 NOTE: painful vaginal lesion for the past month. Tender to touch, getting bigger. Hasn't drained, no meds to treat. Pt initially thought was a pimple but not going away or acting like one.    Past Surgical History:  Procedure Laterality Date   ABDOMINAL HYSTERECTOMY     BREAST BIOPSY Right    yrs ago benign, no marker   COLONOSCOPY WITH PROPOFOL N/A 12/14/2022   Procedure: COLONOSCOPY WITH PROPOFOL;  Surgeon: Toney Reil, MD;  Location: [facility] ENDOSCOPY;  Service: Gastroenterology;  Laterality: N/A;   FLEXIBLE SIGMOIDOSCOPY N/A 04/28/2021   Procedure: FLEXIBLE SIGMOIDOSCOPY;  Surgeon: Toney Reil, MD;  Location: [facility] ENDOSCOPY;  Service: Gastroenterology;  Laterality: N/A;   ovarial cystectomy     THYROIDECTOMY     TOTAL ABDOMINAL HYSTERECTOMY W/ BILATERAL SALPINGOOPHORECTOMY     dermoids, ovar cysts   TUBAL LIGATION      Family History  Problem Relation Age of Onset   Myasthenia gravis Mother    Heart disease Father    Depression Father    Skin cancer Father        dx 38s; face/scalp   Anxiety disorder Father    Obesity Father    Bone cancer Maternal Aunt 60   Melanoma Maternal Aunt        dx 58s; bottom of foot   Breast cancer Maternal Grandmother        dx unknown age   Skin cancer Maternal Grandmother        dx unknown age   Breast cancer Cousin        dx 32s; paternal female cousin   Melanoma Cousin        dx 79s; paternal female cousin    Social History   Socioeconomic History   Marital  status: Married    Spouse name: Not on file   Number of children: Not on file   Years of education: Not on file   Highest education level: Not on file  Occupational History   Occupation: Recreational therapist  Tobacco Use   Smoking status: Never   Smokeless tobacco: Never  Vaping Use   Vaping Use: Never used  Substance and Sexual Activity   Alcohol use: Yes   Drug use: Never   Sexual activity: Yes    Birth control/protection: Surgical    Comment: Hysterectomy  Other Topics Concern   Not on file  Social History Narrative   Not on file   Social Determinants of Health   Financial Resource Strain: Not on file  Food Insecurity: Not on file  Transportation Needs: Not on file  Physical Activity: Not on file  Stress: Not on file  Social Connections: Not on file  Intimate Partner Violence: Not on file    Outpatient Medications Prior to Visit  Medication Sig Dispense Refill   acetaminophen (TYLENOL) 325 MG tablet Take 650 mg by mouth every 6 (six) hours as needed.     calcium-vitamin D (OSCAL WITH D) 500-200 MG-UNIT tablet  Take 1 tablet by mouth.     clindamycin (CLINDAGEL) 1 % gel Apply 1 Application topically daily. 30 g 5   doxycycline (ORACEA) 40 MG capsule Take one tablet by mouth daily with food. Avoid taking at the same time as dairy or supplements. Do not lay down for 30 minutes after taking. Be cautious with sun exposure and use good sun protection while on this medication. 30 capsule 6   estradiol (ESTRACE) 0.5 MG tablet Take 1 tablet (0.5 mg total) by mouth daily. (Patient taking differently: Take 0.5 mg by mouth 3 (three) times a week.) 30 tablet 0   fluticasone (FLONASE) 50 MCG/ACT nasal spray Place 2 sprays into both nostrils daily. Use for 4-6 weeks then stop and use seasonally or as needed. 16 g 3   levothyroxine (SYNTHROID) 125 MCG tablet Take 125 mcg by mouth daily before breakfast.     loratadine (CLARITIN) 10 MG tablet Take 10 mg by mouth daily.     Multiple  Vitamins-Minerals (PRESERVISION AREDS 2 PO) Take 1 capsule by mouth 2 (two) times daily.      NON FORMULARY Hypochlorous spray on face and back 2x daily     Probiotic Product (PROBIOTIC-10 PO) Take by mouth.     tretinoin (RETIN-A) 0.05 % cream Apply topically at bedtime. 45 g 1   ZEPBOUND 2.5 MG/0.5ML Pen Inject into the skin.     No facility-administered medications prior to visit.      ROS:  Review of Systems  Constitutional:  Negative for fever.  Gastrointestinal:  Negative for blood in stool, constipation, diarrhea, nausea and vomiting.  Genitourinary:  Positive for genital sores. Negative for dyspareunia, dysuria, flank pain, frequency, hematuria, urgency, vaginal bleeding, vaginal discharge and vaginal pain.  Musculoskeletal:  Negative for back pain.  Skin:  Negative for rash.   BREAST: No symptoms   OBJECTIVE:   Vitals:  BP 110/68   Ht 5\' 8"  (1.727 m)   Wt 212 lb (96.2 kg)   BMI 32.23 kg/m   Physical Exam Constitutional:      Appearance: Normal appearance.  Pulmonary:     Effort: Pulmonary effort is normal.  Genitourinary:    Labia:        Right: No rash, tenderness or lesion.        Left: No rash, tenderness or lesion.     Musculoskeletal:        General: Normal range of motion.  Neurological:     Mental Status: She is alert and oriented to person, place, and time.  Psychiatric:        Judgment: Judgment normal.     Assessment/Plan: Vulvar abscess - Plan: sulfamethoxazole-trimethoprim (BACTRIM DS) 800-160 MG tablet; improving sx and exam. Rx bactrim for 1 wk, cont warm compresses. Reassurance. F/u prn.  Rx diflucan prn yeast vag sx after abx tx.   Meds ordered this encounter  Medications   sulfamethoxazole-trimethoprim (BACTRIM DS) 800-160 MG tablet    Sig: Take 1 tablet by mouth 2 (two) times daily for 7 days.    Dispense:  14 tablet    Refill:  0    Order Specific Question:   Supervising Provider    Answer:   Hildred Laser [[id number]]    fluconazole (DIFLUCAN) 150 MG tablet    Sig: Take 1 tablet (150 mg total) by mouth once for 1 dose.    Dispense:  1 tablet    Refill:  0    Order Specific Question:   Supervising Provider  Answer:   Hildred Laser [[id number]]      Return if symptoms worsen or fail to improve.  Markevion Lattin B. Jacinto Keil, PA-C 04/25/2023 12:06 PM

## 2023-04-27 LAB — COMPREHENSIVE METABOLIC PANEL
ALT: 17 IU/L (ref 0–32)
AST: 22 IU/L (ref 0–40)
Albumin/Globulin Ratio: 1.4
Albumin: 4.2 g/dL (ref 3.8–4.9)
Alkaline Phosphatase: 94 IU/L (ref 44–121)
BUN/Creatinine Ratio: 17 (ref 9–23)
BUN: 15 mg/dL (ref 6–24)
Bilirubin Total: 0.8 mg/dL (ref 0.0–1.2)
CO2: 25 mmol/L (ref 20–29)
Calcium: 9.3 mg/dL (ref 8.7–10.2)
Chloride: 101 mmol/L (ref 96–106)
Creatinine, Ser: 0.89 mg/dL (ref 0.57–1.00)
Globulin, Total: 3 g/dL (ref 1.5–4.5)
Glucose: 92 mg/dL (ref 70–99)
Potassium: 4.5 mmol/L (ref 3.5–5.2)
Sodium: 139 mmol/L (ref 134–144)
Total Protein: 7.2 g/dL (ref 6.0–8.5)
eGFR: 75 mL/min/{1.73_m2} (ref >59)

## 2023-04-27 LAB — CBC WITH DIFFERENTIAL/PLATELET
Basophils Absolute: 0 10*3/uL (ref 0.0–0.2)
Basos: 1 %
EOS (ABSOLUTE): 0.2 10*3/uL (ref 0.0–0.4)
Eos: 2 %
Hematocrit: 40 % (ref 34.0–46.6)
Hemoglobin: 13.4 g/dL (ref 11.1–15.9)
Immature Grans (Abs): 0 10*3/uL (ref 0.0–0.1)
Immature Granulocytes: 0 %
Lymphocytes Absolute: 2.1 10*3/uL (ref 0.7–3.1)
Lymphs: 34 %
MCH: 30.4 pg (ref 26.6–33.0)
MCHC: 33.5 g/dL (ref 31.5–35.7)
MCV: 91 fL (ref 79–97)
Monocytes Absolute: 0.4 10*3/uL (ref 0.1–0.9)
Monocytes: 6 %
Neutrophils Absolute: 3.6 10*3/uL (ref 1.4–7.0)
Neutrophils: 57 %
Platelets: 279 10*3/uL (ref 150–450)
RBC: 4.41 x10E6/uL (ref 3.77–5.28)
RDW: 11.8 % (ref 11.7–15.4)
WBC: 6.2 10*3/uL (ref 3.4–10.8)

## 2023-04-27 LAB — LIPID PANEL
Chol/HDL Ratio: 3.8 ratio (ref 0.0–4.4)
Cholesterol, Total: 179 mg/dL (ref 100–199)
HDL: 47 mg/dL (ref >39)
LDL Chol Calc (NIH): 113 mg/dL — ABNORMAL HIGH (ref 0–99)
Triglycerides: 105 mg/dL (ref 0–149)
VLDL Cholesterol Cal: 19 mg/dL (ref 5–40)

## 2023-04-27 LAB — TSH: TSH: 0.541 u[IU]/mL (ref 0.450–4.500)

## 2023-04-27 LAB — T4, FREE: Free T4: 1.66 ng/dL (ref 0.82–1.77)

## 2023-04-27 LAB — HEMOGLOBIN A1C
Est. average glucose Bld gHb Est-mCnc: 105 mg/dL
Hgb A1c MFr Bld: 5.3 % (ref 4.8–5.6)

## 2023-04-27 LAB — VITAMIN D 25 HYDROXY (VIT D DEFICIENCY, FRACTURES): Vit D, 25-Hydroxy: 44.7 ng/mL (ref 30.0–100.0)

## 2023-04-27 LAB — HEPATITIS C ANTIBODY: Hep C Virus Ab: NONREACTIVE

## 2023-07-03 ENCOUNTER — Ambulatory Visit: Payer: Self-pay | Admitting: *Deleted
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
## 2023-07-05 ENCOUNTER — Telehealth (INDEPENDENT_AMBULATORY_CARE_PROVIDER_SITE_OTHER): Payer: 59 | Admitting: Family Medicine

## 2023-07-05 ENCOUNTER — Encounter: Payer: Self-pay | Admitting: Family Medicine

## 2023-07-05 DIAGNOSIS — M542 Cervicalgia: Secondary | ICD-10-CM

## 2023-07-05 DIAGNOSIS — S134XXA Sprain of ligaments of cervical spine, initial encounter: Secondary | ICD-10-CM | POA: Diagnosis not present

## 2023-07-05 DIAGNOSIS — M159 Polyosteoarthritis, unspecified: Secondary | ICD-10-CM | POA: Diagnosis not present

## 2023-07-05 DIAGNOSIS — M6289 Other specified disorders of muscle: Secondary | ICD-10-CM
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
## 2023-07-06 ENCOUNTER — Encounter: Payer: Self-pay | Admitting: Family Medicine

## 2023-07-06 NOTE — Patient Instructions (Addendum)
Thank you for coming to the office today.    Please schedule a Follow-up Appointment to: Return if symptoms worsen or fail to improve.  If you have any other questions or concerns, please feel free to call the office or send a message through [place]. You may also schedule an earlier appointment if necessary.  Additionally, you may be receiving a survey about your experience at our office within a few days to 1 week by e-mail or mail. We value your feedback.  Nobie Putnam, DO [place]

## 2023-07-13 ENCOUNTER — Ambulatory Visit: Payer: 59 | Admitting: Dermatology

## 2023-07-13 ENCOUNTER — Encounter: Payer: Self-pay | Admitting: Dermatology

## 2023-07-13 VITALS — BP 125/81 | HR 85

## 2023-07-13 DIAGNOSIS — L7211 Pilar cyst: Secondary | ICD-10-CM

## 2023-07-13 DIAGNOSIS — D1801 Hemangioma of skin and subcutaneous tissue: Secondary | ICD-10-CM

## 2023-07-13 DIAGNOSIS — L739 Follicular disorder, unspecified: Secondary | ICD-10-CM

## 2023-07-13 NOTE — Patient Instructions (Signed)

## 2023-07-13 NOTE — Progress Notes (Signed)
   Follow-Up Visit   Subjective  Jeanne Velez is a 62 y.o. female who presents for the following: Patient c/o spots on her chest and posterior right thigh that are changing. Cyst on her scalp for several years seems to be growing.   The patient has spots, moles and lesions to be evaluated, some may be new or changing and the patient may have concern these could be cancer.    The following portions of the chart were reviewed this encounter and updated as appropriate: medications, allergies, medical history  Review of Systems:  No other skin or systemic complaints except as noted in HPI or Assessment and Plan.  Objective  Well appearing patient in no apparent distress; mood and affect are within normal limits.  A focused examination was performed of the following areas:face,scalp,chest,legs   Relevant physical exam findings are noted in the Assessment and Plan.    Assessment & Plan   Folliculitis Exam: pink inflamed papule with central microcrusting on right breast   Benign appearing. Favour inflamed follicle. If persistent, could be early skin cancer. Return for biopsy in 1 month if not resolved  Scalp cyst, likely PILAR CYST vs EPIDERMAL INCLUSION CYST Exam: 1.1 cm Subcutaneous nodule at the right posterior vertex scalp   Benign-appearing. Exam most consistent with a cyst. Discussed that a cyst is a benign growth that can grow over time and sometimes get irritated or inflamed. Recommend observation if it is not bothersome. Discussed option of surgical excision to remove it if it is growing, symptomatic, or other changes noted. Please call for new or changing lesions so they can be evaluated. Patient opts for excision  HEMANGIOMA Exam: red papule at the right posterior thigh Discussed benign nature. Recommend observation. Call for changes.    Return if symptoms worsen or fail to improve.  IAngelique Holm, CMA, am acting as scribe for Elie Goody, MD .    Documentation: I have reviewed the above documentation for accuracy and completeness, and I agree with the above.  Elie Goody, MD

## 2023-08-14 ENCOUNTER — Other Ambulatory Visit: Payer: Self-pay | Admitting: Dermatology

## 2023-08-20 ENCOUNTER — Encounter: Payer: Self-pay | Admitting: Family Medicine

## 2023-08-23 NOTE — Telephone Encounter (Signed)
Spoke with [facility] and they will fax documentation for the vaccinations for each patient.

## 2023-09-05 ENCOUNTER — Other Ambulatory Visit: Payer: Self-pay | Admitting: Obstetrics and Gynecology

## 2023-09-05 DIAGNOSIS — N63 Unspecified lump in unspecified breast: Secondary | ICD-10-CM

## 2023-09-05 DIAGNOSIS — Z1231 Encounter for screening mammogram for malignant neoplasm of breast: Secondary | ICD-10-CM

## 2023-09-05 DIAGNOSIS — N6310 Unspecified lump in the right breast, unspecified quadrant: Secondary | ICD-10-CM

## 2023-09-05 DIAGNOSIS — R928 Other abnormal and inconclusive findings on diagnostic imaging of breast: Secondary | ICD-10-CM

## 2023-09-06 ENCOUNTER — Encounter: Payer: PRIVATE HEALTH INSURANCE | Admitting: Dermatology

## 2023-09-08 ENCOUNTER — Telehealth: Payer: 59 | Admitting: Internal Medicine

## 2023-09-14 ENCOUNTER — Ambulatory Visit: Payer: PRIVATE HEALTH INSURANCE | Admitting: Dermatology

## 2023-09-29 ENCOUNTER — Encounter: Payer: Self-pay | Admitting: Dermatology

## 2023-10-03 ENCOUNTER — Other Ambulatory Visit: Payer: Self-pay

## 2023-10-03 MED ORDER — DOXYCYCLINE MONOHYDRATE 100 MG PO CAPS: 100.0000 mg | ORAL_CAPSULE | Freq: Two times a day (BID) | ORAL | 0 refills | Status: AC

## 2023-10-09 ENCOUNTER — Ambulatory Visit
Admission: RE | Admit: 2023-10-09 | Discharge: 2023-10-09 | Disposition: A | Discharge status: Home / Self Care | Payer: PRIVATE HEALTH INSURANCE | Source: Ambulatory Visit | Attending: Obstetrics and Gynecology | Admitting: Obstetrics and Gynecology

## 2023-10-09 ENCOUNTER — Ambulatory Visit
Admission: RE | Admit: 2023-10-09 | Discharge: 2023-10-09 | Disposition: A | Discharge status: Home / Self Care | Payer: PRIVATE HEALTH INSURANCE | Source: Ambulatory Visit | Attending: Obstetrics and Gynecology

## 2023-10-09 DIAGNOSIS — Z1231 Encounter for screening mammogram for malignant neoplasm of breast: Secondary | ICD-10-CM | POA: Insufficient documentation

## 2023-10-09 DIAGNOSIS — N63 Unspecified lump in unspecified breast: Secondary | ICD-10-CM

## 2023-10-09 DIAGNOSIS — N6310 Unspecified lump in the right breast, unspecified quadrant: Secondary | ICD-10-CM | POA: Insufficient documentation

## 2023-10-10 ENCOUNTER — Encounter: Payer: Self-pay | Admitting: Dermatology

## 2023-10-10 ENCOUNTER — Ambulatory Visit: Payer: 59 | Admitting: Dermatology

## 2023-10-10 DIAGNOSIS — Z79899 Other long term (current) drug therapy: Secondary | ICD-10-CM | POA: Diagnosis not present

## 2023-10-10 DIAGNOSIS — L700 Acne vulgaris: Secondary | ICD-10-CM | POA: Diagnosis not present

## 2023-10-10 DIAGNOSIS — Z7189 Other specified counseling: Secondary | ICD-10-CM

## 2023-10-10 MED ORDER — TRIAMCINOLONE ACETONIDE 10 MG/ML IJ SUSP
2.0000 mg | Freq: Once | INTRAMUSCULAR | Status: AC
  Administered 2023-10-10: 2 mg via INTRADERMAL

## 2023-10-10 NOTE — Progress Notes (Signed)
   Follow-Up Visit   Subjective  Jeanne Velez is a 62 y.o. female who presents for the following: painful cyst at left chin. Patient was taking doxycycline 40 mg daily but increased to 100 mg twice daily last week. She has not used her tretinoin in over a week because she had a hydra facial last week but is using hypochlorous spray and clindamycin. Patient also gets peels and uses blue light as well as having facials.   Patient did accutane about 15 years ago but she had issues with abnormal labs. Patient not able to take spironolactone due to low blood pressure already and becoming light headed easily.   The following portions of the chart were reviewed this encounter and updated as appropriate: medications, allergies, medical history  Review of Systems:  No other skin or systemic complaints except as noted in HPI or Assessment and Plan.  Objective  Well appearing patient in no apparent distress; mood and affect are within normal limits.   A focused examination was performed of the following areas: face  Relevant exam findings are noted in the Assessment and Plan.    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 Patient has had a total hysterectomy.   NDC 8295-6213-08 Lot # [id number] Exp: 01/2025     Intralesional injection - Left Chin Location: left chin  Informed Consent: Discussed risks (infection, pain, bleeding, bruising, thinning of the skin, loss of skin pigment, lack of resolution, and recurrence of lesion) and benefits of the procedure, as well as the alternatives. Informed consent was obtained. Preparation: The area was prepared a standard fashion.  Procedure Details: An intralesional injection was performed with Kenalog 2 mg/cc. Marland Kitchen05 cc in total were injected.  Total  number of injections: 1  Plan: The patient was instructed on post-op care. Recommend OTC analgesia as needed for pain.   Related Medications triamcinolone acetonide (KENALOG) 10 MG/ML injection 2 mg   Counseling and coordination of care    Return for Surgery, with Dr. Katrinka Blazing.  Anise Salvo, RMA, am acting as scribe for Elie Goody, MD .   Documentation: I have reviewed the above documentation for accuracy and completeness, and I agree with the above.  Elie Goody, MD

## 2023-10-10 NOTE — Patient Instructions (Signed)
Intralesional steroid injection side effects were reviewed including thinning of the skin and discoloration, such as redness, lightening or darkening.  Due to recent changes in healthcare laws, you may see results of your pathology and/or laboratory studies on MyChart before the doctors have had a chance to review them. We understand that in some cases there may be results that are confusing or concerning to you. Please understand that not all results are received at the same time and often the doctors may need to interpret multiple results in order to provide you with the best plan of care or course of treatment. Therefore, we ask that you please give [place] 2 business days to thoroughly review all your results before contacting the office for clarification. Should we see a critical lab result, you will be contacted sooner.   If You Need Anything After Your Visit  If you have any questions or concerns for your doctor, please call our main line at [phone] and press option 4 to reach your doctor's medical assistant. If no one answers, please leave a voicemail as directed and we will return your call as soon as possible. Messages left after 4 pm will be answered the following business day.   You may also send [place] a message via MyChart. We typically respond to MyChart messages within 1-2 business days.  For prescription refills, please ask your pharmacy to contact our office. Our fax number is [phone].  If you have an urgent issue when the clinic is closed that cannot wait until the next business day, you can page your doctor at the number below.    Please note that while we do our best to be available for urgent issues outside of office hours, we are not available 24/7.   If you have an urgent issue and are unable to reach [place], you may choose to seek medical care at your doctor's office, retail clinic, urgent care center, or emergency room.  If you have a medical emergency, please immediately  call 911 or go to the emergency department.  Pager Numbers  - Dr. Gwen Pounds: [phone]  - Dr. Roseanne Reno: [phone]  - Dr. Katrinka Blazing: [phone]   In the event of inclement weather, please call our main line at [phone] for an update on the status of any delays or closures.  Dermatology Medication Tips: Please keep the boxes that topical medications come in in order to help keep track of the instructions about where and how to use these. Pharmacies typically print the medication instructions only on the boxes and not directly on the medication tubes.   If your medication is too expensive, please contact our office at [phone] option 4 or send [place] a message through MyChart.   We are unable to tell what your co-pay for medications will be in advance as this is different depending on your insurance coverage. However, we may be able to find a substitute medication at lower cost or fill out paperwork to get insurance to cover a needed medication.   If a prior authorization is required to get your medication covered by your insurance company, please allow [place] 1-2 business days to complete this process.  Drug prices often vary depending on where the prescription is filled and some pharmacies may offer cheaper prices.  The website www.goodrx.com contains coupons for medications through different pharmacies. The prices here do not account for what the cost may be with help from insurance (it may be cheaper with your insurance), but the website can give  you the price if you did not use any insurance.  - You can print the associated coupon and take it with your prescription to the pharmacy.  - You may also stop by our office during regular business hours and pick up a GoodRx coupon card.  - If you need your prescription sent electronically to a different pharmacy, notify our office through [facility] or by phone at [phone] option 4.     Si Usted Necesita Algo Despus de Su  Visita  Tambin puede enviarnos un mensaje a travs de Clinical cytogeneticist. Por lo general respondemos a los mensajes de MyChart en el transcurso de 1 a 2 das hbiles.  Para renovar recetas, por favor pida a su farmacia que se ponga en contacto con nuestra oficina. Annie Sable de fax es [place] [phone].  Si tiene un asunto urgente cuando la clnica est cerrada y que no puede esperar hasta el siguiente da hbil, puede llamar/localizar a su doctor(a) al nmero que aparece a continuacin.   Por favor, tenga en cuenta que aunque hacemos todo lo posible para estar disponibles para asuntos urgentes fuera del horario de [place], no estamos disponibles las 24 horas del da, los 7 [address]  [address] de la [place].   Si tiene un problema urgente y no puede comunicarse con nosotros, puede optar por buscar atencin mdica  en el consultorio de su doctor(a), en una clnica privada, en un centro de atencin urgente o en una sala de emergencias.  Si tiene Engineer, drilling, por favor llame inmediatamente al 911 o vaya a la sala de emergencias.  Nmeros de bper  - Dr. Gwen Pounds: [phone]  - Dra. Roseanne Reno: [phone]  - Dr. Katrinka Blazing: [phone]   En caso de inclemencias del tiempo, por favor llame a Lacy Duverney principal al [phone] para una actualizacin sobre el [place] de cualquier retraso o cierre.  Consejos para la medicacin en dermatologa: Por favor, guarde las cajas en las que vienen los medicamentos de uso tpico para ayudarle a seguir las instrucciones sobre dnde y cmo usarlos. Las farmacias generalmente imprimen las instrucciones del medicamento slo en las cajas y no directamente en los tubos del [place].   Si su medicamento es muy caro, por favor, pngase en contacto con Rolm [place]ala llamando al [phone] y presione la opcin 4 o envenos un mensaje a travs de Clinical cytogeneticist.   No podemos decirle cul ser su copago por los medicamentos por adelantado ya que esto es diferente dependiendo de  la cobertura de su seguro. Sin embargo, es posible que podamos encontrar un medicamento sustituto a Audiological scientist un formulario para que el seguro cubra el medicamento que se considera necesario.   Si se requiere una autorizacin previa para que su compaa de seguros [place] su medicamento, por favor permtanos de 1 a 2 das hbiles para completar [address]  Los precios de los medicamentos varan con frecuencia dependiendo del Environmental consultant de dnde se surte la receta y alguna farmacias pueden ofrecer precios ms baratos.  El sitio web www.goodrx.com tiene cupones para medicamentos de Health and safety inspector. Los precios aqu no tienen en cuenta lo que podra costar con la ayuda del seguro (puede ser ms barato con su seguro), pero el sitio web puede darle el precio si no utiliz Tourist information centre manager.  - Puede imprimir el cupn correspondiente y llevarlo con su receta a la farmacia.  - Tambin puede pasar por nuestra oficina durante el horario de atencin regular y Education officer, museum una tarjeta de cupones de GoodRx.  -  Si necesita que su receta se enve electrnicamente a Psychiatrist, informe a nuestra oficina a travs de MyChart de [place] o por telfono llamando al [phone] y presione la opcin 4.

## 2023-10-18 ENCOUNTER — Ambulatory Visit: Payer: PRIVATE HEALTH INSURANCE | Admitting: Dermatology

## 2023-10-18 ENCOUNTER — Encounter: Payer: Self-pay | Admitting: Dermatology

## 2023-10-18 DIAGNOSIS — L7211 Pilar cyst: Secondary | ICD-10-CM | POA: Diagnosis not present

## 2023-10-18 NOTE — Patient Instructions (Addendum)

## 2023-10-18 NOTE — Progress Notes (Signed)
   Follow-Up Visit   Subjective  Jeanne Velez is a 62 y.o. female who presents for the following: Excision of pilar cyst at scalp  The following portions of the chart were reviewed this encounter and updated as appropriate: medications, allergies, medical history  Review of Systems:  No other skin or systemic complaints except as noted in HPI or Assessment and Plan.  Objective  Well appearing patient in no apparent distress; mood and affect are within normal limits.  A focused examination was performed of the following areas: scalp Relevant physical exam findings are noted in the Assessment and Plan.   right posterior vertex scalp  right posterior vertex scalp 1.8 cm Subcutaneous nodule     Assessment & Plan   Pilar cyst right posterior vertex scalp  Skin excision - right posterior vertex scalp  Total excision diameter (cm):  1.8 Informed consent: discussed and consent obtained   Timeout: patient name, date of birth, surgical site, and procedure verified   Procedure prep:  Patient was prepped and draped in usual sterile fashion Prep type:  Chlorhexidine Anesthesia: the lesion was anesthetized in a standard fashion   Anesthetic:  1% lidocaine w/ epinephrine 1-100,000 buffered w/ 8.4% NaHCO3 (6 cc lido w/epi, 1 cc bupivicaine) Instrument used: #15 blade   Hemostasis achieved with: pressure   Outcome: patient tolerated procedure well with no complications    Skin repair - right posterior vertex scalp Complexity:  Intermediate Final length (cm):  4 Informed consent: discussed and consent obtained   Timeout: patient name, date of birth, surgical site, and procedure verified   Procedure prep:  Patient was prepped and draped in usual sterile fashion Prep type:  Chlorhexidine Anesthesia: the lesion was anesthetized in a standard fashion   Anesthetic:  1% lidocaine w/ epinephrine 1-100,000 buffered w/ 8.4% NaHCO3 Reason for type of repair: reduce tension to allow closure,  reduce the risk of dehiscence, infection, and necrosis, reduce subcutaneous dead space and avoid a hematoma, allow closure of the large defect and preserve normal anatomy   Undermining: edges could be approximated without difficulty   Subcutaneous layers (deep stitches):  Suture size:  3-0 Suture type: Monocryl (poliglecaprone 25)   Stitches:  Buried vertical mattress Fine/surface layer approximation (top stitches):  Suture size:  4-0 Suture type: Prolene (polypropylene)   Stitches: simple running   Suture removal (days):  7 Hemostasis achieved with: suture, pressure and electrodesiccation Outcome: patient tolerated procedure well with no complications   Post-procedure details: sterile dressing applied and wound care instructions given   Dressing type: petrolatum, bandage and pressure dressing    Specimen 1 - Surgical pathology Differential Diagnosis: Pilar Cyst vs EIC  Check Margins: No 1.8 cm Subcutaneous nodule     Return for Suture Removal, with Dr. Katrinka Blazing, as scheduled.  Anise Salvo, RMA, am acting as scribe for Elie Goody, MD .   Documentation: I have reviewed the above documentation for accuracy and completeness, and I agree with the above.  Elie Goody, MD

## 2023-10-19 ENCOUNTER — Telehealth: Payer: Self-pay

## 2023-10-19 NOTE — Telephone Encounter (Signed)
Patient had some discomfort following yesterday's surgery, took ibuprofen and tylenol which helped. She will let us know if she has any issues or concerns. Butch Penny., RMA

## 2023-10-23 ENCOUNTER — Telehealth: Payer: Self-pay

## 2023-10-23 DIAGNOSIS — L719 Rosacea, unspecified: Secondary | ICD-10-CM

## 2023-10-23 LAB — SURGICAL PATHOLOGY

## 2023-10-23 NOTE — Telephone Encounter (Signed)
Prescription refill request received from pharmacy for Doxycycline 40 mg po QD.Ok to RF?

## 2023-10-25 ENCOUNTER — Encounter: Payer: Self-pay | Admitting: Dermatology

## 2023-10-25 ENCOUNTER — Ambulatory Visit: Payer: PRIVATE HEALTH INSURANCE | Admitting: Dermatology

## 2023-10-25 DIAGNOSIS — Z5189 Encounter for other specified aftercare: Secondary | ICD-10-CM

## 2023-10-25 DIAGNOSIS — Z48817 Encounter for surgical aftercare following surgery on the skin and subcutaneous tissue: Secondary | ICD-10-CM

## 2023-10-25 DIAGNOSIS — Z4802 Encounter for removal of sutures: Secondary | ICD-10-CM

## 2023-10-25 MED ORDER — DOXYCYCLINE 40 MG PO CPDR: DELAYED_RELEASE_CAPSULE | ORAL | 11 refills | Status: DC

## 2023-10-25 NOTE — Patient Instructions (Addendum)
Phone: [phone]  [email or web address]   Your prescription was sent to [facility] in [place]. A representative from [employer] will contact you within 2 business hours to verify your address and insurance information to schedule a free delivery. If for any reason you do not receive a phone call from them, please reach out to them. Their phone number is [phone] and their hours are Monday-Friday 9:00 am-5:00 pm.     Due to recent changes in healthcare laws, you may see results of your pathology and/or laboratory studies on MyChart before the doctors have had a chance to review them. We understand that in some cases there may be results that are confusing or concerning to you. Please understand that not all results are received at the same time and often the doctors may need to interpret multiple results in order to provide you with the best plan of care or course of treatment. Therefore, we ask that you please give [place] 2 business days to thoroughly review all your results before contacting the office for clarification. Should we see a critical lab result, you will be contacted sooner.   If You Need Anything After Your Visit  If you have any questions or concerns for your doctor, please call our main line at [phone] and press option 4 to reach your doctor's medical assistant. If no one answers, please leave a voicemail as directed and we will return your call as soon as possible. Messages left after 4 pm will be answered the following business day.   You may also send [place] a message via MyChart. We typically respond to MyChart messages within 1-2 business days.  For prescription refills, please ask your pharmacy to contact our office. Our fax number is [phone].  If you have an urgent issue when the clinic is closed that cannot wait until the next business day, you can page your doctor at the number below.    Please note that while we do our best to be available for  urgent issues outside of office hours, we are not available 24/7.   If you have an urgent issue and are unable to reach [place], you may choose to seek medical care at your doctor's office, retail clinic, urgent care center, or emergency room.  If you have a medical emergency, please immediately call 911 or go to the emergency department.  Pager Numbers  - Dr. Gwen Pounds: [phone]  - Dr. Roseanne Reno: [phone]  - Dr. Katrinka Blazing: [phone]   In the event of inclement weather, please call our main line at [phone] for an update on the status of any delays or closures.  Dermatology Medication Tips: Please keep the boxes that topical medications come in in order to help keep track of the instructions about where and how to use these. Pharmacies typically print the medication instructions only on the boxes and not directly on the medication tubes.   If your medication is too expensive, please contact our office at [phone] option 4 or send [place] a message through MyChart.   We are unable to tell what your co-pay for medications will be in advance as this is different depending on your insurance coverage. However, we may be able to find a substitute medication at lower cost or fill out paperwork to get insurance to cover a needed medication.   If a prior authorization is required to get your medication covered by your insurance company, please allow [place] 1-2 business days to complete this process.  Drug prices often vary  depending on where the prescription is filled and some pharmacies may offer cheaper prices.  The website www.goodrx.com contains coupons for medications through different pharmacies. The prices here do not account for what the cost may be with help from insurance (it may be cheaper with your insurance), but the website can give you the price if you did not use any insurance.  - You can print the associated coupon and take it with your prescription to the pharmacy.  - You may also  stop by our office during regular business hours and pick up a GoodRx coupon card.  - If you need your prescription sent electronically to a different pharmacy, notify our office through [facility] or by phone at [phone] option 4.     Si Usted Necesita Algo Despus de Su Visita  Tambin puede enviarnos un mensaje a travs de Clinical cytogeneticist. Por lo general respondemos a los mensajes de MyChart en el transcurso de 1 a 2 das hbiles.  Para renovar recetas, por favor pida a su farmacia que se ponga en contacto con nuestra oficina. Annie Sable de fax es [place] [phone].  Si tiene un asunto urgente cuando la clnica est cerrada y que no puede esperar hasta el siguiente da hbil, puede llamar/localizar a su doctor(a) al nmero que aparece a continuacin.   Por favor, tenga en cuenta que aunque hacemos todo lo posible para estar disponibles para asuntos urgentes fuera del horario de [place], no estamos disponibles las 24 horas del da, los 7 [address]  [address] de la [place].   Si tiene un problema urgente y no puede comunicarse con nosotros, puede optar por buscar atencin mdica  en el consultorio de su doctor(a), en una clnica privada, en un centro de atencin urgente o en una sala de emergencias.  Si tiene Engineer, drilling, por favor llame inmediatamente al 911 o vaya a la sala de emergencias.  Nmeros de bper  - Dr. Gwen Pounds: [phone]  - Dra. Roseanne Reno: [phone]  - Dr. Katrinka Blazing: [phone]   En caso de inclemencias del tiempo, por favor llame a Lacy Duverney principal al [phone] para una actualizacin sobre el [place] de cualquier retraso o cierre.  Consejos para la medicacin en dermatologa: Por favor, guarde las cajas en las que vienen los medicamentos de uso tpico para ayudarle a seguir las instrucciones sobre dnde y cmo usarlos. Las farmacias generalmente imprimen las instrucciones del medicamento slo en las cajas y no directamente en los tubos del [place].    Si su medicamento es muy caro, por favor, pngase en contacto con Rolm Gala llamando al [phone] y presione la opcin 4 o envenos un mensaje a travs de Clinical cytogeneticist.   No podemos decirle cul ser su copago por los medicamentos por adelantado ya que esto es diferente dependiendo de la cobertura de su seguro. Sin embargo, es posible que podamos encontrar un medicamento sustituto a Audiological scientist un formulario para que el seguro cubra el medicamento que se considera necesario.   Si se requiere una autorizacin previa para que su compaa de seguros [place] su medicamento, por favor permtanos de 1 a 2 das hbiles para completar [address]  Los precios de los medicamentos varan con frecuencia dependiendo del Environmental consultant de dnde se surte la receta y alguna farmacias pueden ofrecer precios ms baratos.  El sitio web www.goodrx.com tiene cupones para medicamentos de Health and safety inspector. Los precios aqu no tienen en cuenta lo que podra costar con la ayuda del seguro (puede ser ms barato con su  seguro), pero el sitio web puede darle el precio si no Visual merchandiser.  - Puede imprimir el cupn correspondiente y llevarlo con su receta a la farmacia.  - Tambin puede pasar por nuestra oficina durante el horario de atencin regular y Education officer, museum una tarjeta de cupones de GoodRx.  - Si necesita que su receta se enve electrnicamente a una farmacia diferente, informe a nuestra oficina a travs de MyChart de [place] o por telfono llamando al [phone] y presione la opcin 4.

## 2023-10-25 NOTE — Progress Notes (Signed)
   Follow-Up Visit   Subjective  Jeanne Velez is a 62 y.o. female who presents for the following: Suture removal  Pathology showed pilar cyst  The following portions of the chart were reviewed this encounter and updated as appropriate: medications, allergies, medical history  Review of Systems:  No other skin or systemic complaints except as noted in HPI or Assessment and Plan.  Objective  Well appearing patient in no apparent distress; mood and affect are within normal limits.  Areas Examined: scalp Relevant physical exam findings are noted in the Assessment and Plan.    Assessment & Plan   Sent message to Dr Gershon Crane re: starting isotretinoin. Patient will switch provider to me in ipledge and we will send in first month before next appointment.   Encounter for Removal of Sutures - Incision site is clean, dry and intact. - Wound cleansed, sutures removed, wound cleansed and steri strips applied.  - Discussed pathology results showing pilar cyst - Patient advised to keep steri-strips dry until they fall off. - Scars remodel for a full year. - Once steri-strips fall off, patient can apply over-the-counter silicone scar cream once to twice a day to help with scar remodeling if desired. - Patient advised to call with any concerns or if they notice any new or changing lesions.  Return for TBSE, as scheduled.  Anise Salvo, RMA, am acting as scribe for Elie Goody, MD .   Documentation: I have reviewed the above documentation for accuracy and completeness, and I agree with the above.  Elie Goody, MD

## 2023-11-12 ENCOUNTER — Telehealth: Payer: Self-pay | Admitting: Dermatology

## 2023-11-12 DIAGNOSIS — L700 Acne vulgaris: Secondary | ICD-10-CM

## 2023-11-12 NOTE — Telephone Encounter (Signed)
Ipledge [phone]

## 2023-11-13 MED ORDER — ISOTRETINOIN 20 MG PO CAPS: 20.0000 mg | ORAL_CAPSULE | Freq: Every day | ORAL | 0 refills | Status: DC

## 2023-11-13 NOTE — Telephone Encounter (Signed)
iPledge# [id number]

## 2023-11-21 NOTE — Progress Notes (Signed)
 PCP: Edman Marsa PARAS, DO   Chief Complaint  Patient presents with   Gynecologic Exam    Acne and no sex drive     HPI:      [name] is a 63 y.o. No obstetric history on file. whose LMP was No LMP recorded. Patient has had a hysterectomy., presents today for her annual examination.  Her menses are absent due to TAH/BSO due to bilat dermoids/ovar cysts. She doesn't have hot flashes and night sweats are improved with cessation of sugar; currently on estradiol  0.5 mg 3 times weekly with sx control, wants to continue.  Sex activity: not currently sex active, contraception - status post hysterectomy; but would like to try again with husband. Has significant decreased libido, not related to vaginal pain. Did have vag dryness, improved with lubricants in past. Pt on oral ERT, no recent vag ERT. Had muscular vag pain with certain movements and sex 7/22; did vag ERT samples for 8 nights and restarted kegels and sx improved.    Last Pap: not recent; s/p hyst. No hx of abn paps. Hx of STDs: HSV  Vulvar abscess 5/24 treated with abx.  Is seeing derm for acne. Treated with rosacea meds in past with sx control but sx have flared past 9-12 months. Pt very compliant and has good face care regimen.   Last mammogram: 10/09/23 Results were: normal--repeat in 12 months. 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 Pt states it's deep and not palpable, but does ache sometimes (denies caffeine use, no change with ERT dose). Hx of other RT breast mass in past with breast MRIs and neg breast bx.   There is a FH of breast cancer in her MGM and pat cousin, as well as melanoma in her mat aunt, genetic testing done. There is no FH of ovarian cancer. Pt is MyRisk neg 9/21; IBIS=15.1%/riskscore=6.7%. The patient does do self-breast exams.  Colonoscopy: 1/24 with polyps with Dr. Unk, repeat due after 5 yrs. 2020 with polyps,  Repeat due after 3  yrs.   DEXA: a few yrs ago, normal spine and hip.  Tobacco use: The patient denies current or previous tobacco use. Alcohol use: rare No drug use Exercise: moderately active  She does get adequate calcium and Vitamin D  in her diet. Hx of Vit D deficiency   Labs with PCP.   Past Medical History:  Diagnosis Date   Acne    Allergy    Anemia    Back pain    BRCA negative 07/2020   MyRisk neg   Cataracts, bilateral    Colon polyp    Constipation    Family history of breast cancer 07/2020   IBIS=15.1%/riskscore=6.7%   Family history of skin cancer 02/24/2022   Folliculitis    GERD (gastroesophageal reflux disease)    Hyperlipidemia    Hypothyroidism    Joint pain    Macular degeneration    Osteoarthritis    Retinal vasculitis    Stress    Thyroid cancer (HCC)     Past Surgical History:  Procedure Laterality Date   ABDOMINAL HYSTERECTOMY     BREAST BIOPSY Right    yrs ago benign, no marker   COLONOSCOPY WITH PROPOFOL  N/A 12/14/2022   Procedure: COLONOSCOPY WITH PROPOFOL ;  Surgeon: Unk Corinn Skiff, MD;  Location: [facility] ENDOSCOPY;  Service: Gastroenterology;  Laterality: N/A;   FLEXIBLE SIGMOIDOSCOPY N/A 04/28/2021   Procedure: FLEXIBLE SIGMOIDOSCOPY;  Surgeon: Unk Corinn Skiff, MD;  Location: [facility] ENDOSCOPY;  Service: Gastroenterology;  Laterality: N/A;   ovarial cystectomy     THYROIDECTOMY     TOTAL ABDOMINAL HYSTERECTOMY W/ BILATERAL SALPINGOOPHORECTOMY     dermoids, ovar cysts   TUBAL LIGATION      Family History  Problem Relation Age of Onset   Myasthenia gravis Mother    Heart disease Father    Depression Father    Skin cancer Father        dx 44s; face/scalp   Anxiety disorder Father    Obesity Father    Bone cancer Maternal Aunt 60   Melanoma Maternal Aunt        dx 67s; bottom of foot   Lung cancer Maternal Aunt    Breast cancer Maternal Grandmother        dx unknown age   Skin cancer Maternal Grandmother        dx unknown age   Breast  cancer Cousin        dx 71s; paternal female cousin   Melanoma Cousin        dx 24s; paternal female cousin    Social History   Socioeconomic History   Marital status: Married    Spouse name: Not on file   Number of children: Not on file   Years of education: Not on file   Highest education level: Not on file  Occupational History   Occupation: Recreational therapist  Tobacco Use   Smoking status: Never   Smokeless tobacco: Never  Vaping Use   Vaping status: Never Used  Substance and Sexual Activity   Alcohol use: Yes    Comment: rarely   Drug use: Never   Sexual activity: Yes    Birth control/protection: Surgical    Comment: Hysterectomy  Other Topics Concern   Not on file  Social History Narrative   Not on file   Social Drivers of Health   Financial Resource Strain: Not on file  Food Insecurity: Not on file  Transportation Needs: Not on file  Physical Activity: Not on file  Stress: Not on file  Social Connections: Not on file  Intimate Partner Violence: Not on file     Current Outpatient Medications:    acetaminophen (TYLENOL) 325 MG tablet, Take 650 mg by mouth every 6 (six) hours as needed., Disp: , Rfl:    calcium-vitamin D  (OSCAL WITH D) 500-200 MG-UNIT tablet, Take 1 tablet by mouth., Disp: , Rfl:    Clascoterone  (WINLEVI ) 1 % CREA, Apply to face once daily for acne, Disp: 60 g, Rfl: 2   clindamycin  (CLINDAGEL) 1 % gel, Apply 1 Application topically daily., Disp: 30 g, Rfl: 5   fluticasone  (FLONASE ) 50 MCG/ACT nasal spray, Place 2 sprays into both nostrils daily. Use for 4-6 weeks then stop and use seasonally or as needed., Disp: 16 g, Rfl: 3   levothyroxine (SYNTHROID) 125 MCG tablet, Take 125 mcg by mouth daily before breakfast., Disp: , Rfl:    loratadine (CLARITIN) 10 MG tablet, Take 10 mg by mouth daily., Disp: , Rfl:    Multiple Vitamins-Minerals (PRESERVISION AREDS 2 PO), Take 1 capsule by mouth 2 (two) times daily. , Disp: , Rfl:    NON FORMULARY,  Hypochlorous spray on face and back 2x daily, Disp: , Rfl:    Probiotic Product (PROBIOTIC-10 PO), Take by mouth., Disp: , Rfl:    ZEPBOUND 5 MG/0.5ML Pen, INJECT THE CONTENTS OF ONE SYRINGE UNDER THE SKIN ONCE WEEKLY,  Disp: , Rfl:    estradiol  (ESTRACE ) 0.5 MG tablet, Take 1 tablet (0.5 mg total) by mouth daily., Disp: 90 tablet, Rfl: 1     ROS:  Review of Systems  Constitutional:  Negative for fatigue, fever and unexpected weight change.  Respiratory:  Negative for cough, shortness of breath and wheezing.   Cardiovascular:  Negative for chest pain, palpitations and leg swelling.  Gastrointestinal:  Negative for blood in stool, constipation, diarrhea, nausea and vomiting.  Endocrine: Negative for cold intolerance, heat intolerance and polyuria.  Genitourinary:  Negative for dyspareunia, dysuria, flank pain, frequency, genital sores, hematuria, menstrual problem, pelvic pain, urgency, vaginal bleeding, vaginal discharge and vaginal pain.  Musculoskeletal:  Negative for back pain, joint swelling and myalgias.  Skin:  Negative for rash.  Neurological:  Negative for dizziness, syncope, light-headedness, numbness and headaches.  Hematological:  Negative for adenopathy.  Psychiatric/Behavioral:  Negative for agitation, confusion, sleep disturbance and suicidal ideas. The patient is not nervous/anxious.     Objective: BP 108/69   Pulse (!) 106   Ht 5' 8 (1.727 m)   Wt 186 lb (84.4 kg)   BMI 28.28 kg/m    Physical Exam Constitutional:      Appearance: She is well-developed.  Genitourinary:     Vulva normal.     Genitourinary Comments: UTERUS/CX SURG REM     Right Labia: No rash, tenderness or lesions.    Left Labia: No tenderness, lesions or rash.    Vaginal cuff intact.    No vaginal discharge, erythema or tenderness.      Right Adnexa: not tender and no mass present.    Left Adnexa: not tender and no mass present.    Cervix is absent.     Uterus is absent.  Breasts:     Right: No mass, nipple discharge, skin change or tenderness.     Left: No mass, nipple discharge, skin change or tenderness.  Neck:     Thyroid: No thyromegaly.  Cardiovascular:     Rate and Rhythm: Normal rate and regular rhythm.     Heart sounds: Normal heart sounds. No murmur heard. Pulmonary:     Effort: Pulmonary effort is normal.     Breath sounds: Normal breath sounds.  Abdominal:     Palpations: Abdomen is soft.     Tenderness: There is no abdominal tenderness. There is no guarding.  Musculoskeletal:        General: Normal range of motion.     Cervical back: Normal range of motion.  Neurological:     General: No focal deficit present.     Mental Status: She is alert and oriented to person, place, and time.     Cranial Nerves: No cranial nerve deficit.  Skin:    General: Skin is warm and dry.  Psychiatric:        Mood and Affect: Mood normal.        Behavior: Behavior normal.        Thought Content: Thought content normal.        Judgment: Judgment normal.  Vitals reviewed.     Assessment/Plan:  Encounter for annual routine gynecological examination  Encounter for screening mammogram for malignant neoplasm of breast; pt current on mammo  Vasomotor symptoms due to menopause - Plan: estradiol  (ESTRACE ) 0.5 MG tablet; sx improved, Rx RF eRxd. F/u prn.   Hormone replacement therapy (HRT) - Plan: estradiol  (ESTRACE ) 0.5 MG tablet  Decreased libido--discussed treating vag pain/dryness if an issue if becomes  sexually active; no great tx for decreased libido unfortunately   Meds ordered this encounter  Medications   estradiol  (ESTRACE ) 0.5 MG tablet    Sig: Take 1 tablet (0.5 mg total) by mouth daily.    Dispense:  90 tablet    Refill:  1    Supervising Provider:   CHERRY, ANIKA [[id number]]           GYN counsel breast self exam, mammography screening, use and side effects of HRT, menopause, adequate intake of calcium and vitamin D , diet and exercise    F/U  Return  in about 1 year (around 11/22/2024).  Amna Welker B. Elois Averitt, PA-C 11/23/2023 10:30 AM

## 2023-11-22 ENCOUNTER — Telehealth: Payer: Self-pay

## 2023-11-22 NOTE — Telephone Encounter (Signed)
 Patient left a voicemail stating that she started Isotretinoin  yesterday, and is experiencing a headache which worsened overnight. She also c/o dizziness. Patient currently using OTC Ibuprofen for pain. She is concerned because she read that if she experienced headache while starting Isotretinoin  to contact provider immediately. Please advise.

## 2023-11-22 NOTE — Telephone Encounter (Addendum)
 I spoke with patient and she said she stopped Doxycycline  at least 24 hours before starting Isotretinoin , but she was unsure if she took her last Doxycycline  on Sunday or Monday. I advised patient to discontinue Isotretinoin  for now and to contact us  tomorrow to report on condition. I advised patient that if headache or dizziness worsened to seek medical attention as her symptoms may be unrelated to Isotretinoin , and caused by something else.

## 2023-11-23 ENCOUNTER — Other Ambulatory Visit: Payer: Self-pay

## 2023-11-23 ENCOUNTER — Encounter: Payer: Self-pay | Admitting: Dermatology

## 2023-11-23 ENCOUNTER — Ambulatory Visit (INDEPENDENT_AMBULATORY_CARE_PROVIDER_SITE_OTHER): Payer: PRIVATE HEALTH INSURANCE | Admitting: Obstetrics and Gynecology

## 2023-11-23 ENCOUNTER — Encounter: Payer: Self-pay | Admitting: Obstetrics and Gynecology

## 2023-11-23 VITALS — BP 108/69 | HR 106 | Ht 68.0 in | Wt 186.0 lb

## 2023-11-23 DIAGNOSIS — N764 Abscess of vulva: Secondary | ICD-10-CM

## 2023-11-23 DIAGNOSIS — Z1211 Encounter for screening for malignant neoplasm of colon: Secondary | ICD-10-CM

## 2023-11-23 DIAGNOSIS — Z1231 Encounter for screening mammogram for malignant neoplasm of breast: Secondary | ICD-10-CM

## 2023-11-23 DIAGNOSIS — Z79890 Hormone replacement therapy (postmenopausal): Secondary | ICD-10-CM

## 2023-11-23 DIAGNOSIS — Z01419 Encounter for gynecological examination (general) (routine) without abnormal findings: Secondary | ICD-10-CM

## 2023-11-23 DIAGNOSIS — R6882 Decreased libido: Secondary | ICD-10-CM

## 2023-11-23 DIAGNOSIS — N951 Menopausal and female climacteric states: Secondary | ICD-10-CM

## 2023-11-23 MED ORDER — WINLEVI 1 % EX CREA: TOPICAL_CREAM | CUTANEOUS | 2 refills | Status: DC

## 2023-11-23 MED ORDER — ESTRADIOL 0.5 MG PO TABS: 0.5000 mg | ORAL_TABLET | Freq: Every day | ORAL | 1 refills | Status: DC

## 2023-11-23 NOTE — Patient Instructions (Signed)
 I value your feedback and you entrusting [place] with your care. If you get a [place] patient survey, I would appreciate you taking the time to let us know about your experience today. Thank you! ? ? ?

## 2023-11-23 NOTE — Progress Notes (Signed)
 Winlevi cream ERX to [employer]. Patient advised via MyChart message.

## 2023-11-28 ENCOUNTER — Encounter: Payer: Self-pay | Admitting: Dermatology

## 2023-12-11 ENCOUNTER — Encounter: Payer: Self-pay | Admitting: Family Medicine

## 2023-12-11 ENCOUNTER — Ambulatory Visit: Payer: PRIVATE HEALTH INSURANCE | Admitting: Family Medicine

## 2023-12-11 VITALS — BP 108/62 | HR 102 | Ht 68.0 in | Wt 185.0 lb

## 2023-12-11 DIAGNOSIS — M67471 Ganglion, right ankle and foot: Secondary | ICD-10-CM | POA: Diagnosis not present

## 2023-12-11 DIAGNOSIS — M778 Other enthesopathies, not elsewhere classified: Secondary | ICD-10-CM

## 2023-12-11 NOTE — Patient Instructions (Addendum)
Thank you for coming to the office today.  Recommend corn / toe circular pads, to off load the cyst or lesion on the toe. If continued progression or pain or limited mobility, we can refer to Podiatry for a 2nd opinion  Right Shoulder Likely a very localized bursitis / tendonitis. Possibly biceps tendonitis. Rotator cuff muscles are intact without any issue or localized damage. Mostly focus on resting the shoulder from particular activities / lifting rotation etc that trigger it.  Please schedule a Follow-up Appointment to: Return if symptoms worsen or fail to improve.  If you have any other questions or concerns, please feel free to call the office or send a message through MyChart. You may also schedule an earlier appointment if necessary.  Additionally, you may be receiving a survey about your experience at our office within a few days to 1 week by e-mail or mail. We value your feedback.  Saralyn Pilar, DO [facility], [place]

## 2023-12-11 NOTE — Progress Notes (Signed)
Subjective:    Patient ID: Jeanne Velez, female    DOB: [birth year], 63 y.o.   MRN: [id number]  Jeanne Velez is a 63 y.o. female presenting on 12/11/2023 for Toe problem, cyst and Shoulder Pain   HPI  Discussed the use of AI scribe software for clinical note transcription with the patient, who gave verbal consent to proceed.  History of Present Illness    R Shoulder Pain, chronic she reported a progressive right shoulder discomfort, not associated with any specific injury or trauma. The discomfort was noted to be absent in a neutral resting position but was triggered by certain ranges of motion, particularly rotation. The pain was described as radiating down the back of the shoulder. Despite the discomfort, the patient continued her regular gym activities, avoiding exercises that provoked the pain. Worse with rotation of shoulder and arm. Otherwise lifting seems intact  Right Great Toe Cyst or Nodular Abnormality Secondly, the patient reported a recent discovery of a soft, fluid-filled lesion on the bottom of her right foot. The lesion was not associated with any pain, but the patient reported a slight modification in her gait due to discomfort when pressure was applied. The lesion was suspected to have possibly started as a blister, but no clear point of injury was identified. The patient's history of growing benign cysts was noted.  Additionally, the patient mentioned a previous surgical removal of a large cyst from her scalp, which was still in the process of healing. The patient reported no open wound, bleeding, oozing, or inflammation at the surgical site.        12/11/2023    9:40 AM 04/25/2023    8:49 AM 06/20/2022    3:36 PM  Depression screen PHQ 2/9  Decreased Interest 0 0 0  Down, Depressed, Hopeless 0 0 0  PHQ - 2 Score 0 0 0  Altered sleeping  0 0  Tired, decreased energy  0 0  Change in appetite  0 0  Feeling bad or failure about yourself   0 0  Trouble concentrating  0  0  Moving slowly or fidgety/restless  0 0  Suicidal thoughts  0 0  PHQ-9 Score  0 0  Difficult doing work/chores  Not difficult at all Not difficult at all       12/11/2023    9:40 AM 04/25/2023    8:49 AM 06/20/2022    3:36 PM 03/11/2022   10:36 AM  GAD 7 : Generalized Anxiety Score  Nervous, Anxious, on Edge 1 0 1 1  Control/stop worrying 0 0 0 0  Worry too much - different things 0 0 0 0  Trouble relaxing 1 0 2 2  Restless 0 0 0 0  Easily annoyed or irritable 1 0 2 2  Afraid - awful might happen 0 0 0 0  Total GAD 7 Score 3 0 5 5  Anxiety Difficulty Not difficult at all Not difficult at all Not difficult at all Not difficult at all    Social History   Tobacco Use   Smoking status: Never   Smokeless tobacco: Never  Vaping Use   Vaping status: Never Used  Substance Use Topics   Alcohol use: Yes    Comment: rarely   Drug use: Never    Review of Systems Per HPI unless specifically indicated above     Objective:    BP 108/62   Pulse (!) 102   Ht 5\' 8"  (1.727 m)   Wt 185 lb (  83.9 kg)   SpO2 98%   BMI 28.13 kg/m   Wt Readings from Last 3 Encounters:  12/11/23 185 lb (83.9 kg)  11/23/23 186 lb (84.4 kg)  04/25/23 212 lb (96.2 kg)    Physical Exam Vitals and nursing note reviewed.  Constitutional:      General: She is not in acute distress.    Appearance: Normal appearance. She is well-developed. She is not diaphoretic.     Comments: Well-appearing, comfortable, cooperative  HENT:     Head: Normocephalic and atraumatic.  Eyes:     General:        Right eye: No discharge.        Left eye: No discharge.     Conjunctiva/sclera: Conjunctivae normal.  Cardiovascular:     Rate and Rhythm: Normal rate.  Pulmonary:     Effort: Pulmonary effort is normal.  Musculoskeletal:     Comments: RIGHT Shoulder Inspection: Normal appearance bilateral symmetrical Palpation: Non-tender to palpation over anterior, lateral, or posterior shoulder  ROM: Full intact active ROM  forward flexion, abduction, internal / external rotation, symmetrical Special Testing: Rotator cuff testing negative for weakness with supraspinatus. Impingement positive for mild pain but not significant. Seems most provoked pain is on supination of arm Strength: Normal strength 5/5 flex/ext, ext rot / int rot, grip, rotator cuff str testing. Neurovascular: Distally intact pulses, sensation to light touch   R Foot / Toe Plantar surface R great toe with deeper fluid filled vs nodular cyst. No abnormality on surface of skin. No disruption of skin. No ulceration or plantar wart.  Skin:    General: Skin is warm and dry.     Findings: No erythema or rash.  Neurological:     Mental Status: She is alert and oriented to person, place, and time.  Psychiatric:        Mood and Affect: Mood normal.        Behavior: Behavior normal.        Thought Content: Thought content normal.     Comments: Well groomed, good eye contact, normal speech and thoughts     Results for orders placed or performed in visit on 10/18/23  Surgical pathology   Collection Time: 10/18/23 12:00 AM  Result Value Ref Range   SURGICAL PATHOLOGY      SURGICAL PATHOLOGY [facility] [address] [place], [place] [address] Telephone [phone] or [phone] Fax [phone]  REPORT OF DERMATOPATHOLOGY   Accession #: [id number] Patient Name: Jeanne Velez Visit # : [id number]  MRN: [id number] Cytotechnologist: Jeanne Hugh Md, Jeanne Velez, Dermatopathologist, Electronic Signature DOB/Age Jul 09, 1961 (Age: 82) Gender: F Collected Date: 10/18/2023 Received Date: 10/18/2023  FINAL DIAGNOSIS       1. Skin (M), right posterior vertex scalp :       PILAR CYST       DATE SIGNED OUT: 10/23/2023 ELECTRONIC SIGNATURE : Jeanne Velez, Dermatopathologist, Electronic Signature  MICROSCOPIC DESCRIPTION 1. There is a cyst lined by a stratified epithelium with an abrupt  follicular pattern of keratinization without atypia.  This correlates with a pilar cyst.  CASE COMMENTS STAINS USED IN DIAGNOSIS: H&E H&E    CLINICAL HISTORY  SPECIMEN(S) OBTAINED 1. Skin (M), Right  Posterior Vertex Scalp  SPECIMEN COMMENTS: 1. 1.8 cm subcutaneous nodule SPECIMEN CLINICAL INFORMATION: 1. Neoplasm of uncertain behavior, pilar cyst vs EIC    Gross Description 1. Shape:  Includes limited fat; elliptical      Size:  25 x  9 x 9 mm      Lesion:  Clinically mentioned cyst observed      Orientation:  None; all margins inked      Block Summary: Specimen has been submitted in representative sections with 4      cross sections submitted in blocks labeled 1A and 1B.      2 Block(s) submitted. ([place])      A = 2      B = 2        Report signed out from the following location(s) [place] 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 No evidence of rotator cuff damage. Pain triggered by specific movements, particularly rotation. -Rest shoulder from activities that trigger pain, particularly lifting and rotation. Continue other exercises Recommend conservative care with topical muscle rub, heat, ice, stretches. May consider Tylenol AS NEEDED vs NSAID if indicated. Defer other rx management at this time. Caution with sleeping position to avoid provoking symptoms.  Right Great Toe Lesion - Possible Cyst Recent onset, newly identified R Plantar surface Great toe with possible deep cyst or fluid-filled lesion. No evidence of wart, skin growth, rash,  ulcer, or callus. Mildly sore, affecting gait. -Consider use of corn or toe circular pads to offload pressure on lesion. -If continued progression, pain, or limited mobility, consider referral to podiatrist.  Scalp Wound Healing scar tissue from previous cyst removal. No evidence of open wound, bleeding, oozing, or inflammation. -Continue monitoring healing process.         No orders of the defined types were placed in this encounter.   No orders of the defined types were placed in this encounter.   Follow up plan: Return if symptoms worsen or fail to improve.  Saralyn Pilar, DO [facility] 12/11/2023, 9:21 AM

## 2024-01-04 ENCOUNTER — Ambulatory Visit: Payer: PRIVATE HEALTH INSURANCE | Admitting: Dermatology

## 2024-01-23 ENCOUNTER — Ambulatory Visit (INDEPENDENT_AMBULATORY_CARE_PROVIDER_SITE_OTHER): Payer: PRIVATE HEALTH INSURANCE | Admitting: Dermatology

## 2024-01-23 ENCOUNTER — Encounter: Payer: Self-pay | Admitting: Dermatology

## 2024-01-23 DIAGNOSIS — L578 Other skin changes due to chronic exposure to nonionizing radiation: Secondary | ICD-10-CM | POA: Diagnosis not present

## 2024-01-23 DIAGNOSIS — L820 Inflamed seborrheic keratosis: Secondary | ICD-10-CM | POA: Diagnosis not present

## 2024-01-23 DIAGNOSIS — W908XXA Exposure to other nonionizing radiation, initial encounter: Secondary | ICD-10-CM

## 2024-01-23 DIAGNOSIS — L814 Other melanin hyperpigmentation: Secondary | ICD-10-CM

## 2024-01-23 DIAGNOSIS — L700 Acne vulgaris: Secondary | ICD-10-CM

## 2024-01-23 DIAGNOSIS — D1801 Hemangioma of skin and subcutaneous tissue: Secondary | ICD-10-CM

## 2024-01-23 DIAGNOSIS — Z5189 Encounter for other specified aftercare: Secondary | ICD-10-CM

## 2024-01-23 DIAGNOSIS — D239 Other benign neoplasm of skin, unspecified: Secondary | ICD-10-CM

## 2024-01-23 DIAGNOSIS — L821 Other seborrheic keratosis: Secondary | ICD-10-CM

## 2024-01-23 DIAGNOSIS — Z7189 Other specified counseling: Secondary | ICD-10-CM

## 2024-01-23 DIAGNOSIS — Z872 Personal history of diseases of the skin and subcutaneous tissue: Secondary | ICD-10-CM

## 2024-01-23 DIAGNOSIS — D2372 Other benign neoplasm of skin of left lower limb, including hip: Secondary | ICD-10-CM

## 2024-01-23 DIAGNOSIS — Z1283 Encounter for screening for malignant neoplasm of skin: Secondary | ICD-10-CM | POA: Diagnosis not present

## 2024-01-23 DIAGNOSIS — L719 Rosacea, unspecified: Secondary | ICD-10-CM

## 2024-01-23 DIAGNOSIS — Z79899 Other long term (current) drug therapy: Secondary | ICD-10-CM

## 2024-01-23 DIAGNOSIS — D229 Melanocytic nevi, unspecified: Secondary | ICD-10-CM

## 2024-01-23 MED ORDER — WINLEVI 1 % EX CREA: TOPICAL_CREAM | CUTANEOUS | 11 refills | Status: DC

## 2024-01-23 MED ORDER — TRETINOIN 0.05 % EX CREA: TOPICAL_CREAM | CUTANEOUS | 11 refills | Status: DC

## 2024-01-23 NOTE — Progress Notes (Signed)
 Follow-Up Visit   Subjective  Jeanne Velez is a 63 y.o. female who presents for the following: Skin Cancer Screening and Full Body Skin Exam  The patient presents for Total-Body Skin Exam (TBSE) for skin cancer screening and mole check. The patient has spots, moles and lesions to be evaluated, some may be new or changing and the patient may have concern these could be cancer.  Recheck cyst excision site site - pt c/o itching, scabbing, crusting  Lesions on the face and back - itchy, irritated, patient would like removed today  Pt on Winlevi and would like to know - directions state to throw away after one month, is that true? Pt c/o very dry eyes and is concerned it maybe related to [facility]. Can pt get facial while on Winlevi? How soon will she see a difference with Selinda Eon, as she is getting new acne lesions everyday?  The following portions of the chart were reviewed this encounter and updated as appropriate: medications, allergies, medical history  Review of Systems:  No other skin or systemic complaints except as noted in HPI or Assessment and Plan.  Objective  Well appearing patient in no apparent distress; mood and affect are within normal limits.  A full examination was performed including scalp, head, eyes, ears, nose, lips, neck, chest, axillae, abdomen, back, buttocks, bilateral upper extremities, bilateral lower extremities, hands, feet, fingers, toes, fingernails, and toenails. All findings within normal limits unless otherwise noted below.   Relevant physical exam findings are noted in the Assessment and Plan.  abdomen x 2, R cheek x 1, back x 8 (11) Erythematous stuck-on, waxy papule or plaque  Assessment & Plan   SKIN CANCER SCREENING PERFORMED TODAY.  ACTINIC DAMAGE - Chronic condition, secondary to cumulative UV/sun exposure - diffuse scaly erythematous macules with underlying dyspigmentation - Recommend daily broad spectrum sunscreen SPF 30+ to sun-exposed  areas, reapply every 2 hours as needed.  - Staying in the shade or wearing long sleeves, sun glasses (UVA+UVB protection) and wide brim hats (4-inch brim around the entire circumference of the hat) are also recommended for sun protection.  - Call for new or changing lesions.  LENTIGINES, SEBORRHEIC KERATOSES, HEMANGIOMAS - Benign normal skin lesions - Benign-appearing - Call for any changes  MELANOCYTIC NEVI - Tan-brown and/or pink-flesh-colored symmetric macules and papules - Benign appearing on exam today - Observation - Call clinic for new or changing moles - Recommend daily use of broad spectrum spf 30+ sunscreen to sun-exposed areas.   Wound recheck - S/P cyst excision on the scalp    Recommend observation for a few more months as scars can take up to a year to remodel. If scar continues to be itchy and bothersome we can remove the portion of the scar with underlying hairs.  INFLAMED SEBORRHEIC KERATOSIS (11) abdomen x 2, R cheek x 1, back x 8 (11) Symptomatic, irritating, patient would like treated.   Destruction of lesion - abdomen x 2, R cheek x 1, back x 8 (11) Complexity: simple   Destruction method: cryotherapy   Informed consent: discussed and consent obtained   Timeout:  patient name, date of birth, surgical site, and procedure verified Lesion destroyed using liquid nitrogen: Yes   Region frozen until ice ball extended beyond lesion: Yes   Outcome: patient tolerated procedure well with no complications   Post-procedure details: wound care instructions given   MULTIPLE BENIGN NEVI   LENTIGINES   ACTINIC ELASTOSIS   DERMATOFIBROMA   SEBORRHEIC KERATOSES  CHERRY ANGIOMA   ACNE VULGARIS   VISIT FOR WOUND CHECK   COUNSELING AND COORDINATION OF CARE    Acne vulgaris/rosacea - face: scattered inflamed papules  Chronic condition with duration or expected duration over one year. flaring   Rosacea is a chronic progressive skin condition usually  affecting the face of adults, causing redness and/or acne bumps. It is treatable but not curable. It sometimes affects the eyes (ocular rosacea) as well. It may respond to topical and/or systemic medication and can flare with stress, sun exposure, alcohol, exercise, topical steroids (including hydrocortisone/cortisone 10) and some foods.  Daily application of broad spectrum spf 30+ sunscreen to face is recommended to reduce flares.   Continue doxycycline 40 mg daily with food. Doxycycline should be taken with food to prevent nausea. Do not lay down for 30 minutes after taking. Be cautious with sun exposure and use good sun protection while on this medication. Pregnant women should not take this medication.   Continue Winlevi cream BID. Answered patient's questions. Selinda Eon does need to be replaced every month, and unfortunately they do not make smaller sizes. Ok to have facials. Dry eyes not associated with Winlevi.   Continue Tretinoin 0.025% cream QHS. Topical retinoid medications like tretinoin/Retin-A, adapalene/Differin, tazarotene/Fabior, and Epiduo/Epiduo Forte can cause dryness and irritation when first started. Only apply a pea-sized amount to the entire affected area. Avoid applying it around the eyes, edges of mouth and creases at the nose. If you experience irritation, use a good moisturizer first and/or apply the medicine less often. If you are doing well with the medicine, you can increase how often you use it until you are applying every night. Be careful with sun protection while using this medication as it can make you sensitive to the sun. This medicine should not be used by pregnant women.   DERMATOFIBROMA - L thigh and lower leg Exam: Firm pink/brown papulenodule with dimple sign. Treatment Plan: A dermatofibroma is a benign growth possibly related to trauma, such as an insect bite, cut from shaving, or inflamed acne-type bump.  Treatment options to remove include shave or excision  with resulting scar and risk of recurrence.  Since benign-appearing and not bothersome, will observe for now.   Return in about 1 year (around 01/22/2025) for TBSE.  Maylene Roes, CMA, am acting as scribe for Elie Goody, MD .   Documentation: I have reviewed the above documentation for accuracy and completeness, and I agree with the above.  Elie Goody, MD

## 2024-01-23 NOTE — Patient Instructions (Signed)

## 2024-01-25 ENCOUNTER — Encounter: Payer: Self-pay | Admitting: Dermatology

## 2024-01-30 ENCOUNTER — Other Ambulatory Visit: Payer: Self-pay | Admitting: Dermatology

## 2024-01-30 DIAGNOSIS — L700 Acne vulgaris: Secondary | ICD-10-CM

## 2024-01-30 MED ORDER — DOXYCYCLINE HYCLATE 100 MG PO TABS: 100.0000 mg | ORAL_TABLET | Freq: Two times a day (BID) | ORAL | 5 refills | Status: AC

## 2024-02-29 ENCOUNTER — Encounter: Payer: Self-pay | Admitting: Dermatology

## 2024-02-29 ENCOUNTER — Ambulatory Visit: Payer: PRIVATE HEALTH INSURANCE | Admitting: Dermatology

## 2024-02-29 DIAGNOSIS — L700 Acne vulgaris: Secondary | ICD-10-CM | POA: Diagnosis not present

## 2024-02-29 DIAGNOSIS — L820 Inflamed seborrheic keratosis: Secondary | ICD-10-CM

## 2024-02-29 DIAGNOSIS — L719 Rosacea, unspecified: Secondary | ICD-10-CM

## 2024-02-29 MED ORDER — DAPSONE 7.5 % EX GEL: 1.0000 | CUTANEOUS | 11 refills | Status: DC

## 2024-02-29 NOTE — Progress Notes (Signed)
 Follow-Up Visit   Subjective  Jeanne Velez is a 63 y.o. female who presents for the following: Acne/Rosacea face Doxycycline 40mg  1 po qd Winlevi qd, Tretinoin 0.05% cr qhs, acne not improving, did do a recent Doxycycline 100mg  bid x 7 days, pt allergic to Isotretinoin, can't take Spironolactone due to low blood pressure, ISK R cheek did not resolve The patient has spots, moles and lesions to be evaluated, some may be new or changing and the patient may have concern these could be cancer.   The following portions of the chart were reviewed this encounter and updated as appropriate: medications, allergies, medical history  Review of Systems:  No other skin or systemic complaints except as noted in HPI or Assessment and Plan.  Objective  Well appearing patient in no apparent distress; mood and affect are within normal limits.   A focused examination was performed of the following areas: face  Relevant exam findings are noted in the Assessment and Plan.  R cheek x 1 Residual stuck on waxy paps with erythema  Assessment & Plan   ACNE VULGARIS / ROSACEA, inflammatory and cystic with hormonal component, FAILED isotretinoin (headache), spironolactone (low BP), winlevi (greasy and ineffective), clindamycin (stopped working), doxycycline (helps while on it but flares off it), intralesional steroid face Exam: scattered inflammatory papules and one with pustule on chin and perioral skin. Background overlapping central facial telangiectasias but inflamed papules are outside telangiectasias  Chronic and persistent condition with duration or expected duration over one year. Condition is bothersome/symptomatic for patient. Currently flared.   Treatment Plan: Start Dapsone 7.5% gel qd to bid Cont Tretinoin 0.05% cr at bedtime. Discussed that could try 0.1% but unlikely to treat cystic acne which requires systemics Cont Doxycycline 40mg  1 po qd with food and drink and 100 mg BID for bad  flares D/c [name] to be due to diet See Dr Roseanne Reno in case she has other recommendations  Doxycycline should be taken with food to prevent nausea. Do not lay down for 30 minutes after taking. Be cautious with sun exposure and use good sun protection while on this medication. Pregnant women should not take this medication.   Topical retinoid medications like tretinoin/Retin-A, adapalene/Differin, tazarotene/Fabior, and Epiduo/Epiduo Forte can cause dryness and irritation when first started. Only apply a pea-sized amount to the entire affected area. Avoid applying it around the eyes, edges of mouth and creases at the nose. If you experience irritation, use a good moisturizer first and/or apply the medicine less often. If you are doing well with the medicine, you can increase how often you use it until you are applying every night. Be careful with sun protection while using this medication as it can make you sensitive to the sun. This medicine should not be used by pregnant women.   INFLAMED SEBORRHEIC KERATOSIS R cheek x 1 Symptomatic, irritating, patient would like treated. No charge for today's treatment. DO NOT BILL Return for biopsy if not resolved in 1 month Destruction of lesion - R cheek x 1 Complexity: simple   Destruction method: cryotherapy   Informed consent: discussed and consent obtained   Timeout:  patient name, date of birth, surgical site, and procedure verified Lesion destroyed using liquid nitrogen: Yes   Region frozen until ice ball extended beyond lesion: Yes   Cryo cycles: 1 or 2. Outcome: patient tolerated procedure well with no complications   Post-procedure details: wound care instructions given    Return for as scheduled, may schedule with Dr.  Annette Barters for acne 2nd opinion.  I, Rollie Clipper, RMA, am acting as scribe for Harris Liming, MD .   Documentation: I have reviewed the above documentation for accuracy and completeness, and I agree with the  above.  Harris Liming, MD

## 2024-02-29 NOTE — Patient Instructions (Addendum)
 Cryotherapy Aftercare  Wash gently with soap and water everyday.   Apply Vaseline and Band-Aid daily until healed.    Continue Tretinoin 0.05% cream nightly pea size amount to face for acne Continue Doxycycline 40mg  1 pill a day for acne Start Dapsone gel one to two times a day to face for acne Stop Selinda Eon   Due to recent changes in healthcare laws, you may see results of your pathology and/or laboratory studies on MyChart before the doctors have had a chance to review them. We understand that in some cases there may be results that are confusing or concerning to you. Please understand that not all results are received at the same time and often the doctors may need to interpret multiple results in order to provide you with the best plan of care or course of treatment. Therefore, we ask that you please give Jeanne Velez 2 business days to thoroughly review all your results before contacting the office for clarification. Should we see a critical lab result, you will be contacted sooner.   If You Need Anything After Your Visit  If you have any questions or concerns for your doctor, please call our main line at [phone] and press option 4 to reach your doctor's medical assistant. If no one answers, please leave a voicemail as directed and we will return your call as soon as possible. Messages left after 4 pm will be answered the following business day.   You may also send Jeanne Velez a message via MyChart. We typically respond to MyChart messages within 1-2 business days.  For prescription refills, please ask your pharmacy to contact our office. Our fax number is [phone].  If you have an urgent issue when the clinic is closed that cannot wait until the next business day, you can page your doctor at the number below.    Please note that while we do our best to be available for urgent issues outside of office hours, we are not available 24/7.   If you have an urgent issue and are unable to reach Jeanne Velez, you  may choose to seek medical care at your doctor's office, retail clinic, urgent care center, or emergency room.  If you have a medical emergency, please immediately call 911 or go to the emergency department.  Pager Numbers  - Dr. Gwen Pounds: [phone]  - Dr. Roseanne Reno: [phone]  - Dr. Katrinka Blazing: [phone]   In the event of inclement weather, please call our main line at [phone] for an update on the status of any delays or closures.  Dermatology Medication Tips: Please keep the boxes that topical medications come in in order to help keep track of the instructions about where and how to use these. Pharmacies typically print the medication instructions only on the boxes and not directly on the medication tubes.   If your medication is too expensive, please contact our office at [phone] option 4 or send Jeanne Velez a message through MyChart.   We are unable to tell what your co-pay for medications will be in advance as this is different depending on your insurance coverage. However, we may be able to find a substitute medication at lower cost or fill out paperwork to get insurance to cover a needed medication.   If a prior authorization is required to get your medication covered by your insurance company, please allow Jeanne Velez 1-2 business days to complete this process.  Drug prices often vary depending on where the prescription is filled and some pharmacies may offer cheaper prices.  The website www.goodrx.com contains coupons for medications through different pharmacies. The prices here do not account for what the cost may be with help from insurance (it may be cheaper with your insurance), but the website can give you the price if you did not use any insurance.  - You can print the associated coupon and take it with your prescription to the pharmacy.  - You may also stop by our office during regular business hours and pick up a GoodRx coupon card.  - If you need your prescription sent  electronically to a different pharmacy, notify our office through [facility] or by phone at [phone] option 4.     Si Usted Necesita Algo Despus de Su Visita  Tambin puede enviarnos un mensaje a travs de Clinical cytogeneticist. Por lo general respondemos a los mensajes de MyChart en el transcurso de 1 a 2 das hbiles.  Para renovar recetas, por favor pida a su farmacia que se ponga en contacto con nuestra oficina. Annie Sable de fax es [place] [phone].  Si tiene un asunto urgente cuando la clnica est cerrada y que no puede esperar hasta el siguiente da hbil, puede llamar/localizar a su doctor(a) al nmero que aparece a continuacin.   Por favor, tenga en cuenta que aunque hacemos todo lo posible para estar disponibles para asuntos urgentes fuera del horario de [place], no estamos disponibles las 24 horas del da, los 7 [address]  [address] de la [place].   Si tiene un problema urgente y no puede comunicarse con nosotros, puede optar por buscar atencin mdica  en el consultorio de su doctor(a), en una clnica privada, en un centro de atencin urgente o en una sala de emergencias.  Si tiene Engineer, drilling, por favor llame inmediatamente al 911 o vaya a la sala de emergencias.  Nmeros de bper  - Dr. Gwen Pounds: [phone]  - Dra. Roseanne Reno: [phone]  - Dr. Katrinka Blazing: [phone]   En caso de inclemencias del tiempo, por favor llame a Lacy Duverney principal al [phone] para una actualizacin sobre el [place] de cualquier retraso o cierre.  Consejos para la medicacin en dermatologa: Por favor, guarde las cajas en las que vienen los medicamentos de uso tpico para ayudarle a seguir las instrucciones sobre dnde y cmo usarlos. Las farmacias generalmente imprimen las instrucciones del medicamento slo en las cajas y no directamente en los tubos del [place].   Si su medicamento es muy caro, por favor, pngase en contacto con Rolm Gala llamando al [phone] y presione la  opcin 4 o envenos un mensaje a travs de Clinical cytogeneticist.   No podemos decirle cul ser su copago por los medicamentos por adelantado ya que esto es diferente dependiendo de la cobertura de su seguro. Sin embargo, es posible que podamos encontrar un medicamento sustituto a Audiological scientist un formulario para que el seguro cubra el medicamento que se considera necesario.   Si se requiere una autorizacin previa para que su compaa de seguros [place] su medicamento, por favor permtanos de 1 a 2 das hbiles para completar [address]  Los precios de los medicamentos varan con frecuencia dependiendo del Environmental consultant de dnde se surte la receta y alguna farmacias pueden ofrecer precios ms baratos.  El sitio web www.goodrx.com tiene cupones para medicamentos de Health and safety inspector. Los precios aqu no tienen en cuenta lo que podra costar con la ayuda del seguro (puede ser ms barato con su seguro), pero el sitio web puede darle el precio si no utiliz Tourist information centre manager.  -  Puede imprimir el cupn correspondiente y llevarlo con su receta a la farmacia.  - Tambin puede pasar por nuestra oficina durante el horario de atencin regular y Education officer, museum una tarjeta de cupones de GoodRx.  - Si necesita que su receta se enve electrnicamente a una farmacia diferente, informe a nuestra oficina a travs de MyChart de [place] o por telfono llamando al [phone] y presione la opcin 4.

## 2024-03-27 ENCOUNTER — Ambulatory Visit: Payer: PRIVATE HEALTH INSURANCE | Admitting: Dermatology

## 2024-03-27 ENCOUNTER — Other Ambulatory Visit: Payer: Self-pay

## 2024-03-27 DIAGNOSIS — Z7189 Other specified counseling: Secondary | ICD-10-CM

## 2024-03-27 DIAGNOSIS — Z79899 Other long term (current) drug therapy: Secondary | ICD-10-CM

## 2024-03-27 DIAGNOSIS — L700 Acne vulgaris: Secondary | ICD-10-CM | POA: Diagnosis not present

## 2024-03-27 MED ORDER — SPIRONOLACTONE 25 MG PO TABS: 25.0000 mg | ORAL_TABLET | Freq: Every day | ORAL | 1 refills | Status: DC

## 2024-03-27 MED ORDER — WINLEVI 1 % EX CREA: TOPICAL_CREAM | CUTANEOUS | 2 refills | Status: DC

## 2024-03-27 MED ORDER — AMZEEQ 4 % EX FOAM: CUTANEOUS | 2 refills | Status: AC

## 2024-03-27 MED ORDER — ADAPALENE-BENZOYL PEROXIDE 0.3-2.5 % EX GEL: CUTANEOUS | 2 refills | Status: AC

## 2024-03-27 NOTE — Patient Instructions (Addendum)
 For acne: Spironolactone 25mg , can even go up to 50mg  if having no side effects like hypotension. Discontinue if experiencing dizziness. Recommend taking in the evenings when going to bed.    Spironolactone can cause increased urination and cause blood pressure to decrease. Please watch for signs of lightheadedness and be cautious when changing position. It can sometimes cause breast tenderness or an irregular period in premenopausal women. It can also increase potassium. The increase in potassium usually is not a concern unless you are taking other medicines that also increase potassium, so please be sure your doctor knows all of the other medications you are taking. This medication should not be taken by pregnant women.  This medicine should also not be taken together with sulfa  drugs like Bactrim  (trimethoprim /sulfamethexazole).   2. Use Winlevi  twice a day, more effective if used twice daily, to reduce oil glands on face. Moisturizing, use small amount to affected areas, cheeks. Thin layer in the mornings, then nightly apply first then epi duo forte on top of Winlevi .   3. Epi duo forte with benzoyl peroxide, but can bleach clothes. Recommend using nightly, use white pillow cases to avoid bleaching. Wash off in the mornings. Use pea-sized amount, can be drying.   4. Amzeeq foam topical, use once a day daily, use small amount to aa in the mornings. Recommend applying foam first for better absorption.  5. May continue Doxycyline 40mg  PO daily, take with food. When taking 100mg  do not take 40mg .   Doxycycline  should be taken with food to prevent nausea. Do not lay down for 30 minutes after taking. Be cautious with sun exposure and use good sun protection while on this medication. Pregnant women should not take this medication.   Can put tretinoin  aside, do not use with epi duo forte    Due to recent changes in healthcare laws, you may see results of your pathology and/or laboratory studies on  MyChart before the doctors have had a chance to review them. We understand that in some cases there may be results that are confusing or concerning to you. Please understand that not all results are received at the same time and often the doctors may need to interpret multiple results in order to provide you with the best plan of care or course of treatment. Therefore, we ask that you please give us  2 business days to thoroughly review all your results before contacting the office for clarification. Should we see a critical lab result, you will be contacted sooner.   If You Need Anything After Your Visit  If you have any questions or concerns for your doctor, please call our main line at [phone] and press option 4 to reach your doctor's medical assistant. If no one answers, please leave a voicemail as directed and we will return your call as soon as possible. Messages left after 4 pm will be answered the following business day.   You may also send us  a message via MyChart. We typically respond to MyChart messages within 1-2 business days.  For prescription refills, please ask your pharmacy to contact our office. Our fax number is [phone].  If you have an urgent issue when the clinic is closed that cannot wait until the next business day, you can page your doctor at the number below.    Please note that while we do our best to be available for urgent issues outside of office hours, we are not available 24/7.   If you have an urgent  issue and are unable to reach us , you may choose to seek medical care at your doctor's office, retail clinic, urgent care center, or emergency room.  If you have a medical emergency, please immediately call 911 or go to the emergency department.  Pager Numbers  - Dr. Bary Likes: [phone]  - Dr. Annette Barters: [phone]  - Dr. Felipe Horton: [phone]   In the event of inclement weather, please call our main line at [phone] for an update on the status of  any delays or closures.  Dermatology Medication Tips: Please keep the boxes that topical medications come in in order to help keep track of the instructions about where and how to use these. Pharmacies typically print the medication instructions only on the boxes and not directly on the medication tubes.   If your medication is too expensive, please contact our office at [phone] option 4 or send us  a message through MyChart.   We are unable to tell what your co-pay for medications will be in advance as this is different depending on your insurance coverage. However, we may be able to find a substitute medication at lower cost or fill out paperwork to get insurance to cover a needed medication.   If a prior authorization is required to get your medication covered by your insurance company, please allow us  1-2 business days to complete this process.  Drug prices often vary depending on where the prescription is filled and some pharmacies may offer cheaper prices.  The website www.goodrx.com contains coupons for medications through different pharmacies. The prices here do not account for what the cost may be with help from insurance (it may be cheaper with your insurance), but the website can give you the price if you did not use any insurance.  - You can print the associated coupon and take it with your prescription to the pharmacy.  - You may also stop by our office during regular business hours and pick up a GoodRx coupon card.  - If you need your prescription sent electronically to a different pharmacy, notify our office through [facility] or by phone at [phone] option 4.     Si Usted Necesita Algo Despus de Su Visita  Tambin puede enviarnos un mensaje a travs de Clinical cytogeneticist. Por lo general respondemos a los mensajes de MyChart en el transcurso de 1 a 2 das hbiles.  Para renovar recetas, por favor pida a su farmacia que se ponga en contacto con nuestra oficina. Franz Jacks de fax es [place] [phone].  Si tiene un asunto urgente cuando la clnica est cerrada y que no puede esperar hasta el siguiente da hbil, puede llamar/localizar a su doctor(a) al nmero que aparece a continuacin.   Por favor, tenga en cuenta que aunque hacemos todo lo posible para estar disponibles para asuntos urgentes fuera del horario de [place], no estamos disponibles las 24 horas del da, los 7 [address]  [address] de la [place].   Si tiene un problema urgente y no puede comunicarse con nosotros, puede optar por buscar atencin mdica  en el consultorio de su doctor(a), en una clnica privada, en un centro de atencin urgente o en una sala de emergencias.  Si tiene Engineer, drilling, por favor llame inmediatamente al 911 o vaya a la sala de emergencias.  Nmeros de bper  - Dr. Bary Likes: [phone]  - Dra. Annette Barters: [phone]  - Dr. Felipe Horton: [phone]   En caso de inclemencias del tiempo, por favor llame a nuestra lnea principal al [phone] para  una actualizacin [employer] de cualquier retraso o cierre.  Consejos para la medicacin en dermatologa: Por favor, guarde las cajas en las que vienen los medicamentos de uso tpico para ayudarle a seguir las instrucciones sobre dnde y cmo usarlos. Las farmacias generalmente imprimen las instrucciones del medicamento slo en las cajas y no directamente en los tubos del [place].   Si su medicamento es muy caro, por favor, pngase en contacto con [name] Brunet llamando al [phone] y presione la opcin 4 o envenos un mensaje a travs de Clinical cytogeneticist.   No podemos decirle cul ser su copago por los medicamentos por adelantado ya que esto es diferente dependiendo de la cobertura de su seguro. Sin embargo, es posible que podamos encontrar un medicamento sustituto a Audiological scientist un formulario para que el seguro cubra el medicamento que se considera necesario.   Si se requiere una autorizacin previa para que su compaa  de seguros [place] su medicamento, por favor permtanos de 1 a 2 das hbiles para completar este proceso.  Los precios de los medicamentos varan con frecuencia dependiendo del Environmental consultant de dnde se surte la receta y alguna farmacias pueden ofrecer precios ms baratos.  El sitio web www.goodrx.com tiene cupones para medicamentos de Health and safety inspector. Los precios aqu no tienen en cuenta lo que podra costar con la ayuda del seguro (puede ser ms barato con su seguro), pero el sitio web puede darle el precio si no utiliz Tourist information centre manager.  - Puede imprimir el cupn correspondiente y llevarlo con su receta a la farmacia.  - Tambin puede pasar por nuestra oficina durante el horario de atencin regular y Education officer, museum una tarjeta de cupones de GoodRx.  - Si necesita que su receta se enve electrnicamente a una farmacia diferente, informe a nuestra oficina a travs de MyChart de [place] o por telfono llamando al [phone] y presione la opcin 4.

## 2024-03-27 NOTE — Progress Notes (Signed)
 Follow-Up Visit   Subjective  Jeanne Velez is a 63 y.o. female who presents for the following: second opinion for acne, patient saw Dr. Felipe Horton and uses Dapsone  gel in the mornings, tretinoin  nightly, doxycycline  40mg  PO daily, when flaring takes 100mg  BID. Patient has failed isotretinoin  (severe headache with first dose 20 mg), spironolactone (never took due to low BP), winlevi  (greasy and ineffective x4 months- used once daily), clindamycin  (stopped working), doxycycline  (helps while on it but flares off it), intralesional steroid injections. Double cleanses with dermaologica products. Patient uses benzoyl peroxide by [employer] to spot treat, but does not like uses because it bleaches clothes/linens. Patient was on course of isotretinoin  in her 30s-40s, patient states she almost reached end of course but her lipid panels were increasing. Back in January 2025 started isotretinoin  20mg  and within 24 hours developed severe headache. Pt has had total hysterectomy and take estrogen supplements for years.  Pt states her face is very oily.  The following portions of the chart were reviewed this encounter and updated as appropriate: medications, allergies, medical history  Review of Systems:  No other skin or systemic complaints except as noted in HPI or Assessment and Plan.  Objective  Well appearing patient in no apparent distress; mood and affect are within normal limits.  A focused examination was performed of the following areas: Face  Relevant exam findings are noted in the Assessment and Plan.    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 FAILED isotretinoin  (headache), spironolactone (low BP), winlevi  (greasy and ineffective), clindamycin  (stopped working), doxycycline  (helps while on it but flares off it), intralesional steroid   Exam: Multiple inflammatory papules on chin, lower cheeks, some resolving.   Chronic and persistent condition with duration or  expected duration over one year. Condition is bothersome/symptomatic for patient. Currently flared.   Treatment Plan: Will start Spironolactone 25mg , may even go up to 50mg  if having no side effects due to hypotension. Discontinue if experiencing dizziness/light-headedness. Recommend taking in the evenings when going to bed.    Spironolactone can cause increased urination and cause blood pressure to decrease. Please watch for signs of lightheadedness and be cautious when changing position. It can sometimes cause breast tenderness or an irregular period in premenopausal women. It can also increase potassium. The increase in potassium usually is not a concern unless you are taking other medicines that also increase potassium, so please be sure your doctor knows all of the other medications you are taking. This medication should not be taken by pregnant women.  This medicine should also not be taken together with sulfa  drugs like Bactrim  (trimethoprim /sulfamethexazole).   Start Epiduo forte with benzoyl peroxide, but can bleach. Recommend using nightly, use white pillow cases to avoid bleaching. Wash off in the mornings. Use pea-sized amount, can be drying.   restart Winlevi  BID, more effective if used twice daily, to reduce oil production on face. use small amount to aa, cheeks. Thin layer in the mornings apply second after Amzeeq, nightly apply first then epi duo forte. Will send to [facility]  Amzeeq foam topical, use once a day daily, use small amount to aa face in the mornings. Recommend applying foam first for better absorption, followed by Winlevi . Will send to [facility]  Will d/c topical tretinoin  and topical dapsone  for now  May continue Doxycyline 40mg  PO daily, take with food. Can increase to 100 mg PO bid with flares for up to 2 weeks.  When taking 100mg  tab  do not take 40mg  tab.   Doxycycline  should be taken with food to prevent nausea. Do not lay down for 30 minutes after taking. Be cautious  with sun exposure and use good sun protection while on this medication. Pregnant women should not take this medication.    Discussed TheraClear procedure for acne which combines broad band light and suction and can add in addition to everything else. Will run benefits with insurance for approval for [employer].    ACNE VULGARIS   Related Procedures TheraClear Treatment Related Medications doxycycline  (VIBRA -TABS) 100 MG tablet Take 1 tablet (100 mg total) by mouth 2 (two) times daily. Take as needed for acne flares for up to 14 days  Return in about 10 weeks (around 06/05/2024) for w/ Dr. Annette Barters, Acne.  I, Jacquelynn Vera, CMA, am acting as scribe for Artemio Larry, MD .   Documentation: I have reviewed the above documentation for accuracy and completeness, and I agree with the above.  Artemio Larry, MD

## 2024-03-27 NOTE — Progress Notes (Signed)
 Correction in RX. aw

## 2024-03-28 ENCOUNTER — Telehealth: Payer: Self-pay

## 2024-03-28 NOTE — Telephone Encounter (Signed)
 Patient's TheraClear benefits came back approved.  One tx every 10 days with a $35.00 copay per treatment. TheraClear and office visit same day are NOT covered.   Patient's insurance does term at the end of the month. aw

## 2024-04-01 ENCOUNTER — Telehealth: Payer: Self-pay

## 2024-04-01 NOTE — Telephone Encounter (Signed)
 Called pt discussed  no make up and no topicals morning of and if she is on Doxycycline , she needs to hold it the day of treatment-for her Theraclear

## 2024-04-02 ENCOUNTER — Ambulatory Visit (INDEPENDENT_AMBULATORY_CARE_PROVIDER_SITE_OTHER): Payer: PRIVATE HEALTH INSURANCE

## 2024-04-02 DIAGNOSIS — L700 Acne vulgaris: Secondary | ICD-10-CM

## 2024-04-02 NOTE — Progress Notes (Signed)
 Patient here today for a TheraClear session for acne vulgaris.    Treatment # 1 Treatment Area: Face and Neck Energy: 4 Vacuum: 2 Pulse: 750 Pulse Delay: double   Photos taken and placed in media.  Patient understands and agrees she has not taken any forms of Isotretinoin  within the last 4-6 months, she has not taken any Doxycycline  or used topical Retin-A .      Lisbeth Rides, RMA

## 2024-04-16 ENCOUNTER — Ambulatory Visit (INDEPENDENT_AMBULATORY_CARE_PROVIDER_SITE_OTHER): Payer: PRIVATE HEALTH INSURANCE

## 2024-04-16 ENCOUNTER — Telehealth: Payer: Self-pay

## 2024-04-16 DIAGNOSIS — L700 Acne vulgaris: Secondary | ICD-10-CM

## 2024-04-16 NOTE — Telephone Encounter (Signed)
 Patient is traveling next week to [place] for work and unable to take her Amzeeq   with her due to aerosol. She wanted to know can she just take her high dose of Doxycycline  for the week since she wont be using topical?

## 2024-04-16 NOTE — Progress Notes (Signed)
 Patient here today for a TheraClear session for acne vulgaris.    Treatment # 2 Treatment Area: Face and Neck Energy: 4 Vacuum: 2 Pulse: 750 Pulse Delay: double   Photos taken and placed in media.  Patient understands and agrees she has not taken any forms of Isotretinoin  within the last 4-6 months, she has not taken any Doxycycline  or used topical Retin-A .      Lisbeth Rides, RMA

## 2024-04-24 ENCOUNTER — Encounter: Payer: Self-pay | Admitting: Family Medicine

## 2024-04-29 ENCOUNTER — Encounter: Payer: PRIVATE HEALTH INSURANCE | Admitting: Family Medicine

## 2024-04-30 ENCOUNTER — Ambulatory Visit: Payer: PRIVATE HEALTH INSURANCE

## 2024-04-30 DIAGNOSIS — L700 Acne vulgaris: Secondary | ICD-10-CM | POA: Diagnosis not present

## 2024-04-30 NOTE — Progress Notes (Signed)
 Patient here today for a TheraClear session for acne vulgaris.    Treatment # 3 Treatment Area: Face and Neck Energy: 4 Vacuum: 2 Pulse: 750 Pulse Delay: double   Photos taken and placed in media.  Patient understands and agrees she has not taken any forms of Isotretinoin  within the last 4-6 months, she has not taken any Doxycycline  or used topical Retin-A .      Lisbeth Rides, RMA

## 2024-05-01 ENCOUNTER — Encounter: Payer: Self-pay | Admitting: Family Medicine

## 2024-05-01 ENCOUNTER — Ambulatory Visit (INDEPENDENT_AMBULATORY_CARE_PROVIDER_SITE_OTHER): Payer: PRIVATE HEALTH INSURANCE | Admitting: Family Medicine

## 2024-05-01 VITALS — BP 80/58 | HR 91 | Ht 68.0 in | Wt 173.2 lb

## 2024-05-01 DIAGNOSIS — E890 Postprocedural hypothyroidism: Secondary | ICD-10-CM

## 2024-05-01 DIAGNOSIS — R7303 Prediabetes: Secondary | ICD-10-CM

## 2024-05-01 DIAGNOSIS — Z0000 Encounter for general adult medical examination without abnormal findings: Secondary | ICD-10-CM

## 2024-05-01 DIAGNOSIS — E782 Mixed hyperlipidemia: Secondary | ICD-10-CM | POA: Diagnosis not present

## 2024-05-01 DIAGNOSIS — M542 Cervicalgia: Secondary | ICD-10-CM

## 2024-05-01 DIAGNOSIS — M150 Primary generalized (osteo)arthritis: Secondary | ICD-10-CM

## 2024-05-01 NOTE — Patient Instructions (Addendum)
 Thank you for coming to the office today.  Reduce Spironolactone  from 25mg  tab to half tab 12.5mg  daily for a few days.  May pause off Spirnolactone in future if still too low and symptomatic. Remain off med if needed.  Labs ordered.   Please schedule a Follow-up Appointment to: Return for 1 year fasting lab > 1 week later Annual Physical.  If you have any other questions or concerns, please feel free to call the office or send a message through MyChart. You may also schedule an earlier appointment if necessary.  Additionally, you may be receiving a survey about your experience at our office within a few days to 1 week by e-mail or mail. We value your feedback.  Domingo Friend, DO [facility] [place], [place]

## 2024-05-01 NOTE — Progress Notes (Addendum)
 Subjective:    Patient ID: Jeanne Velez, female    DOB: Mar 19, 1961, 63 y.o.   MRN: [id number]  Jeanne Velez is a 63 y.o. female presenting on 05/01/2024 for Annual Exam   HPI  Discussed the use of AI scribe software for clinical note transcription with the patient, who gave verbal consent to proceed.  History of Present Illness   Jeanne Velez is a 63 year old female who presents for an annual physical exam.  Acne She is managing ongoing acne with treatments including spironolactone , adapalene  and other. Her dermatologist suggested a hormonal component, but she is unsure about which hormones to test. She has been on estradiol  since a C-section 18 years ago and is concerned about hormone imbalances.  Low BP She is currently on spironolactone , which has led to significant drops in blood pressure, causing symptoms of tiredness, lightheadedness, and dizziness, especially upon standing. Her blood pressure has been as low as 80/58. No recent falls, but she felt lightheaded while gardening a few months ago.  She has a history of cancer and remains vigilant about any new symptoms or changes in her health.      BMI >26 Hypothyroidism Followed by [facility] Continues on Zepbound 5mg  weekly She has lost over 50 pounds and currently weighs 173 pounds. She is considering a target weight in the 160s but is unsure of the ideal weight for her age and health. On Levothyroxine 125mcg daily   HYPERLIPIDEMIA: - Reports no concerns. Last lipid panel 10/2022, elevated LDL 145 Not on statin therapy   Chronic Neck Pain Osteoarthritis multiple joints Muscle spasms She continues with Massage therapy that has proven to be medically necessary given significant benefits in pain relief and functional improvement and mobility of her neck and back. She was originally advised to pursue this therapy in 04/2022 and has continued therapy since that time. She continues to benefit from massage therapy and has  experienced relapse in symptoms pain spasm if >4-5 weeks between treatments.   Health Maintenance: Shingrix completed 2024  Colonoscopy next 2029  Mammogram 09/2023 next in 09/2024      05/01/2024    8:50 AM 12/11/2023    9:40 AM 04/25/2023    8:49 AM  Depression screen PHQ 2/9  Decreased Interest 0 0 0  Down, Depressed, Hopeless 0 0 0  PHQ - 2 Score 0 0 0  Altered sleeping 0  0  Tired, decreased energy 1  0  Change in appetite 0  0  Feeling bad or failure about yourself  0  0  Trouble concentrating 0  0  Moving slowly or fidgety/restless 0  0  Suicidal thoughts 0  0  PHQ-9 Score 1  0  Difficult doing work/chores Not difficult at all  Not difficult at all       05/01/2024    8:50 AM 12/11/2023    9:40 AM 04/25/2023    8:49 AM 06/20/2022    3:36 PM  GAD 7 : Generalized Anxiety Score  Nervous, Anxious, on Edge 2 1 0 1  Control/stop worrying 0 0 0 0  Worry too much - different things 0 0 0 0  Trouble relaxing 1 1 0 2  Restless 0 0 0 0  Easily annoyed or irritable 1 1 0 2  Afraid - awful might happen 0 0 0 0  Total GAD 7 Score 4 3 0 5  Anxiety Difficulty Not difficult at all Not difficult at all Not difficult at all Not difficult  at all     Past Medical History:  Diagnosis Date   Acne    Allergy    Anemia    Back pain    BRCA negative 07/2020   MyRisk neg   Cataracts, bilateral    Colon polyp    Constipation    Family history of breast cancer 07/2020   IBIS=15.1%/riskscore=6.7%   Family history of skin cancer 02/24/2022   Folliculitis    GERD (gastroesophageal reflux disease)    Hyperlipidemia    Hypothyroidism    Joint pain    Macular degeneration    Osteoarthritis    Retinal vasculitis    Stress    Thyroid cancer (HCC)    Past Surgical History:  Procedure Laterality Date   ABDOMINAL HYSTERECTOMY     BREAST BIOPSY Right    yrs ago benign, no marker   COLONOSCOPY WITH PROPOFOL  N/A 12/14/2022   Procedure: COLONOSCOPY WITH PROPOFOL ;  Surgeon: Unk Corinn Skiff, MD;  Location: [facility] ENDOSCOPY;  Service: Gastroenterology;  Laterality: N/A;   FLEXIBLE SIGMOIDOSCOPY N/A 04/28/2021   Procedure: FLEXIBLE SIGMOIDOSCOPY;  Surgeon: Unk Corinn Skiff, MD;  Location: [facility] ENDOSCOPY;  Service: Gastroenterology;  Laterality: N/A;   ovarial cystectomy     THYROIDECTOMY     TOTAL ABDOMINAL HYSTERECTOMY W/ BILATERAL SALPINGOOPHORECTOMY     dermoids, ovar cysts   TUBAL LIGATION     Social History   Socioeconomic History   Marital status: Married    Spouse name: Not on file   Number of children: Not on file   Years of education: Not on file   Highest education level: Master's degree (e.g., MA, MS, MEng, MEd, MSW, MBA)  Occupational History   Occupation: Recreational therapist  Tobacco Use   Smoking status: Never   Smokeless tobacco: Never  Vaping Use   Vaping status: Never Used  Substance and Sexual Activity   Alcohol use: Yes    Comment: rarely   Drug use: Never   Sexual activity: Yes    Birth control/protection: Surgical    Comment: Hysterectomy  Other Topics Concern   Not on file  Social History Narrative   Not on file   Social Drivers of Health   Financial Resource Strain: Low Risk  (04/30/2024)   Overall Financial Resource Strain (CARDIA)    Difficulty of Paying Living Expenses: Not hard at all  Food Insecurity: No Food Insecurity (04/30/2024)   Hunger Vital Sign    Worried About Running Out of Food in the Last Year: Never true    Ran Out of Food in the Last Year: Never true  Transportation Needs: No Transportation Needs (04/30/2024)   PRAPARE - Administrator, Civil Service (Medical): No    Lack of Transportation (Non-Medical): No  Physical Activity: Insufficiently Active (04/30/2024)   Exercise Vital Sign    Days of Exercise per Week: 4 days    Minutes of Exercise per Session: 30 min  Stress: Stress Concern Present (04/30/2024)   [employer] of Occupational Health - Occupational Stress Questionnaire     Feeling of Stress: To some extent  Social Connections: Moderately Integrated (04/30/2024)   Social Connection and Isolation Panel    Frequency of Communication with Friends and Family: More than three times a week    Frequency of Social Gatherings with Friends and Family: Once a week    Attends Religious Services: Never    Database administrator or Organizations: Yes    Attends Engineer, structural: More  than 4 times per year    Marital Status: Married  Catering manager Violence: Not on file   Family History  Problem Relation Age of Onset   Myasthenia gravis Mother    Heart disease Father    Depression Father    Skin cancer Father        dx 32s; face/scalp   Anxiety disorder Father    Obesity Father    Bone cancer Maternal Aunt 60   Melanoma Maternal Aunt        dx 27s; bottom of foot   Lung cancer Maternal Aunt    Breast cancer Maternal Grandmother        dx unknown age   Skin cancer Maternal Grandmother        dx unknown age   Breast cancer Cousin        dx 75s; paternal female cousin   Melanoma Cousin        dx 81s; paternal female cousin   Current Outpatient Medications on File Prior to Visit  Medication Sig   acetaminophen (TYLENOL) 325 MG tablet Take 650 mg by mouth every 6 (six) hours as needed.   Adapalene -Benzoyl Peroxide  (EPIDUO  FORTE) 0.3-2.5 % GEL Use pea-sized amount nightly   Clascoterone  (WINLEVI ) 1 % CREA Apply to face once daily for acne   doxycycline  (ORACEA ) 40 MG capsule Take 40 mg by mouth daily.   doxycycline  (VIBRA -TABS) 100 MG tablet Take 1 tablet (100 mg total) by mouth 2 (two) times daily. Take as needed for acne flares for up to 14 days   estradiol  (ESTRACE ) 0.5 MG tablet Take 1 tablet (0.5 mg total) by mouth daily.   fluticasone  (FLONASE ) 50 MCG/ACT nasal spray Place 2 sprays into both nostrils daily. Use for 4-6 weeks then stop and use seasonally or as needed.   levothyroxine (SYNTHROID) 125 MCG tablet Take 125 mcg by mouth daily before  breakfast.   loratadine (CLARITIN) 10 MG tablet Take 10 mg by mouth daily.   Minocycline  HCl Micronized (AMZEEQ ) 4 % FOAM Use small amount to aa in the mornings   Multiple Vitamins-Minerals (PRESERVISION AREDS 2 PO) Take 1 capsule by mouth 2 (two) times daily.    Omega-3 Fatty Acids (OMEGA 3 PO) Take by mouth.   Probiotic Product (PROBIOTIC-10 PO) Take by mouth.   spironolactone  (ALDACTONE ) 25 MG tablet Take 1 tablet (25 mg total) by mouth daily.   ZEPBOUND 5 MG/0.5ML Pen INJECT THE CONTENTS OF ONE SYRINGE UNDER THE SKIN ONCE WEEKLY   Dapsone  7.5 % GEL Apply 1 Application topically as directed. qd to bid to face for acne (Patient not taking: Reported on 05/01/2024)   NON FORMULARY Hypochlorous spray on face and back 2x daily (Patient not taking: Reported on 05/01/2024)   tretinoin  (RETIN-A ) 0.05 % cream Apply a pea sized amount to the entire face. (Patient not taking: Reported on 05/01/2024)   No current facility-administered medications on file prior to visit.    Review of Systems  Constitutional:  Negative for activity change, appetite change, chills, diaphoresis, fatigue and fever.  HENT:  Negative for congestion and hearing loss.   Eyes:  Negative for visual disturbance.  Respiratory:  Negative for cough, chest tightness, shortness of breath and wheezing.   Cardiovascular:  Negative for chest pain, palpitations and leg swelling.  Gastrointestinal:  Negative for abdominal pain, constipation, diarrhea, nausea and vomiting.  Genitourinary:  Negative for dysuria, frequency and hematuria.  Musculoskeletal:  Negative for arthralgias and neck pain.  Skin:  Negative  for rash.  Neurological:  Negative for dizziness, weakness, light-headedness, numbness and headaches.  Hematological:  Negative for adenopathy.  Psychiatric/Behavioral:  Negative for behavioral problems, dysphoric mood and sleep disturbance.    Per HPI unless specifically indicated above     Objective:    BP (!) 80/58 (BP  Location: Left Arm, Patient Position: Sitting, Cuff Size: Normal)   Pulse 91   Ht 5' 8 (1.727 m)   Wt 173 lb 3.2 oz (78.6 kg)   SpO2 100%   BMI 26.33 kg/m   Wt Readings from Last 3 Encounters:  05/01/24 173 lb 3.2 oz (78.6 kg)  12/11/23 185 lb (83.9 kg)  11/23/23 186 lb (84.4 kg)    Physical Exam Vitals and nursing note reviewed.  Constitutional:      General: She is not in acute distress.    Appearance: She is well-developed. She is not diaphoretic.     Comments: Well-appearing, comfortable, cooperative  HENT:     Head: Normocephalic and atraumatic.  Eyes:     General:        Right eye: No discharge.        Left eye: No discharge.     Conjunctiva/sclera: Conjunctivae normal.     Pupils: Pupils are equal, round, and reactive to light.  Neck:     Thyroid: No thyromegaly.     Vascular: No carotid bruit.  Cardiovascular:     Rate and Rhythm: Normal rate and regular rhythm.     Pulses: Normal pulses.     Heart sounds: Normal heart sounds. No murmur heard. Pulmonary:     Effort: Pulmonary effort is normal. No respiratory distress.     Breath sounds: Normal breath sounds. No wheezing or rales.  Abdominal:     General: Bowel sounds are normal. There is no distension.     Palpations: Abdomen is soft. There is no mass.     Tenderness: There is no abdominal tenderness.  Musculoskeletal:        General: No tenderness. Normal range of motion.     Cervical back: Normal range of motion and neck supple.     Right lower leg: No edema.     Left lower leg: No edema.     Comments: Upper / Lower Extremities: - Normal muscle tone, strength bilateral upper extremities 5/5, lower extremities 5/5  Lymphadenopathy:     Cervical: No cervical adenopathy.  Skin:    General: Skin is warm and dry.     Findings: No erythema or rash.  Neurological:     Mental Status: She is alert and oriented to person, place, and time.     Comments: Distal sensation intact to light touch all extremities   Psychiatric:        Mood and Affect: Mood normal.        Behavior: Behavior normal.        Thought Content: Thought content normal.     Comments: Well groomed, good eye contact, normal speech and thoughts     Results for orders placed or performed in visit on 05/01/24  TSH   Collection Time: 05/01/24  9:34 AM  Result Value Ref Range   TSH 0.40 0.40 - 4.50 mIU/L  Lipid panel   Collection Time: 05/01/24  9:34 AM  Result Value Ref Range   Cholesterol 159 <200 mg/dL   HDL 51 > OR = 50 mg/dL   Triglycerides 885 <849 mg/dL   LDL Cholesterol (Calc) 87 mg/dL (calc)   Total CHOL/HDL  Ratio 3.1 <5.0 (calc)   Non-HDL Cholesterol (Calc) 108 <130 mg/dL (calc)  Hemoglobin J8r   Collection Time: 05/01/24  9:34 AM  Result Value Ref Range   Hgb A1c MFr Bld 5.2 <5.7 %   Mean Plasma Glucose 103 mg/dL   eAG (mmol/L) 5.7 mmol/L  CBC with Differential/Platelet   Collection Time: 05/01/24  9:34 AM  Result Value Ref Range   WBC 6.9 3.8 - 10.8 Thousand/uL   RBC 4.46 3.80 - 5.10 Million/uL   Hemoglobin 13.6 11.7 - 15.5 g/dL   HCT 58.2 64.9 - 54.9 %   MCV 93.5 80.0 - 100.0 fL   MCH 30.5 27.0 - 33.0 pg   MCHC 32.6 32.0 - 36.0 g/dL   RDW 88.1 88.9 - 84.9 %   Platelets 276 140 - 400 Thousand/uL   MPV 10.1 7.5 - 12.5 fL   Neutro Abs 4,354 1,500 - 7,800 cells/uL   Absolute Lymphocytes 2,029 850 - 3,900 cells/uL   Absolute Monocytes 359 200 - 950 cells/uL   Eosinophils Absolute 117 15 - 500 cells/uL   Basophils Absolute 41 0 - 200 cells/uL   Neutrophils Relative % 63.1 %   Total Lymphocyte 29.4 %   Monocytes Relative 5.2 %   Eosinophils Relative 1.7 %   Basophils Relative 0.6 %  Comprehensive metabolic panel with GFR   Collection Time: 05/01/24  9:34 AM  Result Value Ref Range   Glucose, Bld 84 65 - 99 mg/dL   BUN 16 7 - 25 mg/dL   Creat 9.10 9.49 - 8.94 mg/dL   eGFR 74 > OR = 60 fO/fpw/8.26f7   BUN/Creatinine Ratio SEE NOTE: 6 - 22 (calc)   Sodium 138 135 - 146 mmol/L   Potassium 4.9 3.5  - 5.3 mmol/L   Chloride 103 98 - 110 mmol/L   CO2 29 20 - 32 mmol/L   Calcium 9.8 8.6 - 10.4 mg/dL   Total Protein 7.4 6.1 - 8.1 g/dL   Albumin 4.4 3.6 - 5.1 g/dL   Globulin 3.0 1.9 - 3.7 g/dL (calc)   AG Ratio 1.5 1.0 - 2.5 (calc)   Total Bilirubin 1.2 0.2 - 1.2 mg/dL   Alkaline phosphatase (APISO) 70 37 - 153 U/L   AST 13 10 - 35 U/L   ALT 11 6 - 29 U/L  T4, free   Collection Time: 05/01/24  9:34 AM  Result Value Ref Range   Free T4 2.0 (H) 0.8 - 1.8 ng/dL      Assessment & Plan:   Problem List Items Addressed This Visit     Hypothyroidism   Relevant Orders   TSH (Completed)   CBC with Differential/Platelet (Completed)   T4, free (Completed)   Mixed hyperlipidemia   Relevant Orders   Lipid panel (Completed)   CBC with Differential/Platelet (Completed)   Comprehensive metabolic panel with GFR (Completed)   CT CARDIAC SCORING (SELF PAY ONLY) (Completed)   Pre-diabetes   Relevant Orders   Hemoglobin A1c (Completed)   Other Visit Diagnoses       Annual physical exam    -  Primary   Relevant Orders   TSH (Completed)   Lipid panel (Completed)   Hemoglobin A1c (Completed)   CBC with Differential/Platelet (Completed)   Comprehensive metabolic panel with GFR (Completed)   T4, free (Completed)     Primary osteoarthritis involving multiple joints       Relevant Orders   Comprehensive metabolic panel with GFR (Completed)     Neck pain  Updated Health Maintenance information Reviewed recent lab results with patient Encouraged improvement to lifestyle with diet and exercise Goal of weight loss   Annual Physical Examination Routine examination with low blood pressure, likely due to spironolactone . - Perform fasting lab work: chemistry, kidney, liver, cholesterol, glucose, CBC, thyroid function tests. - Schedule next year's physical examination with fasting lab work after the visit.  Hypotension Blood pressure as low as 70/50 mmHg, likely due to  spironolactone . Symptoms include lightheadedness and fatigue. Weight loss may contribute. - Reduce spironolactone  25 to 12.5 dose to half for a few days, consider pausing if no improvement. This rx is from Dermatology - Monitor blood pressure and symptoms, adjust spironolactone  as needed.  Acne - improved. Persistent acne possibly hormone-related, managed with spironolactone , adapalene , and other therapy. Dermatologist suggested hormone imbalance but no specific testing recommended. - Reduce spironolactone  25 to 12.5mg  dose to half for a few days, consider pausing if no improvement. - Monitor blood pressure and symptoms, adjust spironolactone  as needed. - Consult dermatologist if further guidance needed.  Weight Management Significant weight loss over 50 pounds. Current weight 173 pounds, BMI 26. Discussed target weight range, aiming for 160s if desired. - Continue current weight management plan. - Discuss target weight range, aiming for 160s if desired.  General Health Maintenance Routine health maintenance up to date. Mammogram due in November. Coronary artery calcium score CT scan discussed for coronary artery disease screening. - Order coronary artery calcium score CT scan. - Schedule mammogram for November.  Neck Pain / Muscle Spasm / Osteoarthritis Continue massage therapy as advised given medical necessity benefit from this therapy  Follow-up Plans for ongoing management and monitoring. Coordination with specialists as needed. - Schedule follow-up appointment in one year for annual physical examination. - Coordinate with dermatologist and endocrinologist as needed.       Orders Placed This Encounter  Procedures   CT CARDIAC SCORING (SELF PAY ONLY)    Standing Status:   Future    Number of Occurrences:   1    Expiration Date:   05/01/2025    Is patient pregnant?:   No    Preferred imaging location?:   [facility]   TSH   Lipid panel    Has the patient fasted?:   Yes    Hemoglobin A1c   CBC with Differential/Platelet   Comprehensive metabolic panel with GFR    Has the patient fasted?:   Yes   T4, free    No orders of the defined types were placed in this encounter.    Follow up plan: Return for 1 year fasting lab > 1 week later Annual Physical.  Marsa Officer, DO [facility] 05/01/2024, 9:11 AM

## 2024-05-02 ENCOUNTER — Ambulatory Visit: Payer: Self-pay | Admitting: Family Medicine

## 2024-05-02 LAB — CBC WITH DIFFERENTIAL/PLATELET
Absolute Lymphocytes: 2029 {cells}/uL (ref 850–3900)
Absolute Monocytes: 359 {cells}/uL (ref 200–950)
Basophils Absolute: 41 {cells}/uL (ref 0–200)
Basophils Relative: 0.6 %
Eosinophils Absolute: 117 {cells}/uL (ref 15–500)
Eosinophils Relative: 1.7 %
HCT: 41.7 % (ref 35.0–45.0)
Hemoglobin: 13.6 g/dL (ref 11.7–15.5)
MCH: 30.5 pg (ref 27.0–33.0)
MCHC: 32.6 g/dL (ref 32.0–36.0)
MCV: 93.5 fL (ref 80.0–100.0)
MPV: 10.1 fL (ref 7.5–12.5)
Monocytes Relative: 5.2 %
Neutro Abs: 4354 {cells}/uL (ref 1500–7800)
Neutrophils Relative %: 63.1 %
Platelets: 276 10*3/uL (ref 140–400)
RBC: 4.46 10*6/uL (ref 3.80–5.10)
RDW: 11.8 % (ref 11.0–15.0)
Total Lymphocyte: 29.4 %
WBC: 6.9 10*3/uL (ref 3.8–10.8)

## 2024-05-02 LAB — HEMOGLOBIN A1C
Hgb A1c MFr Bld: 5.2 % (ref <5.7)
Mean Plasma Glucose: 103 mg/dL
eAG (mmol/L): 5.7 mmol/L

## 2024-05-02 LAB — COMPREHENSIVE METABOLIC PANEL WITH GFR
AG Ratio: 1.5 (calc) (ref 1.0–2.5)
ALT: 11 U/L (ref 6–29)
AST: 13 U/L (ref 10–35)
Albumin: 4.4 g/dL (ref 3.6–5.1)
Alkaline phosphatase (APISO): 70 U/L (ref 37–153)
BUN: 16 mg/dL (ref 7–25)
CO2: 29 mmol/L (ref 20–32)
Calcium: 9.8 mg/dL (ref 8.6–10.4)
Chloride: 103 mmol/L (ref 98–110)
Creat: 0.89 mg/dL (ref 0.50–1.05)
Globulin: 3 g/dL (ref 1.9–3.7)
Glucose, Bld: 84 mg/dL (ref 65–99)
Potassium: 4.9 mmol/L (ref 3.5–5.3)
Sodium: 138 mmol/L (ref 135–146)
Total Bilirubin: 1.2 mg/dL (ref 0.2–1.2)
Total Protein: 7.4 g/dL (ref 6.1–8.1)
eGFR: 74 mL/min/{1.73_m2} (ref >=60)

## 2024-05-02 LAB — LIPID PANEL
Cholesterol: 159 mg/dL (ref <200)
HDL: 51 mg/dL (ref >=50)
LDL Cholesterol (Calc): 87 mg/dL
Non-HDL Cholesterol (Calc): 108 mg/dL (ref <130)
Total CHOL/HDL Ratio: 3.1 (calc) (ref <5.0)
Triglycerides: 114 mg/dL (ref <150)

## 2024-05-02 LAB — TSH: TSH: 0.4 m[IU]/L (ref 0.40–4.50)

## 2024-05-02 LAB — T4, FREE: Free T4: 2 ng/dL — ABNORMAL HIGH (ref 0.8–1.8)

## 2024-05-03 ENCOUNTER — Encounter: Payer: Self-pay | Admitting: Dermatology

## 2024-05-14 ENCOUNTER — Ambulatory Visit
Admission: RE | Admit: 2024-05-14 | Discharge: 2024-05-14 | Disposition: A | Discharge status: Home / Self Care | Payer: PRIVATE HEALTH INSURANCE | Source: Ambulatory Visit | Attending: Family Medicine | Admitting: Family Medicine

## 2024-05-14 DIAGNOSIS — E782 Mixed hyperlipidemia: Secondary | ICD-10-CM | POA: Insufficient documentation

## 2024-05-16 ENCOUNTER — Ambulatory Visit: Payer: PRIVATE HEALTH INSURANCE

## 2024-05-16 ENCOUNTER — Other Ambulatory Visit (HOSPITAL_COMMUNITY): Payer: Self-pay

## 2024-05-16 DIAGNOSIS — L700 Acne vulgaris: Secondary | ICD-10-CM

## 2024-05-20 NOTE — Progress Notes (Signed)
 Patient here today for a TheraClear session for acne vulgaris.    Treatment # 4 Treatment Area: Face and Neck Energy: 4 Vacuum: 2 Pulse: 750 Pulse Delay: double   Photos taken and placed in media.  Patient understands and agrees she has not taken any forms of Isotretinoin  within the last 4-6 months, she has not taken any Doxycycline  or used topical Retin-A .      Alan Pizza, RMA

## 2024-05-29 ENCOUNTER — Ambulatory Visit (INDEPENDENT_AMBULATORY_CARE_PROVIDER_SITE_OTHER): Payer: PRIVATE HEALTH INSURANCE | Admitting: Dermatology

## 2024-05-29 DIAGNOSIS — Z792 Long term (current) use of antibiotics: Secondary | ICD-10-CM | POA: Diagnosis not present

## 2024-05-29 DIAGNOSIS — L700 Acne vulgaris: Secondary | ICD-10-CM | POA: Diagnosis not present

## 2024-05-29 DIAGNOSIS — Z79899 Other long term (current) drug therapy: Secondary | ICD-10-CM | POA: Diagnosis not present

## 2024-05-29 DIAGNOSIS — Z7189 Other specified counseling: Secondary | ICD-10-CM | POA: Diagnosis not present

## 2024-05-29 MED ORDER — WINLEVI 1 % EX CREA: TOPICAL_CREAM | CUTANEOUS | 2 refills | Status: DC

## 2024-05-29 NOTE — Progress Notes (Signed)
 Follow-Up Visit   Subjective  Jeanne Velez is a 63 y.o. female who presents for the following: 10 week acne f/u. Currently doing TheraClear procedures, Winlevi  BID, using epiduo  nightly, using Amzeeq  foam, and taking Doxycyline 40mg  daily. Patient states skin doing a lot better and lots of improvement within 2 weeks. No longer breaking out around chin/jaw.  Not able to tolerate spironolactone  due to side effects.  Also has had side effects with isotretinoin  and can't take it either.   The following portions of the chart were reviewed this encounter and updated as appropriate: medications, allergies, medical history  Review of Systems:  No other skin or systemic complaints except as noted in HPI or Assessment and Plan.  Objective  Well appearing patient in no apparent distress; mood and affect are within normal limits.  A full examination was performed including scalp, head, eyes, ears, nose, lips, neck, chest, axillae, abdomen, back, buttocks, bilateral upper extremities, bilateral lower extremities, hands, feet, fingers, toes, fingernails, and toenails. All findings within normal limits unless otherwise noted below.   A focused examination was performed of the following areas: Face  Relevant exam findings are noted in the Assessment and Plan.    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 FAILED isotretinoin  (headache), spironolactone  (low BP), clindamycin  (stopped working), doxycycline  (helps while on it but flares off it), intralesional steroid  Exam: Inflammatory papule at L chin x2, few closed comedones chin/cheeks.    Chronic condition with duration or expected duration over one year. Currently well-controlled.   Treatment Plan: Continue Amzeeq  foam topical, use once a day daily, use small amount to aa face in the mornings. Recommend applying foam first for better absorption, followed by Winlevi . Patient getting rx from [facility]. Rfs sent  Continue  adapalene -benzoyl peroxide  (epiduo  forte) can bleach. Recommend using nightly, use white pillow cases to avoid bleaching. Wash off in the mornings. Use pea-sized amount, can be drying.    Continue Winlevi  BID, more effective if used twice daily, to reduce oil production on face. use small amount to aa, cheeks. Thin layer in the mornings apply second after Amzeeq , nightly apply first then epi duo forte. More refills sent today to [facility].  Continue Doxycyline 40mg  PO daily, take with food. Can increase to 100 mg PO bid with flares for up to 2 weeks.  When taking 100mg  tab do not take 40mg  tab.   Doxycycline  should be taken with food to prevent nausea. Do not lay down for 30 minutes after taking. Be cautious with sun exposure and use good sun protection while on this medication. Pregnant women should not take this medication.   Continue TheraClear every 2 weeks.  Pt c/o sunscreen not sticking to skin after applying Amzeeq  and Winlevi . Recommend light chemical based sunscreen on first in AM, allow it to dry, then may apply prescriptions over top. Samples given to patient.    ACNE VULGARIS   Related Procedures TheraClear Treatment Related Medications doxycycline  (VIBRA -TABS) 100 MG tablet Take 1 tablet (100 mg total) by mouth 2 (two) times daily. Take as needed for acne flares for up to 14 days COUNSELING AND COORDINATION OF CARE   Related Procedures TheraClear Treatment  Return for 3-4 months w/ Dr. Jackquline for acne.  I, Jacquelynn V. Wilfred, CMA, am acting as scribe for Rexene Jackquline, MD .   Documentation: I have reviewed the above documentation for accuracy and completeness, and I agree with the above.  Rexene Jackquline, MD

## 2024-05-29 NOTE — Patient Instructions (Addendum)
 [facility]     Due to recent changes in healthcare laws, you may see results of your pathology and/or laboratory studies on MyChart before the doctors have had a chance to review them. We understand that in some cases there may be results that are confusing or concerning to you. Please understand that not all results are received at the same time and often the doctors may need to interpret multiple results in order to provide you with the best plan of care or course of treatment. Therefore, we ask that you please give us  2 business days to thoroughly review all your results before contacting the office for clarification. Should we see a critical lab result, you will be contacted sooner.   If You Need Anything After Your Visit  If you have any questions or concerns for your doctor, please call our main line at [phone] and press option 4 to reach your doctor's medical assistant. If no one answers, please leave a voicemail as directed and we will return your call as soon as possible. Messages left after 4 pm will be answered the following business day.   You may also send us  a message via MyChart. We typically respond to MyChart messages within 1-2 business days.  For prescription refills, please ask your pharmacy to contact our office. Our fax number is [phone].  If you have an urgent issue when the clinic is closed that cannot wait until the next business day, you can page your doctor at the number below.    Please note that while we do our best to be available for urgent issues outside of office hours, we are not available 24/7.   If you have an urgent issue and are unable to reach us , you may choose to seek medical care at your doctor's office, retail clinic, urgent care center, or emergency room.  If you have a medical emergency, please immediately call 911 or go to the emergency department.  Pager Numbers  - Dr. Hester: [phone]  - Dr. Jackquline: [phone]  - Dr.  Claudene: [phone]   In the event of inclement weather, please call our main line at [phone] for an update on the status of any delays or closures.  Dermatology Medication Tips: Please keep the boxes that topical medications come in in order to help keep track of the instructions about where and how to use these. Pharmacies typically print the medication instructions only on the boxes and not directly on the medication tubes.   If your medication is too expensive, please contact our office at [phone] option 4 or send us  a message through MyChart.   We are unable to tell what your co-pay for medications will be in advance as this is different depending on your insurance coverage. However, we may be able to find a substitute medication at lower cost or fill out paperwork to get insurance to cover a needed medication.   If a prior authorization is required to get your medication covered by your insurance company, please allow us  1-2 business days to complete this process.  Drug prices often vary depending on where the prescription is filled and some pharmacies may offer cheaper prices.  The website www.goodrx.com contains coupons for medications through different pharmacies. The prices here do not account for what the cost may be with help from insurance (it may be cheaper with your insurance), but the website can give you the price if you did not use any insurance.  - You can print the  associated coupon and take it with your prescription to the pharmacy.  - You may also stop by our office during regular business hours and pick up a GoodRx coupon card.  - If you need your prescription sent electronically to a different pharmacy, notify our office through [facility] or by phone at [phone] option 4.     Si Usted Necesita Algo Despus de Su Visita  Tambin puede enviarnos un mensaje a travs de Clinical cytogeneticist. Por lo general respondemos a los mensajes de MyChart en el transcurso  de 1 a 2 das hbiles.  Para renovar recetas, por favor pida a su farmacia que se ponga en contacto con nuestra oficina. Randi lakes de fax es [place] [phone].  Si tiene un asunto urgente cuando la clnica est cerrada y que no puede esperar hasta el siguiente da hbil, puede llamar/localizar a su doctor(a) al nmero que aparece a continuacin.   Por favor, tenga en cuenta que aunque hacemos todo lo posible para estar disponibles para asuntos urgentes fuera del horario de [place], no estamos disponibles las 24 horas del da, los 7 [address]  [address] de la [place].   Si tiene un problema urgente y no puede comunicarse con nosotros, puede optar por buscar atencin mdica  en el consultorio de su doctor(a), en una clnica privada, en un centro de atencin urgente o en una sala de emergencias.  Si tiene Engineer, drilling, por favor llame inmediatamente al 911 o vaya a la sala de emergencias.  Nmeros de bper  - Dr. Hester: [phone]  - Dra. Jackquline: [phone]  - Dr. Claudene: [phone]   En caso de inclemencias del tiempo, por favor llame a landry capes principal al [phone] para una actualizacin sobre el [place] de cualquier retraso o cierre.  Consejos para la medicacin en dermatologa: Por favor, guarde las cajas en las que vienen los medicamentos de uso tpico para ayudarle a seguir las instrucciones sobre dnde y cmo usarlos. Las farmacias generalmente imprimen las instrucciones del medicamento slo en las cajas y no directamente en los tubos del [place].   Si su medicamento es muy caro, por favor, pngase en contacto con landry rieger llamando al [phone] y presione la opcin 4 o envenos un mensaje a travs de Clinical cytogeneticist.   No podemos decirle cul ser su copago por los medicamentos por adelantado ya que esto es diferente dependiendo de la cobertura de su seguro. Sin embargo, es posible que podamos encontrar un medicamento sustituto a Audiological scientist un formulario  para que el seguro cubra el medicamento que se considera necesario.   Si se requiere una autorizacin previa para que su compaa de seguros malta su medicamento, por favor permtanos de 1 a 2 das hbiles para completar este proceso.  Los precios de los medicamentos varan con frecuencia dependiendo del Environmental consultant de dnde se surte la receta y alguna farmacias pueden ofrecer precios ms baratos.  El sitio web www.goodrx.com tiene cupones para medicamentos de Health and safety inspector. Los precios aqu no tienen en cuenta lo que podra costar con la ayuda del seguro (puede ser ms barato con su seguro), pero el sitio web puede darle el precio si no utiliz Tourist information centre manager.  - Puede imprimir el cupn correspondiente y llevarlo con su receta a la farmacia.  - Tambin puede pasar por nuestra oficina durante el horario de atencin regular y Education officer, museum una tarjeta de cupones de GoodRx.  - Si necesita que su receta se enve electrnicamente a Psychiatrist, informe a nuestra oficina  a travs de MyChart de [place] o por telfono llamando al [phone] y presione la opcin 4.

## 2024-05-30 ENCOUNTER — Ambulatory Visit: Payer: PRIVATE HEALTH INSURANCE

## 2024-05-30 DIAGNOSIS — Z7189 Other specified counseling: Secondary | ICD-10-CM

## 2024-05-30 DIAGNOSIS — L700 Acne vulgaris: Secondary | ICD-10-CM | POA: Diagnosis not present

## 2024-05-30 NOTE — Progress Notes (Signed)
 Patient here today for a TheraClear session for acne vulgaris.    Treatment # 5 Treatment Area: Face and Neck Energy: 4 Vacuum: 2 Pulse: 750 Pulse Delay: double   Photos taken and placed in media.  Patient understands and agrees she has not taken any forms of Isotretinoin  within the last 4-6 months, she has not taken any Doxycycline  or used topical Retin-A .      Alan Pizza, RMA

## 2024-06-06 ENCOUNTER — Other Ambulatory Visit: Payer: Self-pay

## 2024-06-06 MED ORDER — WINLEVI 1 % EX CREA: TOPICAL_CREAM | CUTANEOUS | 2 refills | Status: DC

## 2024-06-06 NOTE — Progress Notes (Signed)
 Received fax refill request from [facility] [place] for winlevi  1% cream. Spoke with patient and confirmed. Escripted refills to [facility], [place].

## 2024-06-17 ENCOUNTER — Ambulatory Visit: Payer: PRIVATE HEALTH INSURANCE | Admitting: Dermatology

## 2024-06-20 ENCOUNTER — Ambulatory Visit: Payer: PRIVATE HEALTH INSURANCE

## 2024-06-20 DIAGNOSIS — Z7189 Other specified counseling: Secondary | ICD-10-CM

## 2024-06-20 DIAGNOSIS — L700 Acne vulgaris: Secondary | ICD-10-CM | POA: Diagnosis not present

## 2024-06-20 NOTE — Progress Notes (Signed)
 Patient here today for a TheraClear session for acne vulgaris.    Treatment # 6 Treatment Area: Face and Neck Energy: 4 Vacuum: 2 Pulse: 750 Pulse Delay: double   Photos taken and placed in media.  Patient understands and agrees she has not taken any forms of Isotretinoin  within the last 4-6 months, she has not taken any Doxycycline  or used topical Retin-A .      Alan Pizza, RMA

## 2024-06-27 ENCOUNTER — Encounter: Payer: Self-pay | Admitting: Family Medicine

## 2024-07-04 ENCOUNTER — Ambulatory Visit (INDEPENDENT_AMBULATORY_CARE_PROVIDER_SITE_OTHER): Payer: PRIVATE HEALTH INSURANCE

## 2024-07-04 DIAGNOSIS — L700 Acne vulgaris: Secondary | ICD-10-CM

## 2024-07-04 DIAGNOSIS — Z7189 Other specified counseling: Secondary | ICD-10-CM

## 2024-07-04 NOTE — Progress Notes (Signed)
 Patient here today for a TheraClear session for acne vulgaris.    Treatment # 7 Treatment Area: Face and Neck Energy: 4 Vacuum: 2 Pulse: 750 Pulse Delay: double   Photos taken and placed in media.  Patient understands and agrees she has not taken any forms of Isotretinoin  within the last 4-6 months, she has not taken any Doxycycline  or used topical Retin-A .      Alan Pizza, RMA

## 2024-07-23 ENCOUNTER — Encounter: Payer: Self-pay | Admitting: Dermatology

## 2024-07-24 ENCOUNTER — Ambulatory Visit: Payer: PRIVATE HEALTH INSURANCE | Admitting: Dermatology

## 2024-07-24 ENCOUNTER — Encounter: Payer: Self-pay | Admitting: Dermatology

## 2024-07-24 DIAGNOSIS — L729 Follicular cyst of the skin and subcutaneous tissue, unspecified: Secondary | ICD-10-CM

## 2024-07-24 DIAGNOSIS — D1801 Hemangioma of skin and subcutaneous tissue: Secondary | ICD-10-CM

## 2024-07-24 DIAGNOSIS — L821 Other seborrheic keratosis: Secondary | ICD-10-CM | POA: Diagnosis not present

## 2024-07-24 DIAGNOSIS — L814 Other melanin hyperpigmentation: Secondary | ICD-10-CM

## 2024-07-24 DIAGNOSIS — L820 Inflamed seborrheic keratosis: Secondary | ICD-10-CM | POA: Diagnosis not present

## 2024-07-24 DIAGNOSIS — L720 Epidermal cyst: Secondary | ICD-10-CM

## 2024-07-24 NOTE — Progress Notes (Signed)
   Follow-Up Visit   Subjective  Jeanne Velez is a 63 y.o. female who presents for the following: itchy spots back, shoulders Pt also is being treated for acne and gets Theraclear treatments, but not being seen for acne today.  The following portions of the chart were reviewed this encounter and updated as appropriate: medications, allergies, medical history  Review of Systems:  No other skin or systemic complaints except as noted in HPI or Assessment and Plan.  Objective  Well appearing patient in no apparent distress; mood and affect are within normal limits.   A focused examination was performed of the following areas: Back, shoulders  Relevant exam findings are noted in the Assessment and Plan.  R lower back x 1, L post shoulder x 8, R post shoulder x 8, R spinal lower back x 1, L spinal mid upper back x 5 (23) Stuck on waxy paps with erythema  Assessment & Plan   EPIDERMAL INCLUSION CYST L upper back Exam: firm white papule at L upper back  Benign-appearing. Exam most consistent with an epidermal inclusion cyst. Discussed that a cyst is a benign growth that can grow over time and sometimes get irritated or inflamed. Recommend observation if it is not bothersome. Discussed option of surgical excision to remove it if it is growing, symptomatic, or other changes noted. Please call for new or changing lesions so they can be evaluated.  SEBORRHEIC KERATOSIS back - Stuck-on, waxy, tan-brown papules - Benign-appearing - Discussed benign etiology and prognosis. - Observe - Call for any changes  LENTIGINES back Exam: scattered tan macules Due to sun exposure Treatment Plan: Benign-appearing, observe. Recommend daily broad spectrum sunscreen SPF 30+ to sun-exposed areas, reapply every 2 hours as needed.  Call for any changes  HEMANGIOMA back Exam: red papule(s) Discussed benign nature. Recommend observation. Call for changes.   INFLAMED SEBORRHEIC KERATOSIS (23) R lower  back x 1, L post shoulder x 8, R post shoulder x 8, R spinal lower back x 1, L spinal mid upper back x 5 (23) Symptomatic, irritating, patient would like treated. Destruction of lesion - R lower back x 1, L post shoulder x 8, R post shoulder x 8, R spinal lower back x 1, L spinal mid upper back x 5 (23)  Destruction method: cryotherapy   Informed consent: discussed and consent obtained   Lesion destroyed using liquid nitrogen: Yes   Region frozen until ice ball extended beyond lesion: Yes   Outcome: patient tolerated procedure well with no complications   Post-procedure details: wound care instructions given   Additional details:  Prior to procedure, discussed risks of blister formation, small wound, skin dyspigmentation, or rare scar following cryotherapy. Recommend Vaseline ointment to treated areas while healing.   SEBORRHEIC KERATOSIS   LENTIGO   HEMANGIOMA OF SKIN   CYST OF SKIN    Return for as scheduled.  I, Grayce Saunas, RMA, am acting as scribe for Rexene Rattler, MD .   Documentation: I have reviewed the above documentation for accuracy and completeness, and I agree with the above.  Rexene Rattler, MD

## 2024-07-24 NOTE — Patient Instructions (Addendum)

## 2024-07-25 ENCOUNTER — Ambulatory Visit: Payer: PRIVATE HEALTH INSURANCE

## 2024-07-29 ENCOUNTER — Other Ambulatory Visit: Payer: Self-pay | Admitting: Obstetrics and Gynecology

## 2024-07-29 ENCOUNTER — Encounter: Payer: Self-pay | Admitting: Obstetrics and Gynecology

## 2024-07-29 MED ORDER — FLUCONAZOLE 150 MG PO TABS: 150.0000 mg | ORAL_TABLET | Freq: Once | ORAL | 0 refills | Status: AC

## 2024-07-29 NOTE — Progress Notes (Signed)
Rx diflucan for yeast vag sx.  

## 2024-07-30 ENCOUNTER — Ambulatory Visit: Payer: PRIVATE HEALTH INSURANCE

## 2024-07-30 DIAGNOSIS — Z7189 Other specified counseling: Secondary | ICD-10-CM

## 2024-07-30 DIAGNOSIS — L700 Acne vulgaris: Secondary | ICD-10-CM

## 2024-07-30 NOTE — Progress Notes (Signed)
 Patient here today for a TheraClear session for acne vulgaris.    Treatment # 8 Treatment Area: Face and Neck Energy: 4 Vacuum: 2 Pulse: 750 Pulse Delay: double   Photos taken and placed in media.  Patient understands and agrees she has not taken any forms of Isotretinoin  within the last 4-6 months, she has not taken any Doxycycline  or used topical Retin-A .      Alan Pizza, RMA

## 2024-08-09 ENCOUNTER — Encounter: Payer: Self-pay | Admitting: Family Medicine

## 2024-08-09 ENCOUNTER — Ambulatory Visit: Payer: PRIVATE HEALTH INSURANCE | Admitting: Family Medicine

## 2024-08-09 VITALS — BP 110/76 | HR 98 | Ht 68.0 in | Wt 174.5 lb

## 2024-08-09 DIAGNOSIS — Z23 Encounter for immunization: Secondary | ICD-10-CM

## 2024-08-09 DIAGNOSIS — M25552 Pain in left hip: Secondary | ICD-10-CM

## 2024-08-09 DIAGNOSIS — G8929 Other chronic pain: Secondary | ICD-10-CM

## 2024-08-09 DIAGNOSIS — J3089 Other allergic rhinitis: Secondary | ICD-10-CM

## 2024-08-09 DIAGNOSIS — M25562 Pain in left knee: Secondary | ICD-10-CM | POA: Diagnosis not present

## 2024-08-09 MED ORDER — BACLOFEN 5 MG PO TABS: 5.0000 mg | ORAL_TABLET | Freq: Three times a day (TID) | ORAL | 0 refills | Status: DC | PRN

## 2024-08-09 MED ORDER — COVID-19 MRNA VAC-TRIS(PFIZER) 30 MCG/0.3ML IM SUSY: 0.3000 mL | PREFILLED_SYRINGE | Freq: Once | INTRAMUSCULAR | 0 refills | Status: AC

## 2024-08-09 MED ORDER — AZELASTINE HCL 0.1 % NA SOLN: 2.0000 | Freq: Two times a day (BID) | NASAL | 12 refills | Status: DC

## 2024-08-09 NOTE — Progress Notes (Signed)
 Subjective:    Patient ID: Jeanne Velez, female    DOB: 01-21-1961, 63 y.o.   MRN: [id number]  Jeanne Velez is a 63 y.o. female presenting on 08/09/2024 for Hip Pain (Left )  Patient presents for a same day appointment.  HPI  Discussed the use of AI scribe software for clinical note transcription with the patient, who gave verbal consent to proceed.  History of Present Illness   Jeanne Velez is a 63 year old female with arthritis who presents with left hip pain.  Left Hip Pain - Catching and popping sensation in the left hip for 2-3 weeks - Symptoms primarily occur during longer strides or increased exertion while walking - No consistent symptoms during normal household activities - Occasional popping at night, resulting in aching pain that interferes with sleep - No history of traumatic injury to the hip - History of significant left hip pain 6-8 years ago attributed to tightness, previously managed with x-rays and physical therapy focused on stretching and strength training - Current symptom management includes ibuprofen and self-directed stretching exercises based on prior physical therapy  Left knee pain and swelling - Persistent left knee pain for at least 1.5 years - Knee pain present even with minimal activity - Pain exacerbated by descending stairs - Intermittent swelling of the left knee, managed with periodic icing - Previous left knee x-ray approximately 1.5 years ago showed minimal arthritis  Medication sensitivities and use - History of sensitivity to medications, prefers lower doses - Previous use of muscle relaxants for neck injuries, but found them too sedating for regular use         08/09/2024   10:40 AM 05/01/2024    8:50 AM 12/11/2023    9:40 AM  Depression screen PHQ 2/9  Decreased Interest 0 0 0  Down, Depressed, Hopeless 0 0 0  PHQ - 2 Score 0 0 0  Altered sleeping 1 0   Tired, decreased energy 1 1   Change in appetite 0 0   Feeling bad or failure  about yourself  0 0   Trouble concentrating 0 0   Moving slowly or fidgety/restless 0 0   Suicidal thoughts 0 0   PHQ-9 Score 2 1   Difficult doing work/chores Not difficult at all Not difficult at all        08/09/2024   10:40 AM 05/01/2024    8:50 AM 12/11/2023    9:40 AM 04/25/2023    8:49 AM  GAD 7 : Generalized Anxiety Score  Nervous, Anxious, on Edge 0 2 1 0  Control/stop worrying 0 0 0 0  Worry too much - different things 0 0 0 0  Trouble relaxing 0 1 1 0  Restless 0 0 0 0  Easily annoyed or irritable 1 1 1  0  Afraid - awful might happen 0 0 0 0  Total GAD 7 Score 1 4 3  0  Anxiety Difficulty Not difficult at all Not difficult at all Not difficult at all Not difficult at all    Social History   Tobacco Use   Smoking status: Never   Smokeless tobacco: Never  Vaping Use   Vaping status: Never Used  Substance Use Topics   Alcohol use: Yes    Comment: rarely   Drug use: Never    Review of Systems Per HPI unless specifically indicated above     Objective:    BP 110/76 (BP Location: Left Arm, Patient Position: Sitting, Cuff Size: Normal)  Pulse 98   Ht 5' 8 (1.727 m)   Wt 174 lb 8 oz (79.2 kg)   SpO2 99%   BMI 26.53 kg/m   Wt Readings from Last 3 Encounters:  08/09/24 174 lb 8 oz (79.2 kg)  05/01/24 173 lb 3.2 oz (78.6 kg)  12/11/23 185 lb (83.9 kg)    Physical Exam Vitals and nursing note reviewed.  Constitutional:      General: She is not in acute distress.    Appearance: Normal appearance. She is well-developed. She is not diaphoretic.     Comments: Well-appearing, comfortable, cooperative  HENT:     Head: Normocephalic and atraumatic.  Eyes:     General:        Right eye: No discharge.        Left eye: No discharge.     Conjunctiva/sclera: Conjunctivae normal.  Cardiovascular:     Rate and Rhythm: Normal rate.  Pulmonary:     Effort: Pulmonary effort is normal.  Musculoskeletal:     Comments: Left hip and Greater  Trochanter Inspection: BACK - Normal appearance, no spinal deformity, symmetrical. HIP - Normal appearance, symmetrical, no obvious leg length or pelvis deformity  Palpation: HIP - Mild tender to palpation deeper Left greater trochanter region of lateral upper thigh. Lower extremity thigh calf soft non tender no spasm.  ROM: BACK - Full active ROM forward flex / back extension, rotation L/R without discomfort HIP - Bilateral hip flex/ext supine normal, internal and external rotation normal without problem or limitation.  Special Testing: BACK - Seated SLR negative for radicular pain bilaterally HIP - FABER FADIR normal and non tender no limited movement. Acetabular compression of hip left hip is negative  Strength: Bilateral hip flex/ext 5/5, knee flex/ext 5/5, ankle dorsiflex/plantarflex 5/5 Neurovascular: intact distal sensation to light touch   Left Knee intact range of motion. No obvious effusion today.  Lachman / Valgus/Varus tests negative with intact ligaments (ACL, MCL, LCL)  Skin:    General: Skin is warm and dry.     Findings: No erythema or rash.  Neurological:     Mental Status: She is alert and oriented to person, place, and time.  Psychiatric:        Mood and Affect: Mood normal.        Behavior: Behavior normal.        Thought Content: Thought content normal.     Comments: Well groomed, good eye contact, normal speech and thoughts     I have personally reviewed the radiology report from 12/2022 on Left Knee X-ray.   CLINICAL DATA:  Acute on chronic left knee pain   EXAM: LEFT KNEE - COMPLETE 4+ VIEW   COMPARISON:  None Available.   FINDINGS: Normal alignment. No acute fracture or dislocation. Minimal medial and lateral patellofemoral compartment degenerative arthritis with tiny osteophyte formation. Joint spaces are preserved. No effusion. Soft tissues are unremarkable.   IMPRESSION: 1. Minimal degenerative arthritis.     Electronically Signed    By: Dorethia Molt M.D.   On: 12/27/2022 23:24  Results for orders placed or performed in visit on 05/01/24  TSH   Collection Time: 05/01/24  9:34 AM  Result Value Ref Range   TSH 0.40 0.40 - 4.50 mIU/L  Lipid panel   Collection Time: 05/01/24  9:34 AM  Result Value Ref Range   Cholesterol 159 <200 mg/dL   HDL 51 > OR = 50 mg/dL   Triglycerides 885 <849 mg/dL   LDL Cholesterol (  Calc) 87 mg/dL (calc)   Total CHOL/HDL Ratio 3.1 <5.0 (calc)   Non-HDL Cholesterol (Calc) 108 <130 mg/dL (calc)  Hemoglobin J8r   Collection Time: 05/01/24  9:34 AM  Result Value Ref Range   Hgb A1c MFr Bld 5.2 <5.7 %   Mean Plasma Glucose 103 mg/dL   eAG (mmol/L) 5.7 mmol/L  CBC with Differential/Platelet   Collection Time: 05/01/24  9:34 AM  Result Value Ref Range   WBC 6.9 3.8 - 10.8 Thousand/uL   RBC 4.46 3.80 - 5.10 Million/uL   Hemoglobin 13.6 11.7 - 15.5 g/dL   HCT 58.2 64.9 - 54.9 %   MCV 93.5 80.0 - 100.0 fL   MCH 30.5 27.0 - 33.0 pg   MCHC 32.6 32.0 - 36.0 g/dL   RDW 88.1 88.9 - 84.9 %   Platelets 276 140 - 400 Thousand/uL   MPV 10.1 7.5 - 12.5 fL   Neutro Abs 4,354 1,500 - 7,800 cells/uL   Absolute Lymphocytes 2,029 850 - 3,900 cells/uL   Absolute Monocytes 359 200 - 950 cells/uL   Eosinophils Absolute 117 15 - 500 cells/uL   Basophils Absolute 41 0 - 200 cells/uL   Neutrophils Relative % 63.1 %   Total Lymphocyte 29.4 %   Monocytes Relative 5.2 %   Eosinophils Relative 1.7 %   Basophils Relative 0.6 %  Comprehensive metabolic panel with GFR   Collection Time: 05/01/24  9:34 AM  Result Value Ref Range   Glucose, Bld 84 65 - 99 mg/dL   BUN 16 7 - 25 mg/dL   Creat 9.10 9.49 - 8.94 mg/dL   eGFR 74 > OR = 60 fO/fpw/8.26f7   BUN/Creatinine Ratio SEE NOTE: 6 - 22 (calc)   Sodium 138 135 - 146 mmol/L   Potassium 4.9 3.5 - 5.3 mmol/L   Chloride 103 98 - 110 mmol/L   CO2 29 20 - 32 mmol/L   Calcium 9.8 8.6 - 10.4 mg/dL   Total Protein 7.4 6.1 - 8.1 g/dL   Albumin 4.4 3.6 - 5.1 g/dL    Globulin 3.0 1.9 - 3.7 g/dL (calc)   AG Ratio 1.5 1.0 - 2.5 (calc)   Total Bilirubin 1.2 0.2 - 1.2 mg/dL   Alkaline phosphatase (APISO) 70 37 - 153 U/L   AST 13 10 - 35 U/L   ALT 11 6 - 29 U/L  T4, free   Collection Time: 05/01/24  9:34 AM  Result Value Ref Range   Free T4 2.0 (H) 0.8 - 1.8 ng/dL      Assessment & Plan:   Problem List Items Addressed This Visit   None Visit Diagnoses       Left hip pain    -  Primary   Relevant Medications   Baclofen  5 MG TABS   Other Relevant Orders   DG HIP UNILAT W OR W/O PELVIS 2-3 VIEWS LEFT     Chronic pain of left knee       Relevant Medications   Baclofen  5 MG TABS     Seasonal allergic rhinitis due to other allergic trigger       Relevant Medications   azelastine  (ASTELIN ) 0.1 % nasal spray     Need for COVID-19 vaccine       Relevant Medications   COVID-19 mRNA vaccine, Pfizer, (COMIRNATY) syringe        Left hip pain, likely muscular etiology Subacute left hip pain for 2-3 weeks, likely muscular due to absence of trauma and symptom nature.  Differential includes tendinitis or bursitis. Localized laterally over trochanteric bursa. Her exam and range of motion is intact and not suggestive of acetabular etiology  - Order left hip x-ray next week to assess for arthritic changes or structural issues. For baseline image - Pending review of X-ray and updates, will suggest likely referral to Dr. Alvia, sports medicine specialist - She prefers physical therapy and manual options instead of medication - Prescribe baclofen  5 mg for muscle relaxation as needed, with caution for drowsiness. - Continue ibuprofen for pain management as needed. - Encourage continuation of stretching exercises from previous physical therapy.  Left knee pain with swelling, possible tendinitis or meniscal pathology Chronic left knee pain with swelling, possibly tendinitis or meniscal pathology. Previous x-ray showed minimal arthritis. From 2024 - Also  consider referral for Knee as well - Consider referral to Dr. Alvia for evaluation of both left knee and hip, focusing on non-surgical management options. - Continue current pain management with ibuprofen and consider baclofen  for muscle relaxation as needed.  Allergic rhinitis with persistent symptoms despite therapy Persistent allergic rhinitis symptoms despite loratadine and Flonase , indicating need for treatment adjustment. - Prescribe azelastine  nasal spray, use up to two sprays twice daily. - Discuss potential use of Singulair if symptoms persist.  General Health Maintenance Discussed need for COVID vaccination and flu shot. - Provide prescription for COVID vaccination for pharmacy administration. - Encourage scheduling of flu shot.      Pending review of L Hip X-ray can refer to Sports med for both L Hip and L Knee  Orders Placed This Encounter  Procedures   DG HIP UNILAT W OR W/O PELVIS 2-3 VIEWS LEFT    Standing Status:   Future    Expiration Date:   10/09/2025    Reason for Exam (SYMPTOM  OR DIAGNOSIS REQUIRED):   subacute left hip pain several weeks, no trauma or injury, worse with walking activity    Preferred imaging location?:   [facility]    Radiology Contrast Protocol - do NOT remove file path:   [email or web address]    Meds ordered this encounter  Medications   Baclofen  5 MG TABS    Sig: Take 1 tablet (5 mg total) by mouth 3 (three) times daily as needed (muscle spasm).    Dispense:  30 tablet    Refill:  0   azelastine  (ASTELIN ) 0.1 % nasal spray    Sig: Place 2 sprays into both nostrils 2 (two) times daily. Use in each nostril as directed    Dispense:  30 mL    Refill:  12   COVID-19 mRNA vaccine, Pfizer, (COMIRNATY) syringe    Sig: Inject 0.3 mLs into the muscle once for 1 dose.    Dispense:  0.3 mL    Refill:  0    Approved at provider discretion.  Product selection permitted.    Follow up  plan: Return if symptoms worsen or fail to improve.   Marsa Officer, DO [facility] 08/09/2024, 10:50 AM

## 2024-08-09 NOTE — Patient Instructions (Addendum)
 Thank you for coming to the office today.    Please schedule a Follow-up Appointment to: Return if symptoms worsen or fail to improve.  If you have any other questions or concerns, please feel free to call the office or send a message through MyChart. You may also schedule an earlier appointment if necessary.  Additionally, you may be receiving a survey about your experience at our office within a few days to 1 week by e-mail or mail. We value your feedback.  Marsa Officer, DO [facility], [place]

## 2024-08-13 ENCOUNTER — Ambulatory Visit
Admission: RE | Admit: 2024-08-13 | Discharge: 2024-08-13 | Disposition: A | Discharge status: Home / Self Care | Payer: PRIVATE HEALTH INSURANCE | Source: Ambulatory Visit | Attending: Family Medicine | Admitting: Family Medicine

## 2024-08-13 DIAGNOSIS — M25552 Pain in left hip: Secondary | ICD-10-CM | POA: Diagnosis present

## 2024-08-19 ENCOUNTER — Ambulatory Visit: Payer: Self-pay | Admitting: Family Medicine

## 2024-08-19 ENCOUNTER — Ambulatory Visit: Payer: PRIVATE HEALTH INSURANCE | Admitting: Dermatology

## 2024-08-19 ENCOUNTER — Encounter: Payer: Self-pay | Admitting: Dermatology

## 2024-08-19 ENCOUNTER — Other Ambulatory Visit: Payer: Self-pay | Admitting: Dermatology

## 2024-08-19 DIAGNOSIS — L738 Other specified follicular disorders: Secondary | ICD-10-CM

## 2024-08-19 DIAGNOSIS — G8929 Other chronic pain: Secondary | ICD-10-CM

## 2024-08-19 DIAGNOSIS — L700 Acne vulgaris: Secondary | ICD-10-CM | POA: Diagnosis not present

## 2024-08-19 DIAGNOSIS — M25552 Pain in left hip: Secondary | ICD-10-CM

## 2024-08-19 NOTE — Patient Instructions (Signed)

## 2024-08-19 NOTE — Progress Notes (Signed)
   Follow-Up Visit   Subjective  Jeanne Velez is a 63 y.o. female who presents for the following: Acne face, Amzeeq  foam qd, Epiduo  Forte qhs, Winlevi  bid, Doxycycline  40mg  1 po qd, TheraClear q 2 wks, face much improved, pt also getting IPL for dry eyes but treating whole face which may be helping too.   The following portions of the chart were reviewed this encounter and updated as appropriate: medications, allergies, medical history  Review of Systems:  No other skin or systemic complaints except as noted in HPI or Assessment and Plan.  Objective  Well appearing patient in no apparent distress; mood and affect are within normal limits.   A focused examination was performed of the following areas: face  Relevant exam findings are noted in the Assessment and Plan.    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 FAILED isotretinoin  (headache), spironolactone  (low BP), clindamycin  (stopped working), doxycycline  (helps while on it but flares off it), intralesional steroid  face Exam: few closed comedones face  Chronic condition with duration or expected duration over one year. Currently well-controlled.   Treatment Plan: Cont Amzeeq  foam qam to face Cont Epiduo  Forte qhs to face Cont Winlevi  bid to face Cont Doxycycline  40mg  1 po qd with food and drink Cont TheraClear q 2 wks  Doxycycline  should be taken with food to prevent nausea. Do not lay down for 30 minutes after taking. Be cautious with sun exposure and use good sun protection while on this medication. Pregnant women should not take this medication.   Topical retinoid medications like tretinoin /Retin-A , adapalene /Differin , tazarotene/Fabior, and Epiduo /Epiduo  Forte can cause dryness and irritation when first started. Only apply a pea-sized amount to the entire affected area. Avoid applying it around the eyes, edges of mouth and creases at the nose. If you experience irritation, use a good  moisturizer first and/or apply the medicine less often. If you are doing well with the medicine, you can increase how often you use it until you are applying every night. Be careful with sun protection while using this medication as it can make you sensitive to the sun. This medicine should not be used by pregnant women.   Benzoyl peroxide  can cause dryness and irritation of the skin. It can also bleach fabric. When used together with Aczone  (dapsone ) cream, it can stain the skin orange.  Sebaceous Hyperplasia Discussed ED $350 for face >21 lesions - Small yellow papules with a central dell face - Benign-appearing - Observe. Call for changes.  Discussed cosmetic procedure ED, noncovered.  $60 for 1st lesion and $15 for each additional lesion if done on the same day.  Maximum charge $350.  One touch-up treatment included no charge. Discussed risks of treatment including dyspigmentation, small scar, and/or recurrence. Recommend daily broad spectrum sunscreen SPF 30+/photoprotection to treated areas once healed.    Return for ED to Sebaceous Hyperplasia needs cosmetic appt or surgery appt, 35m acne f/u.  I, Grayce Saunas, RMA, am acting as scribe for Rexene Rattler, MD .   Documentation: I have reviewed the above documentation for accuracy and completeness, and I agree with the above.  Rexene Rattler, MD

## 2024-08-20 ENCOUNTER — Ambulatory Visit: Payer: PRIVATE HEALTH INSURANCE

## 2024-08-21 ENCOUNTER — Ambulatory Visit: Payer: PRIVATE HEALTH INSURANCE

## 2024-08-22 ENCOUNTER — Ambulatory Visit (INDEPENDENT_AMBULATORY_CARE_PROVIDER_SITE_OTHER): Payer: PRIVATE HEALTH INSURANCE

## 2024-08-22 DIAGNOSIS — Z7189 Other specified counseling: Secondary | ICD-10-CM

## 2024-08-22 DIAGNOSIS — L700 Acne vulgaris: Secondary | ICD-10-CM

## 2024-08-22 NOTE — Progress Notes (Signed)
 Patient here today for a TheraClear session for acne vulgaris.    Treatment # 9 Treatment Area: Face and Neck Energy: 4 Vacuum: 2 Pulse: 750 Pulse Delay: double   Photos taken and placed in media.  Patient understands and agrees she has not taken any forms of Isotretinoin  within the last 4-6 months, she has not taken any Doxycycline  or used topical Retin-A .      Alan Pizza, RMA

## 2024-08-26 ENCOUNTER — Ambulatory Visit (INDEPENDENT_AMBULATORY_CARE_PROVIDER_SITE_OTHER): Payer: PRIVATE HEALTH INSURANCE | Admitting: Family Medicine

## 2024-08-26 ENCOUNTER — Encounter: Payer: Self-pay | Admitting: Family Medicine

## 2024-08-26 VITALS — BP 108/60 | HR 68 | Ht 68.0 in | Wt 178.0 lb

## 2024-08-26 DIAGNOSIS — M25552 Pain in left hip: Secondary | ICD-10-CM | POA: Diagnosis not present

## 2024-08-26 DIAGNOSIS — M7632 Iliotibial band syndrome, left leg: Secondary | ICD-10-CM

## 2024-08-26 DIAGNOSIS — M25562 Pain in left knee: Secondary | ICD-10-CM

## 2024-08-26 MED ORDER — CELECOXIB 50 MG PO CAPS: 50.0000 mg | ORAL_CAPSULE | Freq: Two times a day (BID) | ORAL | 0 refills | Status: DC | PRN

## 2024-08-26 NOTE — Progress Notes (Signed)
 Primary Care / Sports Medicine Office Visit  Patient Information:  Patient ID: Jeanne Velez, female DOB: 1961-10-15 Age: 63 y.o. MRN: [id number]   Jeanne Velez is a pleasant 63 y.o. female presenting with the following:  Chief Complaint  Patient presents with   Knee Pain    Left knee flares up with overuse. Patient will have swelling on lateral aspect of knee. She has tried home exercise program but those cause pain as well. She take ibuprofen when she is having a flare up and that seems to help. She uses ice and elevation as treatment as well. She has xray's for today's visit.   Hip Pain    Left hip started to catch when walking a few weeks. Sleeping on left side causes hip to be painful. In the last few weeks she has tried to take longer stride when she is doing her daily walk. She has tried PT in the past for her hip which did help.     Vitals:   08/26/24 0844  BP: 108/60  Pulse: 68  SpO2: 96%   Vitals:   08/26/24 0844  Weight: 178 lb (80.7 kg)  Height: 5' 8 (1.727 m)   Body mass index is 27.06 kg/m.  DG HIP UNILAT W OR W/O PELVIS 2-3 VIEWS LEFT Result Date: 08/18/2024 CLINICAL DATA:  Subacute left hip pain for several weeks. No known injury. EXAM: DG HIP (WITH OR WITHOUT PELVIS) 2-3V LEFT COMPARISON:  None Available. FINDINGS: The hip joint space is preserved. Mild acetabular spurring and subchondral cystic change laterally. Femoral head is well seated. No fracture. No evidence of erosion or avascular necrosis. Minor degenerative change of the pubic symphysis. No evidence of focal bone lesion or bone destruction. There are pelvic phleboliths. IMPRESSION: Mild osteoarthritis of the left hip. Electronically Signed   By: Andrea Gasman M.D.   On: 08/18/2024 11:42     Discussed the use of AI scribe software for clinical note transcription with the patient, who gave verbal consent to proceed.   Independent interpretation of notes and tests performed by another provider:    Independently reviewed left knee and hip radiographs with patient during today's office visit, significant for minimal inferior femoroacetabular joint space loss and left greater trochanteric cortical spurring most likely consistent with chronic traction/greater trochanteric pain syndrome, left knee with mild medial tibiofemoral space loss, lateral view of knee with superior and inferior patellar osteophytes and insertional enthesophyte at the quadriceps, no acute osseous processes identified  Procedures performed:   None  Pertinent History, Exam, Impression, and Recommendations:   Problem List Items Addressed This Visit     Greater 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 - Primary   History of Present Illness Jeanne Velez is a 63 year old female who presents with left hip and knee pain.  Left hip and thigh pain - Chronic pain for 5-7 years, described as tightness in the hips, glutes, and quads - Pain affects both sides but alternates in severity, currently more prominent on the left - Pain is anterolateral, radiates down the leg but not past the knee - Occasional numbness in the outer lower leg - Pain is worse at night, sometimes severe enough to disrupt sleep - Recent episode of popping and catching sensation in the outer hip during walking, not severely painful but concerning, occurs when increasing pace - Symptoms exacerbated by lack of consistent stretching - Joints sensitive to inflammatory foods, requiring dietary caution  Functional impact and contributing factors -  Sedentary occupation, previously involved extensive driving, now long hours at a computer - Initiated weight loss program a year and a half ago, resulting in approximately 50-pound weight loss - Started strength training twice a week, but inconsistent with stretching regimen - Pain and symptoms interfere with sleep and physical activity  Analgesic use and medication tolerance - Uses ibuprofen as  needed for pain, particularly at night to prevent sleep disruption - History of adverse reaction to Naprosyn, including heart racing and feeling faint - Tolerates ibuprofen well  LEFT HIP INSPECTION: Normal alignment without visible swelling, erythema, ecchymosis, or deformity. Skin intact. PALPATION: Focal tenderness at the left greater trochanter. Tenderness at the left sacroiliac joint. Equivocal tenderness at the left piriformis. Non-tender throughout the left gluteus medius distribution. No warmth, effusion, or bony abnormalities appreciated. RANGE OF MOTION: Full flexion, extension, abduction, and adduction with discomfort at the end range of internal rotation. STRENGTH: 5/5 strength in hip flexion, abduction, and adduction. SPECIAL TESTS: Positive Ober's test localizing to the mid-to-distal iliotibial band. Positive FABER test localizing to the lateral hip. Positive FADIR test reproducing lateral hip discomfort (not representative of stated symptoms). Negative straight-leg-raise test.  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 - Refer to physical therapy for stretching and strengthening exercises. - Prescribe Celebrex  100 mg twice daily as needed with food. - Consider corticosteroid injection if symptoms persist. - Educated on consistent stretching and strengthening to prevent recurrence.      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 - Refer to physical therapy for IT band stretching and strengthening. - Prescribe Celebrex   100 mg twice daily as needed with food. - Consider corticosteroid injection if symptoms persist. - Educated on consistent stretching and strengthening to prevent recurrence.      Relevant Medications   celecoxib  (CELEBREX ) 50 MG capsule   Other Relevant Orders   Ambulatory referral to Physical Therapy   Patellofemoral arthralgia of left knee   Left knee pain and swelling - Chronic left knee pain for 5-7 years, worsened a year and a half ago after participation in a boot camp - Pain associated with swelling and instability, primarily on the outer aspect of the knee - Orthopedist diagnosed tendinitis and recommended rest, which initially alleviated symptoms - Persistent pain with activities involving squatting or quadriceps training - Swelling occurs several times a month  Functional impact and contributing factors - Sedentary occupation, previously involved extensive driving, now long hours at a computer - Initiated weight loss program a year and a half ago, resulting in approximately 50-pound weight loss - Started strength training twice a week, but inconsistent with stretching regimen - Pain and symptoms interfere with sleep and physical activity  LEFT KNEE INSPECTION: Symmetric appearance without erythema or ecchymosis. No visible swelling or deformity. PALPATION: No palpable effusion. Lateral and medial patellar facets non-tender. Quadriceps and patellar tendons non-tender. Medial joint line non-tender. Focal tenderness at the anterolateral joint line. RANGE OF MOTION: Full range of motion with palpable dynamic maltracking of the patella during passive flexion-extension. No mechanical obstruction. STRENGTH: 5/5 strength in flexion and extension, painless. SPECIAL TESTS: Negative medial  and lateral McMurray's tests. No laxity with varus or valgus stressing.  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 - Refer to physical therapy for patellar tracking exercises and muscle strengthening. - Prescribe Celebrex  100 mg twice daily as needed with food. - Consider corticosteroid injection if symptoms persist. - Educated on consistent stretching and strengthening to prevent recurrence.      Relevant Medications   celecoxib  (CELEBREX ) 50 MG capsule   Other Relevant Orders   Ambulatory referral to Physical Therapy     Orders & Medications Medications:  Meds ordered this encounter  Medications   celecoxib  (CELEBREX ) 50 MG capsule    Sig: Take 1 capsule (50 mg total) by mouth 2 (two) times daily as needed for pain.    Dispense:  60 capsule    Refill:  0   Orders Placed This Encounter  Procedures   Ambulatory referral to Physical Therapy     No follow-ups on file.     Selinda JINNY Ku, MD, [facility]   Primary Care Sports Medicine Primary Care and Sports Medicine at [facility]

## 2024-08-26 NOTE — Assessment & Plan Note (Signed)
 History of Present Illness Jeanne Velez is a 63 year old female who presents with left hip and knee pain.  Left hip and thigh pain - Chronic pain for 5-7 years, described as tightness in the hips, glutes, and quads - Pain affects both sides but alternates in severity, currently more prominent on the left - Pain is anterolateral, radiates down the leg but not past the knee - Occasional numbness in the outer lower leg - Pain is worse at night, sometimes severe enough to disrupt sleep - Recent episode of popping and catching sensation in the outer hip during walking, not severely painful but concerning, occurs when increasing pace - Symptoms exacerbated by lack of consistent stretching - Joints sensitive to inflammatory foods, requiring dietary caution  Functional impact and contributing factors - Sedentary occupation, previously involved extensive driving, now long hours at a computer - Initiated weight loss program a year and a half ago, resulting in approximately 50-pound weight loss - Started strength training twice a week, but inconsistent with stretching regimen - Pain and symptoms interfere with sleep and physical activity  Analgesic use and medication tolerance - Uses ibuprofen as needed for pain, particularly at night to prevent sleep disruption - History of adverse reaction to Naprosyn, including heart racing and feeling faint - Tolerates ibuprofen well  LEFT HIP INSPECTION: Normal alignment without visible swelling, erythema, ecchymosis, or deformity. Skin intact. PALPATION: Focal tenderness at the left greater trochanter. Tenderness at the left sacroiliac joint. Equivocal tenderness at the left piriformis. Non-tender throughout the left gluteus medius distribution. No warmth, effusion, or bony abnormalities appreciated. RANGE OF MOTION: Full flexion, extension, abduction, and adduction with discomfort at the end range of internal rotation. STRENGTH: 5/5 strength in hip flexion,  abduction, and adduction. SPECIAL TESTS: Positive Ober's test localizing to the mid-to-distal iliotibial band. Positive FABER test localizing to the lateral hip. Positive FADIR test reproducing lateral hip discomfort (not representative of stated symptoms). Negative straight-leg-raise test.  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 - Refer to physical therapy for stretching and strengthening exercises. - Prescribe Celebrex  100 mg twice daily as needed with food. - Consider corticosteroid injection if symptoms persist. - Educated on consistent stretching and strengthening to prevent recurrence.

## 2024-08-26 NOTE — Patient Instructions (Signed)
 VISIT SUMMARY:  You visited us  today due to chronic pain in your left hip and knee, which has been affecting your daily activities and sleep. We discussed your symptoms, including the tightness and pain in your hip and knee, and the impact of your sedentary job and weight loss journey on your condition.  YOUR PLAN:  CHRONIC LEFT GREATER TROCHANTERIC PAIN SYNDROME: You have chronic pain and inflammation in your left hip due to a tight IT and tendons at the outer hip band causing friction. -You will start physical therapy for stretching and strengthening exercises. -Take Celebrex  100 mg twice daily with food as needed for pain. -If symptoms persist, we may consider a corticosteroid injection. -It's important to maintain a consistent stretching and strengthening routine to prevent recurrence.  CHRONIC LEFT ILIOTIBIAL BAND SYNDROME: You have chronic inflammation in your left hip and knee due to a tight IT band, contributing to your pain. -You will start physical therapy for IT band stretching and strengthening. -Take Celebrex  100 mg twice daily with food as needed for pain. -If symptoms persist, we may consider a corticosteroid injection. -It's important to maintain a consistent stretching and strengthening routine to prevent recurrence.  CHRONIC LEFT PATELLOFEMORAL PAIN SYNDROME WITH MILD OSTEOARTHRITIS: You have chronic pain in your left knee with mild osteoarthritis, worsened by a tight IT band affecting your knee cap's movement. -You will start physical therapy for patellar tracking exercises and muscle strengthening. -Take Celebrex  100 mg twice daily with food as needed for pain. -If symptoms persist, we may consider a corticosteroid injection. -It's important to maintain a consistent stretching and strengthening routine to prevent recurrence.

## 2024-08-26 NOTE — Assessment & Plan Note (Signed)
 Left knee pain and swelling - Chronic left knee pain for 5-7 years, worsened a year and a half ago after participation in a boot camp - Pain associated with swelling and instability, primarily on the outer aspect of the knee - Orthopedist diagnosed tendinitis and recommended rest, which initially alleviated symptoms - Persistent pain with activities involving squatting or quadriceps training - Swelling occurs several times a month  Functional impact and contributing factors - Sedentary occupation, previously involved extensive driving, now long hours at a computer - Initiated weight loss program a year and a half ago, resulting in approximately 50-pound weight loss - Started strength training twice a week, but inconsistent with stretching regimen - Pain and symptoms interfere with sleep and physical activity  LEFT KNEE INSPECTION: Symmetric appearance without erythema or ecchymosis. No visible swelling or deformity. PALPATION: No palpable effusion. Lateral and medial patellar facets non-tender. Quadriceps and patellar tendons non-tender. Medial joint line non-tender. Focal tenderness at the anterolateral joint line. RANGE OF MOTION: Full range of motion with palpable dynamic maltracking of the patella during passive flexion-extension. No mechanical obstruction. STRENGTH: 5/5 strength in flexion and extension, painless. SPECIAL TESTS: Negative medial and lateral McMurray's tests. No laxity with varus or valgus stressing.  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 - Refer to physical therapy for patellar tracking exercises and muscle strengthening. - Prescribe Celebrex  100 mg twice daily as needed with food. - Consider corticosteroid injection if symptoms persist. - Educated on consistent stretching and strengthening to prevent recurrence.

## 2024-08-26 NOTE — Assessment & Plan Note (Signed)
 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 - Refer to physical therapy for IT band stretching and strengthening. - Prescribe Celebrex  100 mg twice daily as needed with food. - Consider corticosteroid injection if symptoms persist. - Educated on consistent stretching and strengthening to prevent recurrence.

## 2024-09-04 ENCOUNTER — Encounter: Payer: Self-pay | Admitting: Dermatology

## 2024-09-04 MED ORDER — VALACYCLOVIR HCL 500 MG PO TABS: ORAL_TABLET | ORAL | 11 refills | Status: AC

## 2024-09-12 ENCOUNTER — Ambulatory Visit: Payer: PRIVATE HEALTH INSURANCE | Attending: Family Medicine

## 2024-09-12 DIAGNOSIS — G8929 Other chronic pain: Secondary | ICD-10-CM | POA: Insufficient documentation

## 2024-09-12 DIAGNOSIS — M7632 Iliotibial band syndrome, left leg: Secondary | ICD-10-CM | POA: Diagnosis not present

## 2024-09-12 DIAGNOSIS — R2689 Other abnormalities of gait and mobility: Secondary | ICD-10-CM | POA: Insufficient documentation

## 2024-09-12 DIAGNOSIS — M6281 Muscle weakness (generalized): Secondary | ICD-10-CM | POA: Diagnosis present

## 2024-09-12 DIAGNOSIS — M25562 Pain in left knee: Secondary | ICD-10-CM | POA: Diagnosis present

## 2024-09-12 DIAGNOSIS — M25552 Pain in left hip: Secondary | ICD-10-CM | POA: Insufficient documentation

## 2024-09-12 NOTE — Therapy (Unsigned)
 OUTPATIENT PHYSICAL THERAPY HIP EVALUATION/TREATMENT   Patient Name: Jeanne Velez MRN: [id number] DOB:[birth year], 63 y.o., female Today's Date: 09/13/2024  END OF SESSION:  PT End of Session - 09/12/24 1435     Visit Number 1    Number of Visits 25    Date for Recertification  12/05/24    PT Start Time 1435    PT Stop Time 1515    PT Time Calculation (min) 40 min    Activity Tolerance Patient tolerated treatment well    Behavior During Therapy [facility] for tasks assessed/performed          Past Medical History:  Diagnosis Date   Acne    Allergy    Anemia    Back pain    BRCA negative 07/2020   MyRisk neg   Cataracts, bilateral    Colon polyp    Constipation    Family history of breast cancer 07/2020   IBIS=15.1%/riskscore=6.7%   Family history of skin cancer 02/24/2022   Folliculitis    GERD (gastroesophageal reflux disease)    Hyperlipidemia    Hypothyroidism    Joint pain    Macular degeneration    Osteoarthritis    Retinal vasculitis    Stress    Thyroid cancer (HCC)    Past Surgical History:  Procedure Laterality Date   ABDOMINAL HYSTERECTOMY     BREAST BIOPSY Right    yrs ago benign, no marker   COLONOSCOPY WITH PROPOFOL  N/A 12/14/2022   Procedure: COLONOSCOPY WITH PROPOFOL ;  Surgeon: Unk Corinn Skiff, MD;  Location: [facility] ENDOSCOPY;  Service: Gastroenterology;  Laterality: N/A;   FLEXIBLE SIGMOIDOSCOPY N/A 04/28/2021   Procedure: FLEXIBLE SIGMOIDOSCOPY;  Surgeon: Unk Corinn Skiff, MD;  Location: [facility] ENDOSCOPY;  Service: Gastroenterology;  Laterality: N/A;   ovarial cystectomy     THYROIDECTOMY     TOTAL ABDOMINAL HYSTERECTOMY W/ BILATERAL SALPINGOOPHORECTOMY     dermoids, ovar cysts   TUBAL LIGATION     Patient Active Problem List   Diagnosis Date Noted   Greater 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 08/26/2024   It band syndrome, left 08/26/2024   Patellofemoral arthralgia of left knee 08/26/2024   Polyp of ascending colon  12/14/2022   Rectal polyp 12/14/2022   Genetic testing 04/12/2022   Family history of skin cancer 02/24/2022   Stress 12/14/2021   Pre-diabetes 06/28/2021   Vitamin D  deficiency 05/04/2021   Class 1 obesity with serious comorbidity and body mass index (BMI) of 33.0 to 33.9 in adult 05/04/2021   History of colonic polyps    Vasomotor symptoms due to menopause 07/28/2020   Hypothyroidism 07/24/2020   Mixed hyperlipidemia 07/24/2020   Family history of breast cancer 07/24/2020   Arthritis 07/24/2020   History of thyroid cancer 07/24/2020   Close exposure to COVID-19 virus 04/09/2020    PCP: Edman Marsa PARAS, DO  REFERRING PROVIDER: Alvia Selinda PARAS, MD  REFERRING DIAG:  [phone] (ICD-10-CM) - Greater 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  M76.32 (ICD-10-CM) - It band syndrome, left  M25.562 (ICD-10-CM) - Patellofemoral arthralgia of left knee    RATIONALE FOR EVALUATION AND TREATMENT: Rehabilitation  THERAPY DIAG: Chronic pain of left knee  Pain in left hip  Other abnormalities of gait and mobility  Muscle weakness (generalized)  ONSET DATE: Chronic (> 5 Years)  FOLLOW-UP APPT SCHEDULED WITH REFERRING PROVIDER: Yes    SUBJECTIVE:  SUBJECTIVE STATEMENT:    Patient is a 63 year old female presenting to OPPT with a chief concern of Knee and Hip pain.   PERTINENT HISTORY:   Jeanne Velez is a 63 year old female presenting with L hip and knee pain. Patient reports that her pain has been persistent for 5 years and she has previously seen a PT for the pain with moderate relief. She reports going through a weight loss journey. Last year she did a boot camp exercise class and following the course her L knee had some pain. 2 mos ago she was walking on a TM and she noticed her L  hip had a popping sensation. She reports an aching pain throughout the day. She states that the pain will wake her periodically and she has to straighten the leg in order to relieve the symptoms. She reports intermittent numbness/tingling in the L lower leg.  Prior to the recent exacerbation, she was very active and worked with a psychologist, educational 2x/week. She also participated in exercises at the gym 3-4 times a week. She does report if she isn't working she reports a sedentary lifestyle (sitting for about 8-10 hours)   She denies loss of b/b, saddle parasthesia, chills, fevers, nausea.  PAIN:    Pain Intensity: Present: 1-2/10, Best: 0/10, Worst: 5-6/10 Pain location: L Hip and L Knee Pain Quality: intermittent, constant, and aching  Radiating: Yes  Numbness/Tingling: Yes How long can you stand:  History of prior back or hip injury, pain, surgery, or therapy: Yes   PRECAUTIONS: Fall  WEIGHT BEARING RESTRICTIONS: No  FALLS: Has patient fallen in last 6 months? No  Living Environment Lives with: lives with their spouse Lives in: House/apartment Stairs: Yes: Internal: 16 steps; on right going up and External: 5-6 steps; can reach both Has following equipment at home: None  Prior level of function: Independent  Hobbies: Gardening, Outdoor woman, Exercises Classes, Pets, Reading   Patient Goals: I would like to reduce pain, increase strength, ROM and flexibility   OBJECTIVE:  Patient Surveys:  Cognition Patient is oriented to person, place, and time.  Recent memory is intact.  Attention span and concentration are intact.     Gross Musculoskeletal Assessment Tremor: None; Bulk: Normal, no muscle wasting noted; Tone: Normal; No visible step-off along spinal column, no signs of scoliosis, no gross anatomical hip deformities;   GAIT: Distance walked: 20 m  Assistive device utilized: None Level of assistance: Complete Independence Comments:   Posture: Lumbar lordosis:  WNL Iliac crest height: Equal bilaterally Lumbar lateral shift: Negative Leg length: Equal bilaterally;  AROM AROM (Normal range in degrees) AROM   Lumbar   Flexion (65) WNL   Extension (30) WNL   Right lateral flexion (25) WNL  Left lateral flexion (25) WNL  Right rotation (30) WNL  Left rotation (30) WNL      Hip Right Left  Flexion (125) WNL WNL  Extension (15)    Abduction (40)    Adduction (30)    Internal Rotation (45) [facility]  External Rotation (45) WFL WFL      Knee    Flexion (135) WNL WNL  Extension (0) WNL WNL      Ankle    Dorsiflexion (20)    Plantarflexion (50)    Inversion (35)    Eversion (15)    (* = pain; Blank rows = not tested)  LE MMT: MMT (out of 5) Right  Left   Hip flexion 4- 4-  Hip  extension    Hip abduction 4 4-*  Hip adduction    Hip internal rotation 4 4  Hip external rotation 4 4  Knee flexion 4- 4-  Knee extension 4 3+  Ankle dorsiflexion 5 5  Ankle plantarflexion 5 5  Ankle inversion    Ankle eversion    (* = pain; Blank rows = not tested)  Sensation Grossly intact to light touch throughout bilateral LEs as determined by testing dermatomes L2-S2. Proprioception, stereognosis, and hot/cold testing deferred on this date.  Reflexes R/L Knee Jerk (L3/4): 2+/2+    Muscle Length Hamstrings: R: Negative L: Negative  Palpation Location Right Left         Lumbar paraspinals  0  Quadratus Lumborum  0  Iliac Crest  0  ASIS  0  Pubic Rami    Pubic symphysis   Femoral Triangle    Inguinal ligament    Gluteus Maximus  2  Gluteus Medius  2  Deep hip external rotators  2  Sacrum 0  PSIS    Fortin's Area (SIJ)  0  Coccyx   IT BAND   2  Greater Trochanter  2  (Blank rows = not tested) Graded on 0-4 scale (0 = no pain, 1 = pain, 2 = pain with wincing/grimacing/flinching, 3 = pain with withdrawal, 4 = unwilling to allow palpation)  Passive Accessory Intervertebral Motion Deferred  Special Tests Lumbar Radiculopathy  and Discogenic: SLR (SN 92, -LR 0.29): R: Negative L:  Negative Crossed SLR (SP 90): R: Negative L: Negative  Facet Joint: Extension-Rotation (SN 100, -LR 0.0): R: Negative L: Negative  Hip: FABER (SN 81): R: Not Tested L: Negative FADIR (SN 94): R: Not Tested L: Negative  Functional Movement:   Sit To Stand: WNL Stairs: WNL  Squatting: 5 reps, Hip IR/ADD noted in LLE, less weight shift on LLE, narrow Bos. Pt reported moderate weakness in L knee following 5 repetitions   TODAY'S TREATMENT:  Reviewed HEP - PT assisted with stretches:   Supine Gluteal Stretch - 1 x daily - 7 x weekly - 3 sets - 30-60 hold  Supine Piriformis Stretch  - 1 x daily - 7 x weekly - 3 sets - 30-60 hold  Sidelying Clamshell with resistance - 1 x daily - 3-4 x weekly - 2-3 sets - 10-12 reps  PATIENT EDUCATION:  Education details: HEP Person educated: Patient Education method: Explanation, Demonstration, and Handouts Education comprehension: verbalized understanding, returned demonstration, and tactile cues required   HOME EXERCISE PROGRAM:   Access Code: [email or web address] URL: [email or web address] Date: 09/12/2024 Prepared by: Lonni Pall  Exercises - Clamshell with Resistance  - 1 x daily - 3-4 x weekly - 2-3 sets - 10-12 reps - Supine Bridge  - 1 x daily - 3-4 x weekly - 2-3 sets - 10-12 reps - Mini Squat with Counter Support  - 1 x daily - 3-4 x weekly - 2-3 sets - 10-12 reps - Supine Gluteus Stretch  - 1 x daily - 7 x weekly - 3 sets - 30-60 hold - Supine Piriformis Stretch  - 1 x daily - 7 x weekly - 3 sets - 30-60 hold   ASSESSMENT:  CLINICAL IMPRESSION: Patient is a 63 y.o. female who was seen today for physical therapy evaluation and treatment for L hip pain. Patient presenting with decreased LE strength however gross ROM within functional limitations. Significant limitations in L knee extension against resistance in comparison to the RLE. Additionally resisted hip abduction  with  concordant pain along greater trochanter. Patient tender to palpation at IT band, gluteal muscles, and piriformis muscles with visible discomfort. Palpation of aforementioned structures significant for increased muscle tension. Gait pattern and stairway navigation without significant abnormalities. Time spent reviewing HEP with return demonstration of stretch techniques in order to reduce muscle tension in gluteal muscles. 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 Patient would benefit from skilled PT services in order to address current deficits and maximize return to PLOF.  OBJECTIVE IMPAIRMENTS: Abnormal gait, decreased activity tolerance, decreased endurance, difficulty walking, decreased strength, increased muscle spasms, impaired sensation, and pain.   ACTIVITY LIMITATIONS: lifting, bending, standing, squatting, sleeping, stairs, and transfers  PARTICIPATION LIMITATIONS: community activity, occupation, and yard work  PERSONAL FACTORS: Age, Fitness, Past/current experiences, Profession, and Time since onset of injury/illness/exacerbation are also affecting patient's functional outcome.   REHAB POTENTIAL: Good  CLINICAL DECISION MAKING: Evolving/moderate complexity  EVALUATION COMPLEXITY: Moderate   GOALS: Goals reviewed with patient? No  SHORT TERM GOALS: Target date: 10/24/2024  Pt will be independent with HEP in order to improve strength and decrease hip pain to improve pain-free function at home and work. Baseline: 09/12/2024: Initial HEP provided  Goal status: INITIAL   LONG TERM GOALS: Target date: 12/05/2024  Pt will increase LEFS by at least 9 points in order to demonstrate significant improvement in lower extremity function.  Baseline: 09/12/2024: 64/80 Goal status: INITIAL  2.  Pt will decrease worst hip pain by at least 3 points on the NPRS in order to demonstrate clinically significant reduction in hip pain. Baseline: 09/12/2024:  6/10 Goal status: INITIAL  3.  Pt will be able to perform 10 squats with proper mechanics and minimal pain (< 3 NPS) in order to demonstrate significant improvements in function and strength for hobbies.     Baseline: 09/12/2024: 5 squats, moderate L knee pain (5/10) Goal status: INITIAL  4.  Pt will increase R/L LE strength by at least 1/2 MMT  (I.e. 4 > 4+) grade in order to demonstrate improvement in strength and function for hobbies and walking.  Baseline: 09/12/2024: see above Goal status: INITIAL  5. Pt will achieve 6-8 hours of sleep per night without report of awakening from hip pain in order to demonstrate significant improvements in pain.  Baseline: 09/12/2024: n/a Goal status: INITIAL   PLAN: PT FREQUENCY: 1-2x/week  PT DURATION: 12 weeks  PLANNED INTERVENTIONS: Therapeutic exercises, Therapeutic activity, Neuromuscular re-education, Balance training, Gait training, Joint mobilization, Electrical stimulation, Cryotherapy, Moist heat, Taping, Manual therapy, and Re-evaluation.  PLAN FOR NEXT SESSION: Review HEP, Initiate hip strengthening, knee strengthening, hip stretches (IT band)    Lonni Pall PT, DPT Physical Therapist- [place]  09/13/2024, 12:07 AM

## 2024-09-17 ENCOUNTER — Ambulatory Visit: Payer: PRIVATE HEALTH INSURANCE | Attending: Family Medicine

## 2024-09-17 DIAGNOSIS — M6281 Muscle weakness (generalized): Secondary | ICD-10-CM | POA: Diagnosis present

## 2024-09-17 DIAGNOSIS — G8929 Other chronic pain: Secondary | ICD-10-CM | POA: Insufficient documentation

## 2024-09-17 DIAGNOSIS — M25552 Pain in left hip: Secondary | ICD-10-CM | POA: Diagnosis present

## 2024-09-17 DIAGNOSIS — R2689 Other abnormalities of gait and mobility: Secondary | ICD-10-CM | POA: Diagnosis present

## 2024-09-17 DIAGNOSIS — M25562 Pain in left knee: Secondary | ICD-10-CM | POA: Insufficient documentation

## 2024-09-17 NOTE — Therapy (Signed)
 OUTPATIENT PHYSICAL THERAPY HIP/KNEE TREATMENT   Patient Name: Jeanne Velez MRN: [id number] DOB:10-Sep-1961, 63 y.o., female Today's Date: 09/17/2024  END OF SESSION:  PT End of Session - 09/17/24 0814     Visit Number 2    Number of Visits 25    Date for Recertification  12/05/24    PT Start Time 0815    PT Stop Time 0900    PT Time Calculation (min) 45 min    Activity Tolerance Patient tolerated treatment well    Behavior During Therapy [facility] for tasks assessed/performed           Past Medical History:  Diagnosis Date   Acne    Allergy    Anemia    Back pain    BRCA negative 07/2020   MyRisk neg   Cataracts, bilateral    Colon polyp    Constipation    Family history of breast cancer 07/2020   IBIS=15.1%/riskscore=6.7%   Family history of skin cancer 02/24/2022   Folliculitis    GERD (gastroesophageal reflux disease)    Hyperlipidemia    Hypothyroidism    Joint pain    Macular degeneration    Osteoarthritis    Retinal vasculitis    Stress    Thyroid cancer (HCC)    Past Surgical History:  Procedure Laterality Date   ABDOMINAL HYSTERECTOMY     BREAST BIOPSY Right    yrs ago benign, no marker   COLONOSCOPY WITH PROPOFOL  N/A 12/14/2022   Procedure: COLONOSCOPY WITH PROPOFOL ;  Surgeon: Jeanne Jeanne Skiff, MD;  Location: [facility] ENDOSCOPY;  Service: Gastroenterology;  Laterality: N/A;   FLEXIBLE SIGMOIDOSCOPY N/A 04/28/2021   Procedure: FLEXIBLE SIGMOIDOSCOPY;  Surgeon: Jeanne Jeanne Skiff, MD;  Location: [facility] ENDOSCOPY;  Service: Gastroenterology;  Laterality: N/A;   ovarial cystectomy     THYROIDECTOMY     TOTAL ABDOMINAL HYSTERECTOMY W/ BILATERAL SALPINGOOPHORECTOMY     dermoids, ovar cysts   TUBAL LIGATION     Patient Active Problem List   Diagnosis Date Noted   Greater 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 08/26/2024   It band syndrome, left 08/26/2024   Patellofemoral arthralgia of left knee 08/26/2024   Polyp of ascending colon 12/14/2022    Rectal polyp 12/14/2022   Genetic testing 04/12/2022   Family history of skin cancer 02/24/2022   Stress 12/14/2021   Pre-diabetes 06/28/2021   Vitamin D  deficiency 05/04/2021   Class 1 obesity with serious comorbidity and body mass index (BMI) of 33.0 to 33.9 in adult 05/04/2021   History of colonic polyps    Vasomotor symptoms due to menopause 07/28/2020   Hypothyroidism 07/24/2020   Mixed hyperlipidemia 07/24/2020   Family history of breast cancer 07/24/2020   Arthritis 07/24/2020   History of thyroid cancer 07/24/2020   Close exposure to COVID-19 virus 04/09/2020    PCP: Jeanne Marsa PARAS, DO  REFERRING PROVIDER: Alvia Selinda PARAS, MD  REFERRING DIAG:  [phone] (ICD-10-CM) - Greater 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  M76.32 (ICD-10-CM) - It band syndrome, left  M25.562 (ICD-10-CM) - Patellofemoral arthralgia of left knee    RATIONALE FOR EVALUATION AND TREATMENT: Rehabilitation  THERAPY DIAG: Pain in left hip  Other abnormalities of gait and mobility  Chronic pain of left knee  Muscle weakness (generalized)  ONSET DATE: Chronic (> 5 Years)  FOLLOW-UP APPT SCHEDULED WITH REFERRING PROVIDER: Yes    SUBJECTIVE:  SUBJECTIVE STATEMENT:    Patient is a 63 year old female presenting to OPPT with a chief concern of Knee and Hip pain.   PERTINENT HISTORY:   Jeanne Velez is a 63 year old female presenting with L hip and knee pain. Patient reports that her pain has been persistent for 5 years and she has previously seen a PT for the pain with moderate relief. She reports going through a weight loss journey. Last year she did a boot camp exercise class and following the course her L knee had some pain. 2 mos ago she was walking on a TM and she noticed her L hip had a  popping sensation. She reports an aching pain throughout the day. She states that the pain will wake her periodically and she has to straighten the leg in order to relieve the symptoms. She reports intermittent numbness/tingling in the L lower leg.  Prior to the recent exacerbation, she was very active and worked with a psychologist, educational 2x/week. She also participated in exercises at the gym 3-4 times a week. She does report if she isn't working she reports a sedentary lifestyle (sitting for about 8-10 hours)   She denies loss of b/b, saddle parasthesia, chills, fevers, nausea.  PAIN:    Pain Intensity: Present: 1-2/10, Best: 0/10, Worst: 5-6/10 Pain location: L Hip and L Knee Pain Quality: intermittent, constant, and aching  Radiating: Yes  Numbness/Tingling: Yes How long can you stand:  History of prior back or hip injury, pain, surgery, or therapy: Yes   PRECAUTIONS: Fall  WEIGHT BEARING RESTRICTIONS: No  FALLS: Has patient fallen in last 6 months? No  Living Environment Lives with: lives with their spouse Lives in: House/apartment Stairs: Yes: Internal: 16 steps; on right going up and External: 5-6 steps; can reach both Has following equipment at home: None  Prior level of function: Independent  Hobbies: Gardening, Outdoor woman, Exercises Classes, Pets, Reading   Patient Goals: I would like to reduce pain, increase strength, ROM and flexibility   OBJECTIVE:  Patient Surveys:  Cognition Patient is oriented to person, place, and time.  Recent memory is intact.  Attention span and concentration are intact.     Gross Musculoskeletal Assessment Tremor: None; Bulk: Normal, no muscle wasting noted; Tone: Normal; No visible step-off along spinal column, no signs of scoliosis, no gross anatomical hip deformities;   GAIT: Distance walked: 20 m  Assistive device utilized: None Level of assistance: Complete Independence Comments:   Posture: Lumbar lordosis: WNL Iliac  crest height: Equal bilaterally Lumbar lateral shift: Negative Leg length: Equal bilaterally;  AROM AROM (Normal range in degrees) AROM   Lumbar   Flexion (65) WNL   Extension (30) WNL   Right lateral flexion (25) WNL  Left lateral flexion (25) WNL  Right rotation (30) WNL  Left rotation (30) WNL      Hip Right Left  Flexion (125) WNL WNL  Extension (15)    Abduction (40)    Adduction (30)    Internal Rotation (45) [facility]  External Rotation (45) WFL WFL      Knee    Flexion (135) WNL WNL  Extension (0) WNL WNL      Ankle    Dorsiflexion (20)    Plantarflexion (50)    Inversion (35)    Eversion (15)    (* = pain; Blank rows = not tested)  LE MMT: MMT (out of 5) Right  Left   Hip flexion 4- 4-  Hip  extension    Hip abduction 4 4-*  Hip adduction    Hip internal rotation 4 4  Hip external rotation 4 4  Knee flexion 4- 4-  Knee extension 4 3+  Ankle dorsiflexion 5 5  Ankle plantarflexion 5 5  Ankle inversion    Ankle eversion    (* = pain; Blank rows = not tested)  Sensation Grossly intact to light touch throughout bilateral LEs as determined by testing dermatomes L2-S2. Proprioception, stereognosis, and hot/cold testing deferred on this date.  Reflexes R/L Knee Jerk (L3/4): 2+/2+    Muscle Length Hamstrings: R: Negative L: Negative  Palpation Location Right Left         Lumbar paraspinals  0  Quadratus Lumborum  0  Iliac Crest  0  ASIS  0  Pubic Rami    Pubic symphysis   Femoral Triangle    Inguinal ligament    Gluteus Maximus  2  Gluteus Medius  2  Deep hip external rotators  2  Sacrum 0  PSIS    Fortin's Area (SIJ)  0  Coccyx   IT BAND   2  Greater Trochanter  2  (Blank rows = not tested) Graded on 0-4 scale (0 = no pain, 1 = pain, 2 = pain with wincing/grimacing/flinching, 3 = pain with withdrawal, 4 = unwilling to allow palpation)  Passive Accessory Intervertebral Motion Deferred  Special Tests Lumbar Radiculopathy and  Discogenic: SLR (SN 92, -LR 0.29): R: Negative L:  Negative Crossed SLR (SP 90): R: Negative L: Negative  Facet Joint: Extension-Rotation (SN 100, -LR 0.0): R: Negative L: Negative  Hip: FABER (SN 81): R: Not Tested L: Negative FADIR (SN 94): R: Not Tested L: Negative  Functional Movement:   Sit To Stand: WNL Stairs: WNL  Squatting: 5 reps, Hip IR/ADD noted in LLE, less weight shift on LLE, narrow Bos. Pt reported moderate weakness in L knee following 5 repetitions   TODAY'S TREATMENT: DATE: 09/17/24  Subjective: 3-4/10 pain in the L hip prior to today's session. No question or concerns.  Therapeutic Exercise:  Supine Bridges with resistance   1 x 12 - Blue TB around thigh  1 x 12 - Blue TB around thigh   L Sidelying Clamshell   1 x 12 - Green TB   1 x 12 - Blue TB   R Lock Clamshell   2 x 12 - AROM     Supine IT Band Stretch   30s/bout x 2 in order  tissue extensibility  Supine Gluteal Stretch   30s/bout x 2 in order tissue extensibility  Supine hip Flexor Stretch  30s/bout x 2 in order tissue extensibility  PT education on updated HEP and additional exercises/stretches with proper technique.   Therapeutic Activity:  NuStep L5-2 x 6 min x UE/LE (Seat 10) for LE endurance and strength for capacity of walking; PT manually adjusted resistance throughout bout. Minor pain in the R patellar tendon.   Lateral Stepping with Resistance (seated rest break in b/t sets)  2 x 12' - Blue TB around knees - too easy   4 x 12' - Blue TB around ankles   4 x 12' - Blue TB   Lateral Step Down   R/L: 2 x 12 ea - Single UE support - Better control in RLE      PATIENT EDUCATION:  Education details: HEP Person educated: Patient Education method: Explanation, Demonstration, and Handouts Education comprehension: verbalized understanding, returned demonstration, and tactile cues required  HOME EXERCISE PROGRAM:   Access Code: [email or web address] URL:  [email or web address] Date: 09/12/2024 Prepared by: Lonni Pall  Exercises - Clamshell with Resistance  - 1 x daily - 3-4 x weekly - 2-3 sets - 10-12 reps - Supine Bridge  - 1 x daily - 3-4 x weekly - 2-3 sets - 10-12 reps - Mini Squat with Counter Support  - 1 x daily - 3-4 x weekly - 2-3 sets - 10-12 reps - Supine Gluteus Stretch  - 1 x daily - 7 x weekly - 3 sets - 30-60 hold - Supine Piriformis Stretch  - 1 x daily - 7 x weekly - 3 sets - 30-60 hold   ASSESSMENT:  CLINICAL IMPRESSION: Patient reported to OPPT for first f/u in management for left hip pain. Patient tolerated initial gluteal strengthening exercise with mild to no pain. PT focused on quadricep strengthening in functional movements with good return demonstration. Additional stretches provided to address anterior hip tightness with good return. 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 Patient's pain still continues to limit her from full participation with recreational activities and working out with her training. Based on today's performance, pt will continue to benefit from skilled PT in order to facilitate return to PLOF and improve QoL.  OBJECTIVE IMPAIRMENTS: Abnormal gait, decreased activity tolerance, decreased endurance, difficulty walking, decreased strength, increased muscle spasms, impaired sensation, and pain.   ACTIVITY LIMITATIONS: lifting, bending, standing, squatting, sleeping, stairs, and transfers  PARTICIPATION LIMITATIONS: community activity, occupation, and yard work  PERSONAL FACTORS: Age, Fitness, Past/current experiences, Profession, and Time since onset of injury/illness/exacerbation are also affecting patient's functional outcome.   REHAB POTENTIAL: Good  CLINICAL DECISION MAKING: Evolving/moderate complexity  EVALUATION COMPLEXITY: Moderate   GOALS: Goals reviewed with patient? No  SHORT TERM GOALS:  Target date: 10/24/2024  Pt will be independent with HEP in order to improve strength and decrease hip pain to improve pain-free function at home and work. Baseline: 09/12/2024: Initial HEP provided  Goal status: INITIAL   LONG TERM GOALS: Target date: 12/05/2024  Pt will increase LEFS by at least 9 points in order to demonstrate significant improvement in lower extremity function.  Baseline: 09/12/2024: 64/80 Goal status: INITIAL  2.  Pt will decrease worst hip pain by at least 3 points on the NPRS in order to demonstrate clinically significant reduction in hip pain. Baseline: 09/12/2024: 6/10 Goal status: INITIAL  3.  Pt will be able to perform 10 squats with proper mechanics and minimal pain (< 3 NPS) in order to demonstrate significant improvements in function and strength for hobbies.     Baseline: 09/12/2024: 5 squats, moderate L knee pain (5/10) Goal status: INITIAL  4.  Pt will increase R/L LE strength by at least 1/2 MMT  (I.e. 4 > 4+) grade in order to demonstrate improvement in strength and function for hobbies and walking.  Baseline: 09/12/2024: see above Goal status: INITIAL  5. Pt will achieve 6-8 hours of sleep per night without report of awakening from hip pain in order to demonstrate significant improvements in pain.  Baseline: 09/12/2024: n/a Goal status: INITIAL   PLAN: PT FREQUENCY: 1-2x/week  PT DURATION: 12 weeks  PLANNED INTERVENTIONS: Therapeutic exercises, Therapeutic activity, Neuromuscular re-education, Balance training, Gait training, Joint mobilization, Electrical stimulation, Cryotherapy, Moist heat, Taping, Manual therapy, and Re-evaluation.  PLAN FOR NEXT SESSION: Review HEP, Initiate hip strengthening, knee strengthening, hip stretches (IT band)    Lonni Pall PT, DPT  Physical Therapist- [place]  09/17/2024, 10:20 AM

## 2024-09-18 ENCOUNTER — Ambulatory Visit (INDEPENDENT_AMBULATORY_CARE_PROVIDER_SITE_OTHER): Payer: PRIVATE HEALTH INSURANCE

## 2024-09-18 DIAGNOSIS — L700 Acne vulgaris: Secondary | ICD-10-CM

## 2024-09-18 DIAGNOSIS — Z7189 Other specified counseling: Secondary | ICD-10-CM

## 2024-09-18 NOTE — Progress Notes (Signed)
 Patient here today for a TheraClear session for acne vulgaris.    Treatment # 10 Treatment Area: Face and Neck Energy: 4 Vacuum: 2 Pulse: 750 Pulse Delay: double   Photos taken and placed in media.  Patient understands and agrees she has not taken any forms of Isotretinoin  within the last 4-6 months, she has not taken any Doxycycline  or used topical Retin-A .      Alan Pizza, RMA

## 2024-09-19 ENCOUNTER — Ambulatory Visit: Payer: PRIVATE HEALTH INSURANCE

## 2024-09-19 DIAGNOSIS — M25552 Pain in left hip: Secondary | ICD-10-CM

## 2024-09-19 DIAGNOSIS — G8929 Other chronic pain: Secondary | ICD-10-CM

## 2024-09-19 DIAGNOSIS — M6281 Muscle weakness (generalized): Secondary | ICD-10-CM

## 2024-09-19 DIAGNOSIS — R2689 Other abnormalities of gait and mobility: Secondary | ICD-10-CM

## 2024-09-19 NOTE — Therapy (Signed)
 OUTPATIENT PHYSICAL THERAPY HIP/KNEE TREATMENT   Patient Name: Jeanne Velez MRN: [id number] DOB:11-Jan-1961, 63 y.o., female Today's Date: 09/19/2024  END OF SESSION:  PT End of Session - 09/19/24 1118     Visit Number 3    Number of Visits 25    Date for Recertification  12/05/24    PT Start Time 1117    PT Stop Time 1200    PT Time Calculation (min) 43 min    Activity Tolerance Patient tolerated treatment well    Behavior During Therapy [facility] for tasks assessed/performed           Past Medical History:  Diagnosis Date   Acne    Allergy    Anemia    Back pain    BRCA negative 07/2020   MyRisk neg   Cataracts, bilateral    Colon polyp    Constipation    Family history of breast cancer 07/2020   IBIS=15.1%/riskscore=6.7%   Family history of skin cancer 02/24/2022   Folliculitis    GERD (gastroesophageal reflux disease)    Hyperlipidemia    Hypothyroidism    Joint pain    Macular degeneration    Osteoarthritis    Retinal vasculitis    Stress    Thyroid cancer (HCC)    Past Surgical History:  Procedure Laterality Date   ABDOMINAL HYSTERECTOMY     BREAST BIOPSY Right    yrs ago benign, no marker   COLONOSCOPY WITH PROPOFOL  N/A 12/14/2022   Procedure: COLONOSCOPY WITH PROPOFOL ;  Surgeon: Unk Corinn Skiff, MD;  Location: [facility] ENDOSCOPY;  Service: Gastroenterology;  Laterality: N/A;   FLEXIBLE SIGMOIDOSCOPY N/A 04/28/2021   Procedure: FLEXIBLE SIGMOIDOSCOPY;  Surgeon: Unk Corinn Skiff, MD;  Location: [facility] ENDOSCOPY;  Service: Gastroenterology;  Laterality: N/A;   ovarial cystectomy     THYROIDECTOMY     TOTAL ABDOMINAL HYSTERECTOMY W/ BILATERAL SALPINGOOPHORECTOMY     dermoids, ovar cysts   TUBAL LIGATION     Patient Active Problem List   Diagnosis Date Noted   Greater 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 08/26/2024   It band syndrome, left 08/26/2024   Patellofemoral arthralgia of left knee 08/26/2024   Polyp of ascending colon 12/14/2022    Rectal polyp 12/14/2022   Genetic testing 04/12/2022   Family history of skin cancer 02/24/2022   Stress 12/14/2021   Pre-diabetes 06/28/2021   Vitamin D  deficiency 05/04/2021   Class 1 obesity with serious comorbidity and body mass index (BMI) of 33.0 to 33.9 in adult 05/04/2021   History of colonic polyps    Vasomotor symptoms due to menopause 07/28/2020   Hypothyroidism 07/24/2020   Mixed hyperlipidemia 07/24/2020   Family history of breast cancer 07/24/2020   Arthritis 07/24/2020   History of thyroid cancer 07/24/2020   Close exposure to COVID-19 virus 04/09/2020    PCP: Edman Marsa PARAS, DO  REFERRING PROVIDER: Alvia Selinda PARAS, MD  REFERRING DIAG:  [phone] (ICD-10-CM) - Greater 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  M76.32 (ICD-10-CM) - It band syndrome, left  M25.562 (ICD-10-CM) - Patellofemoral arthralgia of left knee    RATIONALE FOR EVALUATION AND TREATMENT: Rehabilitation  THERAPY DIAG: Pain in left hip  Other abnormalities of gait and mobility  Chronic pain of left knee  Muscle weakness (generalized)  ONSET DATE: Chronic (> 5 Years)  FOLLOW-UP APPT SCHEDULED WITH REFERRING PROVIDER: Yes    SUBJECTIVE:  SUBJECTIVE STATEMENT:    Patient is a 63 year old female presenting to OPPT with a chief concern of Knee and Hip pain.   PERTINENT HISTORY:   Jeanne Velez is a 63 year old female presenting with L hip and knee pain. Patient reports that her pain has been persistent for 5 years and she has previously seen a PT for the pain with moderate relief. She reports going through a weight loss journey. Last year she did a boot camp exercise class and following the course her L knee had some pain. 2 mos ago she was walking on a TM and she noticed her L hip had a  popping sensation. She reports an aching pain throughout the day. She states that the pain will wake her periodically and she has to straighten the leg in order to relieve the symptoms. She reports intermittent numbness/tingling in the L lower leg.  Prior to the recent exacerbation, she was very active and worked with a psychologist, educational 2x/week. She also participated in exercises at the gym 3-4 times a week. She does report if she isn't working she reports a sedentary lifestyle (sitting for about 8-10 hours)   She denies loss of b/b, saddle parasthesia, chills, fevers, nausea.  PAIN:    Pain Intensity: Present: 1-2/10, Best: 0/10, Worst: 5-6/10 Pain location: L Hip and L Knee Pain Quality: intermittent, constant, and aching  Radiating: Yes  Numbness/Tingling: Yes How long can you stand:  History of prior back or hip injury, pain, surgery, or therapy: Yes   PRECAUTIONS: Fall  WEIGHT BEARING RESTRICTIONS: No  FALLS: Has patient fallen in last 6 months? No  Living Environment Lives with: lives with their spouse Lives in: House/apartment Stairs: Yes: Internal: 16 steps; on right going up and External: 5-6 steps; can reach both Has following equipment at home: None  Prior level of function: Independent  Hobbies: Gardening, Outdoor woman, Exercises Classes, Pets, Reading   Patient Goals: I would like to reduce pain, increase strength, ROM and flexibility   OBJECTIVE:  Patient Surveys:  Cognition Patient is oriented to person, place, and time.  Recent memory is intact.  Attention span and concentration are intact.     Gross Musculoskeletal Assessment Tremor: None; Bulk: Normal, no muscle wasting noted; Tone: Normal; No visible step-off along spinal column, no signs of scoliosis, no gross anatomical hip deformities;   GAIT: Distance walked: 20 m  Assistive device utilized: None Level of assistance: Complete Independence Comments:   Posture: Lumbar lordosis: WNL Iliac  crest height: Equal bilaterally Lumbar lateral shift: Negative Leg length: Equal bilaterally;  AROM AROM (Normal range in degrees) AROM   Lumbar   Flexion (65) WNL   Extension (30) WNL   Right lateral flexion (25) WNL  Left lateral flexion (25) WNL  Right rotation (30) WNL  Left rotation (30) WNL      Hip Right Left  Flexion (125) WNL WNL  Extension (15)    Abduction (40)    Adduction (30)    Internal Rotation (45) [facility]  External Rotation (45) WFL WFL      Knee    Flexion (135) WNL WNL  Extension (0) WNL WNL      Ankle    Dorsiflexion (20)    Plantarflexion (50)    Inversion (35)    Eversion (15)    (* = pain; Blank rows = not tested)  LE MMT: MMT (out of 5) Right  Left   Hip flexion 4- 4-  Hip  extension    Hip abduction 4 4-*  Hip adduction    Hip internal rotation 4 4  Hip external rotation 4 4  Knee flexion 4- 4-  Knee extension 4 3+  Ankle dorsiflexion 5 5  Ankle plantarflexion 5 5  Ankle inversion    Ankle eversion    (* = pain; Blank rows = not tested)  Sensation Grossly intact to light touch throughout bilateral LEs as determined by testing dermatomes L2-S2. Proprioception, stereognosis, and hot/cold testing deferred on this date.  Reflexes R/L Knee Jerk (L3/4): 2+/2+    Muscle Length Hamstrings: R: Negative L: Negative  Palpation Location Right Left         Lumbar paraspinals  0  Quadratus Lumborum  0  Iliac Crest  0  ASIS  0  Pubic Rami    Pubic symphysis   Femoral Triangle    Inguinal ligament    Gluteus Maximus  2  Gluteus Medius  2  Deep hip external rotators  2  Sacrum 0  PSIS    Fortin's Area (SIJ)  0  Coccyx   IT BAND   2  Greater Trochanter  2  (Blank rows = not tested) Graded on 0-4 scale (0 = no pain, 1 = pain, 2 = pain with wincing/grimacing/flinching, 3 = pain with withdrawal, 4 = unwilling to allow palpation)  Passive Accessory Intervertebral Motion Deferred  Special Tests Lumbar Radiculopathy and  Discogenic: SLR (SN 92, -LR 0.29): R: Negative L:  Negative Crossed SLR (SP 90): R: Negative L: Negative  Facet Joint: Extension-Rotation (SN 100, -LR 0.0): R: Negative L: Negative  Hip: FABER (SN 81): R: Not Tested L: Negative FADIR (SN 94): R: Not Tested L: Negative  Functional Movement:   Sit To Stand: WNL Stairs: WNL  Squatting: 5 reps, Hip IR/ADD noted in LLE, less weight shift on LLE, narrow Bos. Pt reported moderate weakness in L knee following 5 repetitions   TODAY'S TREATMENT: DATE: 09/19/24  Subjective: Patient still reports 3-4/10 in the L hip. She reports that the pain seems very focal to the Greater Trochanter specifically during sleeping and sidelying position. No question or concerns.  Therapeutic Exercise:  Supine Bridges with resistance around thigh (Blue TB)  2 x 10 - Red TB Chest Press for core and glute  1 x 10 - Heels further away   Side lying Hip Abduction - Straight Leg   L: 3 x 10 - Blue TB around thighs  R Lock Clamshell   2 x 12 1 x 12 - against PT resistance      Supine IT Band Stretch   30s/bout x 2 in order  tissue extensibility  Knee extension   Double LE: 1 x 10 - 15#   Single LE L: 2 x 10 - 5#   Knee Flexion   Double LE: 1 x 10 - 25#   Single LE L: 2 x 10 - 15#   Therapeutic Activity:  NuStep L6-1 x 6 min x UE/LE (Seat 10) for LE endurance and strength for capacity of walking; PT manually adjusted resistance throughout bout. Minor pain in the R patellar tendon with higher resistance but relieved with reduction in resistance.   Kettlebell Squat  1 x 10 - 10#   1 x 10 - 20#  1 x 8 - 30#   Lateral Stepping with Resistance  4 x 12' - Blue TB around ankles   4 x 12' - Blue TB around ankles   4 x  12' - Black TB around ankles       PATIENT EDUCATION:  Education details: Exercise Technique, HEP  Person educated: Patient Education method: Explanation, Demonstration, and Handouts Education comprehension: verbalized  understanding, returned demonstration, and tactile cues required   HOME EXERCISE PROGRAM:   Access Code: [email or web address] URL: [email or web address] Date: 09/12/2024 Prepared by: Lonni Pall  Exercises - Clamshell with Resistance  - 1 x daily - 3-4 x weekly - 2-3 sets - 10-12 reps - Supine Bridge  - 1 x daily - 3-4 x weekly - 2-3 sets - 10-12 reps - Mini Squat with Counter Support  - 1 x daily - 3-4 x weekly - 2-3 sets - 10-12 reps - Supine Gluteus Stretch  - 1 x daily - 7 x weekly - 3 sets - 30-60 hold - Supine Piriformis Stretch  - 1 x daily - 7 x weekly - 3 sets - 30-60 hold   ASSESSMENT:  CLINICAL IMPRESSION: Patient returned to OPPT to OPPT fo rmanagement of left hip pain secondary to 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 She continues to tolerate progression to exercises without exacerbation of pain in the L hip. PT focused on global hip strength with additional focus on anterior hip lengthening. PT applied medial mob with knee ext/flexion for proper patellar alignment.  Patient reported improved hip pain following PT interventions. Patient's pain still continues to limit her from full participation with recreational activities and working out with her training. Based on today's performance, pt will continue to benefit from skilled PT in order to facilitate return to PLOF and improve QoL.  OBJECTIVE IMPAIRMENTS: Abnormal gait, decreased activity tolerance, decreased endurance, difficulty walking, decreased strength, increased muscle spasms, impaired sensation, and pain.   ACTIVITY LIMITATIONS: lifting, bending, standing, squatting, sleeping, stairs, and transfers  PARTICIPATION LIMITATIONS: community activity, occupation, and yard work  PERSONAL FACTORS: Age, Fitness, Past/current experiences, Profession, and Time since onset of injury/illness/exacerbation are also affecting patient's functional outcome.   REHAB POTENTIAL: Good  CLINICAL DECISION MAKING: Evolving/moderate  complexity  EVALUATION COMPLEXITY: Moderate   GOALS: Goals reviewed with patient? No  SHORT TERM GOALS: Target date: 10/24/2024  Pt will be independent with HEP in order to improve strength and decrease hip pain to improve pain-free function at home and work. Baseline: 09/12/2024: Initial HEP provided  Goal status: INITIAL   LONG TERM GOALS: Target date: 12/05/2024  Pt will increase LEFS by at least 9 points in order to demonstrate significant improvement in lower extremity function.  Baseline: 09/12/2024: 64/80 Goal status: INITIAL  2.  Pt will decrease worst hip pain by at least 3 points on the NPRS in order to demonstrate clinically significant reduction in hip pain. Baseline: 09/12/2024: 6/10 Goal status: INITIAL  3.  Pt will be able to perform 10 squats with proper mechanics and minimal pain (< 3 NPS) in order to demonstrate significant improvements in function and strength for hobbies.     Baseline: 09/12/2024: 5 squats, moderate L knee pain (5/10) Goal status: INITIAL  4.  Pt will increase R/L LE strength by at least 1/2 MMT  (I.e. 4 > 4+) grade in order to demonstrate improvement in strength and function for hobbies and walking.  Baseline: 09/12/2024: see above Goal status: INITIAL  5. Pt will achieve 6-8 hours of sleep per night without report of awakening from hip pain in order to demonstrate significant improvements in pain.  Baseline: 09/12/2024: n/a Goal status: INITIAL   PLAN: PT FREQUENCY: 1-2x/week  PT DURATION: 12 weeks  PLANNED INTERVENTIONS: Therapeutic exercises,  Therapeutic activity, Neuromuscular re-education, Balance training, Gait training, Joint mobilization, Electrical stimulation, Cryotherapy, Moist heat, Taping, Manual therapy, and Re-evaluation.  PLAN FOR NEXT SESSION: Review HEP, Initiate hip strengthening, knee strengthening, hip stretches (IT band)    Lonni Pall PT, DPT Physical Therapist- [place]  09/19/2024, 11:19 AM

## 2024-09-25 ENCOUNTER — Ambulatory Visit: Payer: PRIVATE HEALTH INSURANCE

## 2024-09-25 ENCOUNTER — Other Ambulatory Visit: Payer: Self-pay | Admitting: Family Medicine

## 2024-09-25 DIAGNOSIS — M25552 Pain in left hip: Secondary | ICD-10-CM

## 2024-09-25 DIAGNOSIS — R2689 Other abnormalities of gait and mobility: Secondary | ICD-10-CM

## 2024-09-25 DIAGNOSIS — Z1231 Encounter for screening mammogram for malignant neoplasm of breast: Secondary | ICD-10-CM

## 2024-09-25 DIAGNOSIS — M6281 Muscle weakness (generalized): Secondary | ICD-10-CM

## 2024-09-25 DIAGNOSIS — G8929 Other chronic pain: Secondary | ICD-10-CM

## 2024-09-25 NOTE — Therapy (Signed)
 OUTPATIENT PHYSICAL THERAPY HIP/KNEE TREATMENT   Patient Name: Jeanne Velez MRN: [id number] DOB:12/29/1960, 63 y.o., female Today's Date: 09/25/2024  END OF SESSION:  PT End of Session - 09/25/24 1117     Visit Number 4    Number of Visits 25    Date for Recertification  12/05/24    PT Start Time 1116    PT Stop Time 1200    PT Time Calculation (min) 44 min    Activity Tolerance Patient tolerated treatment well    Behavior During Therapy [facility] for tasks assessed/performed           Past Medical History:  Diagnosis Date   Acne    Allergy    Anemia    Back pain    BRCA negative 07/2020   MyRisk neg   Cataracts, bilateral    Colon polyp    Constipation    Family history of breast cancer 07/2020   IBIS=15.1%/riskscore=6.7%   Family history of skin cancer 02/24/2022   Folliculitis    GERD (gastroesophageal reflux disease)    Hyperlipidemia    Hypothyroidism    Joint pain    Macular degeneration    Osteoarthritis    Retinal vasculitis    Stress    Thyroid cancer (HCC)    Past Surgical History:  Procedure Laterality Date   ABDOMINAL HYSTERECTOMY     BREAST BIOPSY Right    yrs ago benign, no marker   COLONOSCOPY WITH PROPOFOL  N/A 12/14/2022   Procedure: COLONOSCOPY WITH PROPOFOL ;  Surgeon: Jeanne Corinn Skiff, MD;  Location: [facility] ENDOSCOPY;  Service: Gastroenterology;  Laterality: N/A;   FLEXIBLE SIGMOIDOSCOPY N/A 04/28/2021   Procedure: FLEXIBLE SIGMOIDOSCOPY;  Surgeon: Jeanne Corinn Skiff, MD;  Location: [facility] ENDOSCOPY;  Service: Gastroenterology;  Laterality: N/A;   ovarial cystectomy     THYROIDECTOMY     TOTAL ABDOMINAL HYSTERECTOMY W/ BILATERAL SALPINGOOPHORECTOMY     dermoids, ovar cysts   TUBAL LIGATION     Patient Active Problem List   Diagnosis Date Noted   Greater 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 08/26/2024   It band syndrome, left 08/26/2024   Patellofemoral arthralgia of left knee 08/26/2024   Polyp of ascending colon 12/14/2022    Rectal polyp 12/14/2022   Genetic testing 04/12/2022   Family history of skin cancer 02/24/2022   Stress 12/14/2021   Pre-diabetes 06/28/2021   Vitamin D  deficiency 05/04/2021   Class 1 obesity with serious comorbidity and body mass index (BMI) of 33.0 to 33.9 in adult 05/04/2021   History of colonic polyps    Vasomotor symptoms due to menopause 07/28/2020   Hypothyroidism 07/24/2020   Mixed hyperlipidemia 07/24/2020   Family history of breast cancer 07/24/2020   Arthritis 07/24/2020   History of thyroid cancer 07/24/2020   Close exposure to COVID-19 virus 04/09/2020    PCP: Jeanne Marsa PARAS, DO  REFERRING PROVIDER: Alvia Selinda PARAS, MD  REFERRING DIAG:  [phone] (ICD-10-CM) - Greater 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  M76.32 (ICD-10-CM) - It band syndrome, left  M25.562 (ICD-10-CM) - Patellofemoral arthralgia of left knee    RATIONALE FOR EVALUATION AND TREATMENT: Rehabilitation  THERAPY DIAG: Pain in left hip  Other abnormalities of gait and mobility  Chronic pain of left knee  Muscle weakness (generalized)  ONSET DATE: Chronic (> 5 Years)  FOLLOW-UP APPT SCHEDULED WITH REFERRING PROVIDER: Yes    SUBJECTIVE:  SUBJECTIVE STATEMENT:    Patient is a 63 year old female presenting to OPPT with a chief concern of Knee and Hip pain.   PERTINENT HISTORY:   Jeanne Velez is a 63 year old female presenting with L hip and knee pain. Patient reports that her pain has been persistent for 5 years and she has previously seen a PT for the pain with moderate relief. She reports going through a weight loss journey. Last year she did a boot camp exercise class and following the course her L knee had some pain. 2 mos ago she was walking on a TM and she noticed her L hip had a  popping sensation. She reports an aching pain throughout the day. She states that the pain will wake her periodically and she has to straighten the leg in order to relieve the symptoms. She reports intermittent numbness/tingling in the L lower leg.  Prior to the recent exacerbation, she was very active and worked with a psychologist, educational 2x/week. She also participated in exercises at the gym 3-4 times a week. She does report if she isn't working she reports a sedentary lifestyle (sitting for about 8-10 hours)   She denies loss of b/b, saddle parasthesia, chills, fevers, nausea.  PAIN:    Pain Intensity: Present: 1-2/10, Best: 0/10, Worst: 5-6/10 Pain location: L Hip and L Knee Pain Quality: intermittent, constant, and aching  Radiating: Yes  Numbness/Tingling: Yes How long can you stand:  History of prior back or hip injury, pain, surgery, or therapy: Yes   PRECAUTIONS: Fall  WEIGHT BEARING RESTRICTIONS: No  FALLS: Has patient fallen in last 6 months? No  Living Environment Lives with: lives with their spouse Lives in: House/apartment Stairs: Yes: Internal: 16 steps; on right going up and External: 5-6 steps; can reach both Has following equipment at home: None  Prior level of function: Independent  Hobbies: Gardening, Outdoor woman, Exercises Classes, Pets, Reading   Patient Goals: I would like to reduce pain, increase strength, ROM and flexibility   OBJECTIVE:  Patient Surveys:  Cognition Patient is oriented to person, place, and time.  Recent memory is intact.  Attention span and concentration are intact.     Gross Musculoskeletal Assessment Tremor: None; Bulk: Normal, no muscle wasting noted; Tone: Normal; No visible step-off along spinal column, no signs of scoliosis, no gross anatomical hip deformities;   GAIT: Distance walked: 20 m  Assistive device utilized: None Level of assistance: Complete Independence Comments:   Posture: Lumbar lordosis: WNL Iliac  crest height: Equal bilaterally Lumbar lateral shift: Negative Leg length: Equal bilaterally;  AROM AROM (Normal range in degrees) AROM   Lumbar   Flexion (65) WNL   Extension (30) WNL   Right lateral flexion (25) WNL  Left lateral flexion (25) WNL  Right rotation (30) WNL  Left rotation (30) WNL      Hip Right Left  Flexion (125) WNL WNL  Extension (15)    Abduction (40)    Adduction (30)    Internal Rotation (45) [facility]  External Rotation (45) WFL WFL      Knee    Flexion (135) WNL WNL  Extension (0) WNL WNL      Ankle    Dorsiflexion (20)    Plantarflexion (50)    Inversion (35)    Eversion (15)    (* = pain; Blank rows = not tested)  LE MMT: MMT (out of 5) Right  Left   Hip flexion 4- 4-  Hip  extension    Hip abduction 4 4-*  Hip adduction    Hip internal rotation 4 4  Hip external rotation 4 4  Knee flexion 4- 4-  Knee extension 4 3+  Ankle dorsiflexion 5 5  Ankle plantarflexion 5 5  Ankle inversion    Ankle eversion    (* = pain; Blank rows = not tested)  Sensation Grossly intact to light touch throughout bilateral LEs as determined by testing dermatomes L2-S2. Proprioception, stereognosis, and hot/cold testing deferred on this date.  Reflexes R/L Knee Jerk (L3/4): 2+/2+    Muscle Length Hamstrings: R: Negative L: Negative  Palpation Location Right Left         Lumbar paraspinals  0  Quadratus Lumborum  0  Iliac Crest  0  ASIS  0  Pubic Rami    Pubic symphysis   Femoral Triangle    Inguinal ligament    Gluteus Maximus  2  Gluteus Medius  2  Deep hip external rotators  2  Sacrum 0  PSIS    Fortin's Area (SIJ)  0  Coccyx   IT BAND   2  Greater Trochanter  2  (Blank rows = not tested) Graded on 0-4 scale (0 = no pain, 1 = pain, 2 = pain with wincing/grimacing/flinching, 3 = pain with withdrawal, 4 = unwilling to allow palpation)  Passive Accessory Intervertebral Motion Deferred  Special Tests Lumbar Radiculopathy and  Discogenic: SLR (SN 92, -LR 0.29): R: Negative L:  Negative Crossed SLR (SP 90): R: Negative L: Negative  Facet Joint: Extension-Rotation (SN 100, -LR 0.0): R: Negative L: Negative  Hip: FABER (SN 81): R: Not Tested L: Negative FADIR (SN 94): R: Not Tested L: Negative  Functional Movement:   Sit To Stand: WNL Stairs: WNL  Squatting: 5 reps, Hip IR/ADD noted in LLE, less weight shift on LLE, narrow Bos. Pt reported moderate weakness in L knee following 5 repetitions   TODAY'S TREATMENT: DATE: 09/25/24  Subjective: Patient still reports 0/10 in the L hip. Patient reports that she has less pain at night. Moderate soreness following last PT session but she was able to do her workout session. No question or concerns.  Therapeutic Exercise:  Supine Bridges - Legs extended - On heels for hamstring strengthening   3 x 10 - PT resistance at knees  Side lying Hip Abduction - Straight Leg   L: 3 x 10 - Blue TB around thighs   - Increased hip ext. For reduced anterior compensation.   R Clamshell  2 x 10 - Blue TB around knees    Knee extension   Double LE: 1 x 10 - 15#, 2 x 10 20#   Knee Flexion   Double LE: 1 x 10 - 25#, 2 x 10 #    Therapeutic Activity:  NuStep L6-1 x 6 min x LE only (Seat 10) for LE endurance and strength for capacity of walking; PT manually adjusted resistance throughout bout. Minor pain in the R patellar tendon with higher resistance but relieved with reduction in resistance.   Kettlebell Squat  2 x 10 - 20#  1 x 10 - 30#   Lateral Stepping with Resistance  4 x 12' - Black TB around ankles   4 x 12' - Black TB around ankles  4 x 12' - Black TB around ankles   PATIENT EDUCATION:  Education details: Exercise Technique, HEP  Person educated: Patient Education method: Explanation, Demonstration, and Handouts Education comprehension: verbalized understanding, returned  demonstration, and tactile cues required   HOME EXERCISE PROGRAM:  Access Code:  [email or web address] URL: [email or web address] Date: 09/25/2024 Prepared by: Lonni Pall  Exercises - Clamshell with Resistance  - 1 x daily - 3-4 x weekly - 2-3 sets - 10-12 reps - Supine Bridge with Resistance Band  - 1 x daily - 3-4 x weekly - 2-3 sets - 10-12 reps - Mini Squat with Counter Support  - 1 x daily - 3-4 x weekly - 2-3 sets - 10-12 reps - Supine Gluteus Stretch  - 1 x daily - 7 x weekly - 3 sets - 30-60 hold - Supine Piriformis Stretch  - 1 x daily - 7 x weekly - 3 sets - 30-60 hold - Supine ITB Stretch with Strap  - 1 x daily - 7 x weekly - 2-3 sets - 30s hold - Supine Quadriceps Stretch with Strap on Table  - 1 x daily - 7 x weekly - 2-3 sets - 30s hold  Access Code: [email or web address] URL: [email or web address] Date: 09/12/2024 Prepared by: Lonni Pall  Exercises - Clamshell with Resistance  - 1 x daily - 3-4 x weekly - 2-3 sets - 10-12 reps - Supine Bridge  - 1 x daily - 3-4 x weekly - 2-3 sets - 10-12 reps - Mini Squat with Counter Support  - 1 x daily - 3-4 x weekly - 2-3 sets - 10-12 reps - Supine Gluteus Stretch  - 1 x daily - 7 x weekly - 3 sets - 30-60 hold - Supine Piriformis Stretch  - 1 x daily - 7 x weekly - 3 sets - 30-60 hold   ASSESSMENT:  CLINICAL IMPRESSION: Continued PT POC in management of L hip pain. Patient continues to steadily improve with hip pain following PT interventions. Good tolerance to all exercises today without exacerbation to pain. PT will continue to strengthening L hip musculature in order to reduce further injury. Plan to assess anterior hip length Cathy Test) and begin lateral lunges. Patient with a two week hiatus from PT due to going to a conference. Encouraged continued adherence to HEP to maintain progress. Based on today's performance, pt will continue to benefit from skilled PT in order to facilitate return to PLOF and improve QoL.  OBJECTIVE IMPAIRMENTS: Abnormal gait, decreased activity tolerance, decreased  endurance, difficulty walking, decreased strength, increased muscle spasms, impaired sensation, and pain.   ACTIVITY LIMITATIONS: lifting, bending, standing, squatting, sleeping, stairs, and transfers  PARTICIPATION LIMITATIONS: community activity, occupation, and yard work  PERSONAL FACTORS: Age, Fitness, Past/current experiences, Profession, and Time since onset of injury/illness/exacerbation are also affecting patient's functional outcome.   REHAB POTENTIAL: Good  CLINICAL DECISION MAKING: Evolving/moderate complexity  EVALUATION COMPLEXITY: Moderate   GOALS: Goals reviewed with patient? No  SHORT TERM GOALS: Target date: 10/24/2024  Pt will be independent with HEP in order to improve strength and decrease hip pain to improve pain-free function at home and work. Baseline: 09/12/2024: Initial HEP provided  Goal status: INITIAL   LONG TERM GOALS: Target date: 12/05/2024  Pt will increase LEFS by at least 9 points in order to demonstrate significant improvement in lower extremity function.  Baseline: 09/12/2024: 64/80 Goal status: INITIAL  2.  Pt will decrease worst hip pain by at least 3 points on the NPRS in order to demonstrate clinically significant reduction in hip pain. Baseline: 09/12/2024: 6/10 Goal status: INITIAL  3.  Pt will be able to perform 10 squats with proper mechanics and minimal pain (< 3 NPS) in order  to demonstrate significant improvements in function and strength for hobbies.     Baseline: 09/12/2024: 5 squats, moderate L knee pain (5/10) Goal status: INITIAL  4.  Pt will increase R/L LE strength by at least 1/2 MMT  (I.e. 4 > 4+) grade in order to demonstrate improvement in strength and function for hobbies and walking.  Baseline: 09/12/2024: see above Goal status: INITIAL  5. Pt will achieve 6-8 hours of sleep per night without report of awakening from hip pain in order to demonstrate significant improvements in pain.  Baseline: 09/12/2024:  n/a Goal status: INITIAL   PLAN: PT FREQUENCY: 1-2x/week  PT DURATION: 12 weeks  PLANNED INTERVENTIONS: Therapeutic exercises, Therapeutic activity, Neuromuscular re-education, Balance training, Gait training, Joint mobilization, Electrical stimulation, Cryotherapy, Moist heat, Taping, Manual therapy, and Re-evaluation.  PLAN FOR NEXT SESSION: Review HEP, Initiate hip strengthening, knee strengthening, hip stretches (IT band)    Lonni Pall PT, DPT Physical Therapist- [place]  09/25/2024, 11:18 AM

## 2024-10-01 ENCOUNTER — Ambulatory Visit: Payer: PRIVATE HEALTH INSURANCE

## 2024-10-02 ENCOUNTER — Ambulatory Visit: Payer: PRIVATE HEALTH INSURANCE

## 2024-10-07 ENCOUNTER — Ambulatory Visit: Payer: PRIVATE HEALTH INSURANCE

## 2024-10-07 ENCOUNTER — Encounter: Payer: Self-pay | Admitting: Family Medicine

## 2024-10-07 ENCOUNTER — Ambulatory Visit: Payer: PRIVATE HEALTH INSURANCE | Admitting: Family Medicine

## 2024-10-07 VITALS — BP 108/60 | HR 103 | Ht 68.0 in | Wt 179.0 lb

## 2024-10-07 DIAGNOSIS — M25552 Pain in left hip: Secondary | ICD-10-CM

## 2024-10-07 DIAGNOSIS — M7632 Iliotibial band syndrome, left leg: Secondary | ICD-10-CM

## 2024-10-07 DIAGNOSIS — M25562 Pain in left knee: Secondary | ICD-10-CM

## 2024-10-08 NOTE — Progress Notes (Signed)
 History of present illness Jeanne Velez is seen today for follow-up hypothyroidism and thyroid cancer.  She is a 63 y.o. female who was diagnosed with thyroid nodular disease in 2002.  She underwent left hemithyroidectomy which showed a benign lesion.  She developed a nodule on the right that was biopsied and suspicious.  She had a completion thyroidectomy in 2009 which showed malignancy.  She was then treated with 135 mCi of radioiodine.  There has been no evidence of recurrence since.  She has been treated with LT4 therapy.  I last saw her in 11/24. In 1/25, I adjusted her LT4 dose. In 6/25, she messaged that PCP did labs and wants to reduce T4 due to T4 being above nl.   She is currently on LT4 125 mcg daily.  She feels well overall.  She has no anterior neck pain/swelling.  She is concerned about her thyroid.    She is on Zepbound 5 mg weekly (started in 4/24 and weighed 226 at that time).  She is down 49 since she started it (she is down 9 pounds since last visit).  She has lost 22% of her starting weight.  She watches her diet.  She exercises regularly (does cardio daily and does strength training with trainer biw).  She is concerned about her weight.   ROS:  No chest pain.  No SOB.   Medical History: Past Medical History:  Diagnosis Date  . Macular degeneration   . Post-operative hypothyroidism   . Thyroid cancer (CMS/HHS-HCC)     Surgical History: Past Surgical History:  Procedure Laterality Date  . REMOVAL OVARIAN CYST Right 1987  . LAPAROSCOPIC TUBAL LIGATION Bilateral 1998  . HYSTERECTOMY VAGINAL  2009  . THYROID SURGERY     hemithyroidectomy 2002, completion 2009    Social History:  reports that she has never smoked. She has never used smokeless tobacco. She reports current alcohol use. She reports that she does not use drugs. She is married.  She runs a scientist, water quality for recreational therapists.   Family History: family history includes  Bone cancer in her maternal aunt; Breast cancer in her maternal grandmother; Diabetes in her maternal grandfather; Heart disease in her father; Melanoma in her maternal aunt.  Medications: Current Outpatient Medications  Medication Sig Dispense Refill  . acetaminophen (TYLENOL) 325 MG tablet Take by mouth    . calcium carbonate-vitamin D3 (OS-CAL 500+D) 500 mg(1,250mg ) -200 unit tablet Take by mouth    . clindamycin  (CLINDAGEL) 1 % topical gel     . DOXYCYCLINE  HYCLATE ORAL Take 40 mg by mouth once daily    . estradioL  (ESTRACE ) 0.5 MG tablet Take by mouth Three times weekly    . fluticasone  propionate (FLONASE ) 50 mcg/actuation nasal spray Place 2 sprays into both nostrils once daily    . levothyroxine (SYNTHROID) 125 MCG tablet Take 1 tablet (125 mcg total) by mouth once daily 90 tablet 4  . loratadine (CLARITIN) 10 mg tablet Take by mouth    . tirzepatide (ZEPBOUND) 5 mg/0.5 mL pen injector Inject 0.5 mLs (5 mg total) subcutaneously once a week 2 mL 12  . valACYclovir  (VALTREX ) 1000 MG tablet Take by mouth once daily as needed    . vitamins  A,C,E-zinc-copper (PRESERVISION AREDS) [phone] unit-mg-unit Take by mouth    . Lactobacillus acidophilus (PROBIOTIC) 10 billion cell Cap Take by mouth (Patient not taking: Reported on 10/08/2024)     No current facility-administered medications for this visit.    Allergies:  Allergies  Allergen Reactions  . Penicillins Swelling    Physical Exam: Vitals:   10/08/24 0821  BP: 110/70  Pulse: 67  SpO2: 99%  Weight: 80.3 kg (177 lb)    Body mass index is 26.91 kg/m. GENERAL: Pleasant, well-appearing female in no distress.  NECK: Thyroid not enlarged, no nodules palpable.   Physical exam otherwise deferred due to coronavirus precautions.  Labs: 2009: Pathology shows multifocal tumor w/ a 1.6 cm and 1.2 cm focus.  margina negative.  No extension.   08/28/2018: neck u/s with no evidence of recurrent residual disease.   07/28/2020:  TSH = 0.88, TT4 = 10.7.  T3U > 56.  FTI > 6.  K/Cr/Ca = 4.3/0.81/9.3.  LFTs nl.   08/11/2020: TSH = 0.848.  FT4 = 1.66 (0.8-1.77).  A1c = 5.6.  K/Cr/Ca = 4.4/0.7/9.8.  LFTs nl.  11/25/2020: A1c = 5.6.  TSH = 2.398.  Free T4 = 0.97.  Thyroglobulin <0.1.   03/03/2021: TSH = 0.887 07/22/2021:  A1c = 5.7.  K/Cr/Ca = 4.4/0.74/9.2.  D = 57.1. 09/03/2021: A1c = 5.7.  TSH = 1.597.  Thyroglobulin <0.1  12/13/2021:  A1c = 5.6.  D = 53.8.  03/15/2022:  TSH=2.504. 09/14/2022:  TSH=0.821.  Tg<0.1.   04/26/2023:  A1c = 5.3 08/30/2023:  TSH=0.511.  Tg=0.2.   12/07/2023:  TSH=1.923.  Tg<0.1.  05/01/2024:  TSH=0.4 (0.4-4.5).  FT4=2.0 (0.8-1.8).  A1c=5.2.  K/Cr/Ca= 4.9/0.89/9.8.  Chol=159/114/51/87.  LFTs nl. 09/23/2024:  TSH=0.567.  Tg<0.1.  Assessment/Plan: 1.  Thyroid cancer.  She was diagnosed with thyroid nodular disease in 2002 and had a left hemithyroidectomy which showed a benign lesion.  She developed a nodule on the right that was biopsied and suspicious, so she had a completion thyroidectomy in 2009 which showed malignancy.  She was treated with 135 mCi of radioiodine and there has been no evidence of recurrence since.  U/s from 2019 showed no evidence of recurrent residual disease.  Tg was <0.1 in 11/25. I will recheck ahead of next years visit.  2.  Post-op hypothyroidism.  Her LT4 dose has been adjusted several times in the past. Her TSH in 11/25 was nl on LT4 125 mcg daily. I will continue her on the same dose.   3.  Prediabetes.  Her A1c in 6/25 was 5.2.  I encouraged lifestyle modifications.  Consider the addition of MTF if her A1c ever goes up over time.   4.  Obesity.  She weighs 177 today with a BMI of 26.91.  She is down 49 pounds since and has lost 22%% of her starting weight since starting Zepbound (was 226 in 4/24 when she started it).  She did not tolerate phentermine.  She previously saw a weight loss clinic.  I encouraged continued lifestyle modifications. I sent in years worth of refills.    5.  RTC in 12 months. She will have labs ahead of next visit.   This note is partially prepared by Earla Daria Messier, Scribe, in the presence of and acting as the scribe of Dr. Debby Breaker , MD.    [facility], MD

## 2024-10-09 ENCOUNTER — Ambulatory Visit: Payer: PRIVATE HEALTH INSURANCE

## 2024-10-09 DIAGNOSIS — L700 Acne vulgaris: Secondary | ICD-10-CM | POA: Diagnosis not present

## 2024-10-09 DIAGNOSIS — M25552 Pain in left hip: Secondary | ICD-10-CM | POA: Diagnosis not present

## 2024-10-09 DIAGNOSIS — R2689 Other abnormalities of gait and mobility: Secondary | ICD-10-CM

## 2024-10-09 DIAGNOSIS — M6281 Muscle weakness (generalized): Secondary | ICD-10-CM

## 2024-10-09 DIAGNOSIS — G8929 Other chronic pain: Secondary | ICD-10-CM

## 2024-10-09 NOTE — Therapy (Signed)
 OUTPATIENT PHYSICAL THERAPY HIP/KNEE TREATMENT   Patient Name: Jeanne Velez MRN: [id number] DOB:02/11/1961, 63 y.o., female Today's Date: 10/09/2024  END OF SESSION:  PT End of Session - 10/09/24 1117     Visit Number 5    Number of Visits 25    Date for Recertification  12/05/24    PT Start Time 1118    PT Stop Time 1200    PT Time Calculation (min) 42 min    Activity Tolerance Patient tolerated treatment well    Behavior During Therapy [facility] for tasks assessed/performed           Past Medical History:  Diagnosis Date   Acne    Allergy    Anemia    Back pain    BRCA negative 07/2020   MyRisk neg   Cataracts, bilateral    Colon polyp    Constipation    Family history of breast cancer 07/2020   IBIS=15.1%/riskscore=6.7%   Family history of skin cancer 02/24/2022   Folliculitis    GERD (gastroesophageal reflux disease)    Hyperlipidemia    Hypothyroidism    Joint pain    Macular degeneration    Osteoarthritis    Retinal vasculitis    Stress    Thyroid cancer (HCC)    Past Surgical History:  Procedure Laterality Date   ABDOMINAL HYSTERECTOMY     BREAST BIOPSY Right    yrs ago benign, no marker   COLONOSCOPY WITH PROPOFOL  N/A 12/14/2022   Procedure: COLONOSCOPY WITH PROPOFOL ;  Surgeon: Unk Corinn Skiff, MD;  Location: [facility] ENDOSCOPY;  Service: Gastroenterology;  Laterality: N/A;   FLEXIBLE SIGMOIDOSCOPY N/A 04/28/2021   Procedure: FLEXIBLE SIGMOIDOSCOPY;  Surgeon: Unk Corinn Skiff, MD;  Location: [facility] ENDOSCOPY;  Service: Gastroenterology;  Laterality: N/A;   ovarial cystectomy     THYROIDECTOMY     TOTAL ABDOMINAL HYSTERECTOMY W/ BILATERAL SALPINGOOPHORECTOMY     dermoids, ovar cysts   TUBAL LIGATION     Patient Active Problem List   Diagnosis Date Noted   Greater 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 08/26/2024   It band syndrome, left 08/26/2024   Patellofemoral arthralgia of left knee 08/26/2024   Polyp of ascending colon 12/14/2022    Rectal polyp 12/14/2022   Genetic testing 04/12/2022   Family history of skin cancer 02/24/2022   Stress 12/14/2021   Pre-diabetes 06/28/2021   Vitamin D  deficiency 05/04/2021   Class 1 obesity with serious comorbidity and body mass index (BMI) of 33.0 to 33.9 in adult 05/04/2021   History of colonic polyps    Vasomotor symptoms due to menopause 07/28/2020   Hypothyroidism 07/24/2020   Mixed hyperlipidemia 07/24/2020   Family history of breast cancer 07/24/2020   Arthritis 07/24/2020   History of thyroid cancer 07/24/2020   Close exposure to COVID-19 virus 04/09/2020    PCP: Edman Marsa PARAS, DO  REFERRING PROVIDER: Alvia Selinda PARAS, MD  REFERRING DIAG:  [phone] (ICD-10-CM) - Greater 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  M76.32 (ICD-10-CM) - It band syndrome, left  M25.562 (ICD-10-CM) - Patellofemoral arthralgia of left knee    RATIONALE FOR EVALUATION AND TREATMENT: Rehabilitation  THERAPY DIAG: Pain in left hip  Other abnormalities of gait and mobility  Chronic pain of left knee  Muscle weakness (generalized)  ONSET DATE: Chronic (> 5 Years)  FOLLOW-UP APPT SCHEDULED WITH REFERRING PROVIDER: Yes    SUBJECTIVE:  SUBJECTIVE STATEMENT:    Patient is a 63 year old female presenting to OPPT with a chief concern of Knee and Hip pain.   PERTINENT HISTORY:   Jeanne Velez is a 63 year old female presenting with L hip and knee pain. Patient reports that her pain has been persistent for 5 years and she has previously seen a PT for the pain with moderate relief. She reports going through a weight loss journey. Last year she did a boot camp exercise class and following the course her L knee had some pain. 2 mos ago she was walking on a TM and she noticed her L hip had a  popping sensation. She reports an aching pain throughout the day. She states that the pain will wake her periodically and she has to straighten the leg in order to relieve the symptoms. She reports intermittent numbness/tingling in the L lower leg.  Prior to the recent exacerbation, she was very active and worked with a psychologist, educational 2x/week. She also participated in exercises at the gym 3-4 times a week. She does report if she isn't working she reports a sedentary lifestyle (sitting for about 8-10 hours)   She denies loss of b/b, saddle parasthesia, chills, fevers, nausea.  PAIN:    Pain Intensity: Present: 1-2/10, Best: 0/10, Worst: 5-6/10 Pain location: L Hip and L Knee Pain Quality: intermittent, constant, and aching  Radiating: Yes  Numbness/Tingling: Yes How long can you stand:  History of prior back or hip injury, pain, surgery, or therapy: Yes   PRECAUTIONS: Fall  WEIGHT BEARING RESTRICTIONS: No  FALLS: Has patient fallen in last 6 months? No  Living Environment Lives with: lives with their spouse Lives in: House/apartment Stairs: Yes: Internal: 16 steps; on right going up and External: 5-6 steps; can reach both Has following equipment at home: None  Prior level of function: Independent  Hobbies: Gardening, Outdoor woman, Exercises Classes, Pets, Reading   Patient Goals: I would like to reduce pain, increase strength, ROM and flexibility   OBJECTIVE:  Patient Surveys:  Cognition Patient is oriented to person, place, and time.  Recent memory is intact.  Attention span and concentration are intact.     Gross Musculoskeletal Assessment Tremor: None; Bulk: Normal, no muscle wasting noted; Tone: Normal; No visible step-off along spinal column, no signs of scoliosis, no gross anatomical hip deformities;   GAIT: Distance walked: 20 m  Assistive device utilized: None Level of assistance: Complete Independence Comments:   Posture: Lumbar lordosis: WNL Iliac  crest height: Equal bilaterally Lumbar lateral shift: Negative Leg length: Equal bilaterally;  AROM AROM (Normal range in degrees) AROM   Lumbar   Flexion (65) WNL   Extension (30) WNL   Right lateral flexion (25) WNL  Left lateral flexion (25) WNL  Right rotation (30) WNL  Left rotation (30) WNL      Hip Right Left  Flexion (125) WNL WNL  Extension (15)    Abduction (40)    Adduction (30)    Internal Rotation (45) [facility]  External Rotation (45) WFL WFL      Knee    Flexion (135) WNL WNL  Extension (0) WNL WNL      Ankle    Dorsiflexion (20)    Plantarflexion (50)    Inversion (35)    Eversion (15)    (* = pain; Blank rows = not tested)  LE MMT: MMT (out of 5) Right  Left   Hip flexion 4- 4-  Hip  extension    Hip abduction 4 4-*  Hip adduction    Hip internal rotation 4 4  Hip external rotation 4 4  Knee flexion 4- 4-  Knee extension 4 3+  Ankle dorsiflexion 5 5  Ankle plantarflexion 5 5  Ankle inversion    Ankle eversion    (* = pain; Blank rows = not tested)  Sensation Grossly intact to light touch throughout bilateral LEs as determined by testing dermatomes L2-S2. Proprioception, stereognosis, and hot/cold testing deferred on this date.  Reflexes R/L Knee Jerk (L3/4): 2+/2+    Muscle Length Hamstrings: R: Negative L: Negative  Palpation Location Right Left         Lumbar paraspinals  0  Quadratus Lumborum  0  Iliac Crest  0  ASIS  0  Pubic Rami    Pubic symphysis   Femoral Triangle    Inguinal ligament    Gluteus Maximus  2  Gluteus Medius  2  Deep hip external rotators  2  Sacrum 0  PSIS    Fortin's Area (SIJ)  0  Coccyx   IT BAND   2  Greater Trochanter  2  (Blank rows = not tested) Graded on 0-4 scale (0 = no pain, 1 = pain, 2 = pain with wincing/grimacing/flinching, 3 = pain with withdrawal, 4 = unwilling to allow palpation)  Passive Accessory Intervertebral Motion Deferred  Special Tests Lumbar Radiculopathy and  Discogenic: SLR (SN 92, -LR 0.29): R: Negative L:  Negative Crossed SLR (SP 90): R: Negative L: Negative  Facet Joint: Extension-Rotation (SN 100, -LR 0.0): R: Negative L: Negative  Hip: FABER (SN 81): R: Not Tested L: Negative FADIR (SN 94): R: Not Tested L: Negative  Functional Movement:   Sit To Stand: WNL Stairs: WNL  Squatting: 5 reps, Hip IR/ADD noted in LLE, less weight shift on LLE, narrow Bos. Pt reported moderate weakness in L knee following 5 repetitions   TODAY'S TREATMENT: DATE: 10/09/24  Subjective: Patient reports to OPPT following a conference week. She reports that she had persistent pain with prolonged sitting. She took ibuprofen and some light walking mitigate the pain. No question or concerns.  Therapeutic Exercise (with intent to increase hip flexor/abductor/extensor strength):  Long Sitting   Hip Flexion Up and Over    R: 3 x 10   Supine Bridges - Legs extended - On heels - on Foam roller   3 x 10   Side lying Hip Abduction - Straight Leg   L: 2 x 10 - AROM  L: 2 x 10 - Blue TB around thighs    Prone Hip Extension   L: 3 x 10 - 5# AW   Prone Quadriceps Stretch with Strap   L: 1 min in order to improve tissue extensibility in rectus femoris     Therapeutic Activity:  NuStep L6-1 x 6 min x LE only (Seat 10) for LE endurance and strength for capacity of walking; PT manually adjusted resistance throughout per patient's tolerance.   Kettlebell Squat  3 x 10 - 30#   Lateral Stepping with Resistance  2 x 14' - Grey TB around ankles   2 x 14' -   2 x 14' -    PATIENT EDUCATION:  Education details: Exercise Technique, HEP  Person educated: Patient Education method: Explanation, Demonstration, and Handouts Education comprehension: verbalized understanding, returned demonstration, and tactile cues required   HOME EXERCISE PROGRAM:  Access Code: [email or web address] URL: [email or web address] Date: 09/25/2024 Prepared by: Lonni  Adeoluwa Silvers  Exercises - Clamshell with Resistance  - 1 x daily - 3-4 x weekly - 2-3 sets - 10-12 reps - Supine Bridge with Resistance Band  - 1 x daily - 3-4 x weekly - 2-3 sets - 10-12 reps - Mini Squat with Counter Support  - 1 x daily - 3-4 x weekly - 2-3 sets - 10-12 reps - Supine Gluteus Stretch  - 1 x daily - 7 x weekly - 3 sets - 30-60 hold - Supine Piriformis Stretch  - 1 x daily - 7 x weekly - 3 sets - 30-60 hold - Supine ITB Stretch with Strap  - 1 x daily - 7 x weekly - 2-3 sets - 30s hold - Supine Quadriceps Stretch with Strap on Table  - 1 x daily - 7 x weekly - 2-3 sets - 30s hold  Access Code: [email or web address] URL: [email or web address] Date: 09/12/2024 Prepared by: Lonni Pall  Exercises - Clamshell with Resistance  - 1 x daily - 3-4 x weekly - 2-3 sets - 10-12 reps - Supine Bridge  - 1 x daily - 3-4 x weekly - 2-3 sets - 10-12 reps - Mini Squat with Counter Support  - 1 x daily - 3-4 x weekly - 2-3 sets - 10-12 reps - Supine Gluteus Stretch  - 1 x daily - 7 x weekly - 3 sets - 30-60 hold - Supine Piriformis Stretch  - 1 x daily - 7 x weekly - 3 sets - 30-60 hold   ASSESSMENT:  CLINICAL IMPRESSION: Continued PT POC in management of L hip pain. Patient continues to steadily improve with hip pain following PT interventions. Good tolerance to all exercises today without exacerbation to pain. Negative thomas, however tightness noted in the rectus femoris with concordant pain to palpation. Reviewed Prone quad stretch with strap. Patient still presenting with intermittent L hip pain limiting full participation with recreational activities and prolonged sitting/standing. Based on today's performance, pt will continue to benefit from skilled PT in order to facilitate return to PLOF and improve QoL.  OBJECTIVE IMPAIRMENTS: Abnormal gait, decreased activity tolerance, decreased endurance, difficulty walking, decreased strength, increased muscle spasms, impaired sensation, and  pain.   ACTIVITY LIMITATIONS: lifting, bending, standing, squatting, sleeping, stairs, and transfers  PARTICIPATION LIMITATIONS: community activity, occupation, and yard work  PERSONAL FACTORS: Age, Fitness, Past/current experiences, Profession, and Time since onset of injury/illness/exacerbation are also affecting patient's functional outcome.   REHAB POTENTIAL: Good  CLINICAL DECISION MAKING: Evolving/moderate complexity  EVALUATION COMPLEXITY: Moderate   GOALS: Goals reviewed with patient? No  SHORT TERM GOALS: Target date: 10/24/2024  Pt will be independent with HEP in order to improve strength and decrease hip pain to improve pain-free function at home and work. Baseline: 09/12/2024: Initial HEP provided  Goal status: INITIAL   LONG TERM GOALS: Target date: 12/05/2024  Pt will increase LEFS by at least 9 points in order to demonstrate significant improvement in lower extremity function.  Baseline: 09/12/2024: 64/80 Goal status: INITIAL  2.  Pt will decrease worst hip pain by at least 3 points on the NPRS in order to demonstrate clinically significant reduction in hip pain. Baseline: 09/12/2024: 6/10 Goal status: INITIAL  3.  Pt will be able to perform 10 squats with proper mechanics and minimal pain (< 3 NPS) in order to demonstrate significant improvements in function and strength for hobbies.     Baseline: 09/12/2024: 5 squats, moderate L knee pain (5/10) Goal status: INITIAL  4.  Pt will increase R/L  LE strength by at least 1/2 MMT  (I.e. 4 > 4+) grade in order to demonstrate improvement in strength and function for hobbies and walking.  Baseline: 09/12/2024: see above Goal status: INITIAL  5. Pt will achieve 6-8 hours of sleep per night without report of awakening from hip pain in order to demonstrate significant improvements in pain.  Baseline: 09/12/2024: n/a; 10/09/2024: Pt able to rest at least 7 hrs.  Goal status: INITIAL   PLAN: PT FREQUENCY:  1-2x/week  PT DURATION: 12 weeks  PLANNED INTERVENTIONS: Therapeutic exercises, Therapeutic activity, Neuromuscular re-education, Balance training, Gait training, Joint mobilization, Electrical stimulation, Cryotherapy, Moist heat, Taping, Manual therapy, and Re-evaluation.  PLAN FOR NEXT SESSION: Review HEP, Initiate hip strengthening, knee strengthening, hip stretches (IT band)    Lonni Pall PT, DPT Physical Therapist- [place]  10/09/2024, 11:18 AM

## 2024-10-09 NOTE — Patient Instructions (Signed)
 VISIT SUMMARY:  Today, we discussed your ongoing left hip and knee pain, which has shown some improvement with physical therapy and ibuprofen. We also addressed new right knee pain likely due to compensatory movements. Your allergy to naproxen was noted, and we reviewed alternative pain management options.  YOUR PLAN:  LEFT GREATER TROCHANTERIC BURSITIS WITH ASSOCIATED ILIOTIBIAL BAND SYNDROME AND LEFT KNEE PAIN: You have chronic bursitis with IT band syndrome and knee pain. Your symptoms have improved with physical therapy and exercises, and ibuprofen has been effective for pain relief. -Continue physical therapy and home exercises. -Use ibuprofen as needed for pain. -Consider using Voltaren gel up to four times daily. -If pain persists, consider a cortisone injection. -Monitor your symptoms and adjust treatment as needed.  RIGHT KNEE PAIN: You have new right knee pain likely due to compensatory movements from your left side issues. -Monitor your right knee pain and adjust physical therapy as needed.  ALLERGY TO NAPROXEN: Your allergy to naproxen limits your options for NSAIDs. -Avoid naproxen and related NSAIDs.

## 2024-10-09 NOTE — Progress Notes (Signed)
 Patient here today for a TheraClear session for acne vulgaris.    Treatment # 11 Treatment Area: Face and Neck Energy: 4 Vacuum: 2 Pulse: 750 Pulse Delay: double   Photos taken and placed in media.  Patient understands and agrees she has not taken any forms of Isotretinoin  within the last 4-6 months, she has not taken any Doxycycline  or used topical Retin-A .      Alan Pizza, RMA

## 2024-10-09 NOTE — Progress Notes (Signed)
 Primary Care / Sports Medicine Office Visit  Patient Information:  Patient ID: Jeanne Velez, female DOB: 1961/05/16 Age: 63 y.o. MRN: [id number]   Jeanne Velez is a pleasant 63 y.o. female presenting with the following:  Chief Complaint  Patient presents with   Hip Pain    Left hip has improved a little bit. She has been attending  PT and home exercise stretching daily which are helping. She does still have some aches occasionally when overdoing it. Overall she thinks she is moving I the right direction.    Vitals:   10/07/24 0830  BP: 108/60  Pulse: (!) 103  SpO2: 99%   Vitals:   10/07/24 0830  Weight: 179 lb (81.2 kg)  Height: 5' 8 (1.727 m)   Body mass index is 27.22 kg/m.  No results found.   Discussed the use of AI scribe software for clinical note transcription with the patient, who gave verbal consent to proceed.   Independent interpretation of notes and tests performed by another provider:   None  Procedures performed:   None  Pertinent History, Exam, Impression, and Recommendations:  History of Present Illness Jeanne Velez is a 63 year old female who presents for follow-up of greater trochanteric bursitis and IT band syndrome.  Left hip and knee pain - Ongoing pain localized to the left hip and knee, initially diagnosed as greater trochanteric bursitis and IT band syndrome - Pain is most pronounced when transitioning from sitting to standing and when rising from the floor - Prolonged sitting, such as during a recent conference trip, exacerbates hip pain - Walking on concrete during the conference also worsened symptoms - Estimated 40-50% improvement in pain compared to initial visit - Ibuprofen 400 mg as needed, typically every six hours, provides effective pain relief, especially after activity or prolonged sitting - Celebrex  was discontinued after one dose due to side effects of feeling 'loopy' and headachy - Physical therapy has improved  flexibility and strength - Left knee and hip remain the most symptomatic areas  Neurological symptoms of the left lower extremity - Decreased tingling and 'pins and needles' sensation in the outer left thigh, indicating improvement - Outer thigh symptoms are less problematic than at initial presentation  Right knee pain - New onset pain over the right knee, attributed to strengthening exercises during physical therapy  Functional status and activity - Physical therapy has led to improved flexibility and strength - 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 Range of motion 0-130 degrees with painless terminal flexion on the left knee. Non-tender patellar tendon on the left knee and IT band. Tenderness at the left greater trochanter. Tenderness throughout the gluteus medius, more at the proximal and distal thirds. Equivocal over chest on the left.  Assessment and Plan Left greater trochanteric bursitis with associated iliotibial band syndrome and left knee pain Chronic bursitis with IT band syndrome and knee pain. Symptoms improve with physical therapy and exercises. Ibuprofen effective; Celebrex  not tolerated. Hip is primary issue; knee and IT band expected to improve as hip resolves. - Continue physical therapy and home exercises. - Use ibuprofen as needed for pain. - Consider Voltaren gel up to four times daily. - Consider cortisone injection if pain persists. - Monitor symptoms and adjust treatment.  Right knee pain New right  knee pain likely compensatory from left side issues. - Monitor right knee pain and adjust physical therapy.  Allergy to naproxen Allergy limits NSAID options. - Avoid naproxen and related NSAIDs. 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  No orders of the defined types were placed in this encounter.    Return in about 2 months (around 12/07/2024) for f/u left hip and left knee.     Selinda JINNY Ku, MD, [facility]   Primary Care Sports Medicine Primary Care and Sports Medicine at [facility]

## 2024-10-14 ENCOUNTER — Ambulatory Visit: Payer: PRIVATE HEALTH INSURANCE

## 2024-10-17 ENCOUNTER — Ambulatory Visit: Payer: PRIVATE HEALTH INSURANCE | Attending: Family Medicine

## 2024-10-17 DIAGNOSIS — G8929 Other chronic pain: Secondary | ICD-10-CM | POA: Diagnosis present

## 2024-10-17 DIAGNOSIS — M25552 Pain in left hip: Secondary | ICD-10-CM | POA: Diagnosis present

## 2024-10-17 DIAGNOSIS — M6281 Muscle weakness (generalized): Secondary | ICD-10-CM | POA: Diagnosis present

## 2024-10-17 DIAGNOSIS — M25562 Pain in left knee: Secondary | ICD-10-CM | POA: Insufficient documentation

## 2024-10-17 DIAGNOSIS — R2689 Other abnormalities of gait and mobility: Secondary | ICD-10-CM | POA: Diagnosis present

## 2024-10-17 NOTE — Therapy (Signed)
 OUTPATIENT PHYSICAL THERAPY HIP/KNEE TREATMENT   Patient Name: Jeanne Velez MRN: [id number] DOB:Dec 12, 1960, 63 y.o., female Today's Date: 10/17/2024  END OF SESSION:  PT End of Session - 10/17/24 1348     Visit Number 6    Number of Visits 25    Date for Recertification  12/05/24    PT Start Time 1348    PT Stop Time 1430    PT Time Calculation (min) 42 min    Activity Tolerance Patient tolerated treatment well    Behavior During Therapy [facility] for tasks assessed/performed            Past Medical History:  Diagnosis Date   Acne    Allergy    Anemia    Back pain    BRCA negative 07/2020   MyRisk neg   Cataracts, bilateral    Colon polyp    Constipation    Family history of breast cancer 07/2020   IBIS=15.1%/riskscore=6.7%   Family history of skin cancer 02/24/2022   Folliculitis    GERD (gastroesophageal reflux disease)    Hyperlipidemia    Hypothyroidism    Joint pain    Macular degeneration    Osteoarthritis    Retinal vasculitis    Stress    Thyroid cancer (HCC)    Past Surgical History:  Procedure Laterality Date   ABDOMINAL HYSTERECTOMY     BREAST BIOPSY Right    yrs ago benign, no marker   COLONOSCOPY WITH PROPOFOL  N/A 12/14/2022   Procedure: COLONOSCOPY WITH PROPOFOL ;  Surgeon: Unk Corinn Skiff, MD;  Location: [facility] ENDOSCOPY;  Service: Gastroenterology;  Laterality: N/A;   FLEXIBLE SIGMOIDOSCOPY N/A 04/28/2021   Procedure: FLEXIBLE SIGMOIDOSCOPY;  Surgeon: Unk Corinn Skiff, MD;  Location: [facility] ENDOSCOPY;  Service: Gastroenterology;  Laterality: N/A;   ovarial cystectomy     THYROIDECTOMY     TOTAL ABDOMINAL HYSTERECTOMY W/ BILATERAL SALPINGOOPHORECTOMY     dermoids, ovar cysts   TUBAL LIGATION     Patient Active Problem List   Diagnosis Date Noted   Greater 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 08/26/2024   It band syndrome, left 08/26/2024   Patellofemoral arthralgia of left knee 08/26/2024   Polyp of ascending colon  12/14/2022   Rectal polyp 12/14/2022   Genetic testing 04/12/2022   Family history of skin cancer 02/24/2022   Stress 12/14/2021   Pre-diabetes 06/28/2021   Vitamin D  deficiency 05/04/2021   Class 1 obesity with serious comorbidity and body mass index (BMI) of 33.0 to 33.9 in adult 05/04/2021   History of colonic polyps    Vasomotor symptoms due to menopause 07/28/2020   Hypothyroidism 07/24/2020   Mixed hyperlipidemia 07/24/2020   Family history of breast cancer 07/24/2020   Arthritis 07/24/2020   History of thyroid cancer 07/24/2020   Close exposure to COVID-19 virus 04/09/2020    PCP: Edman Marsa PARAS, DO  REFERRING PROVIDER: Alvia Selinda PARAS, MD  REFERRING DIAG:  [phone] (ICD-10-CM) - Greater 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  M76.32 (ICD-10-CM) - It band syndrome, left  M25.562 (ICD-10-CM) - Patellofemoral arthralgia of left knee    RATIONALE FOR EVALUATION AND TREATMENT: Rehabilitation  THERAPY DIAG: Muscle weakness (generalized)  Pain in left hip  Other abnormalities of gait and mobility  Chronic pain of left knee  ONSET DATE: Chronic (> 5 Years)  FOLLOW-UP APPT SCHEDULED WITH REFERRING PROVIDER: Yes    SUBJECTIVE:  SUBJECTIVE STATEMENT:    Patient is a 63 year old female presenting to OPPT with a chief concern of Knee and Hip pain.   PERTINENT HISTORY:   Jeanne Velez is a 63 year old female presenting with L hip and knee pain. Patient reports that her pain has been persistent for 5 years and she has previously seen a PT for the pain with moderate relief. She reports going through a weight loss journey. Last year she did a boot camp exercise class and following the course her L knee had some pain. 2 mos ago she was walking on a TM and she noticed her L  hip had a popping sensation. She reports an aching pain throughout the day. She states that the pain will wake her periodically and she has to straighten the leg in order to relieve the symptoms. She reports intermittent numbness/tingling in the L lower leg.  Prior to the recent exacerbation, she was very active and worked with a psychologist, educational 2x/week. She also participated in exercises at the gym 3-4 times a week. She does report if she isn't working she reports a sedentary lifestyle (sitting for about 8-10 hours)   She denies loss of b/b, saddle parasthesia, chills, fevers, nausea.  PAIN:    Pain Intensity: Present: 1-2/10, Best: 0/10, Worst: 5-6/10 Pain location: L Hip and L Knee Pain Quality: intermittent, constant, and aching  Radiating: Yes  Numbness/Tingling: Yes How long can you stand:  History of prior back or hip injury, pain, surgery, or therapy: Yes   PRECAUTIONS: Fall  WEIGHT BEARING RESTRICTIONS: No  FALLS: Has patient fallen in last 6 months? No  Living Environment Lives with: lives with their spouse Lives in: House/apartment Stairs: Yes: Internal: 16 steps; on right going up and External: 5-6 steps; can reach both Has following equipment at home: None  Prior level of function: Independent  Hobbies: Gardening, Outdoor woman, Exercises Classes, Pets, Reading   Patient Goals: I would like to reduce pain, increase strength, ROM and flexibility   OBJECTIVE:  Patient Surveys:  Cognition Patient is oriented to person, place, and time.  Recent memory is intact.  Attention span and concentration are intact.     Gross Musculoskeletal Assessment Tremor: None; Bulk: Normal, no muscle wasting noted; Tone: Normal; No visible step-off along spinal column, no signs of scoliosis, no gross anatomical hip deformities;   GAIT: Distance walked: 20 m  Assistive device utilized: None Level of assistance: Complete Independence Comments:   Posture: Lumbar lordosis:  WNL Iliac crest height: Equal bilaterally Lumbar lateral shift: Negative Leg length: Equal bilaterally;  AROM AROM (Normal range in degrees) AROM   Lumbar   Flexion (65) WNL   Extension (30) WNL   Right lateral flexion (25) WNL  Left lateral flexion (25) WNL  Right rotation (30) WNL  Left rotation (30) WNL      Hip Right Left  Flexion (125) WNL WNL  Extension (15)    Abduction (40)    Adduction (30)    Internal Rotation (45) [facility]  External Rotation (45) WFL WFL      Knee    Flexion (135) WNL WNL  Extension (0) WNL WNL      Ankle    Dorsiflexion (20)    Plantarflexion (50)    Inversion (35)    Eversion (15)    (* = pain; Blank rows = not tested)  LE MMT: MMT (out of 5) Right  Left   Hip flexion 4- 4-  Hip  extension    Hip abduction 4 4-*  Hip adduction    Hip internal rotation 4 4  Hip external rotation 4 4  Knee flexion 4- 4-  Knee extension 4 3+  Ankle dorsiflexion 5 5  Ankle plantarflexion 5 5  Ankle inversion    Ankle eversion    (* = pain; Blank rows = not tested)  Sensation Grossly intact to light touch throughout bilateral LEs as determined by testing dermatomes L2-S2. Proprioception, stereognosis, and hot/cold testing deferred on this date.  Reflexes R/L Knee Jerk (L3/4): 2+/2+    Muscle Length Hamstrings: R: Negative L: Negative  Palpation Location Right Left         Lumbar paraspinals  0  Quadratus Lumborum  0  Iliac Crest  0  ASIS  0  Pubic Rami    Pubic symphysis   Femoral Triangle    Inguinal ligament    Gluteus Maximus  2  Gluteus Medius  2  Deep hip external rotators  2  Sacrum 0  PSIS    Fortin's Area (SIJ)  0  Coccyx   IT BAND   2  Greater Trochanter  2  (Blank rows = not tested) Graded on 0-4 scale (0 = no pain, 1 = pain, 2 = pain with wincing/grimacing/flinching, 3 = pain with withdrawal, 4 = unwilling to allow palpation)  Passive Accessory Intervertebral Motion Deferred  Special Tests Lumbar Radiculopathy  and Discogenic: SLR (SN 92, -LR 0.29): R: Negative L:  Negative Crossed SLR (SP 90): R: Negative L: Negative  Facet Joint: Extension-Rotation (SN 100, -LR 0.0): R: Negative L: Negative  Hip: FABER (SN 81): R: Not Tested L: Negative FADIR (SN 94): R: Not Tested L: Negative  Functional Movement:   Sit To Stand: WNL Stairs: WNL  Squatting: 5 reps, Hip IR/ADD noted in LLE, less weight shift on LLE, narrow Bos. Pt reported moderate weakness in L knee following 5 repetitions   TODAY'S TREATMENT: DATE: 10/17/24  Subjective: Patient reports 0/10 pain in her L hip. Some pain in her R knee 3/10. She reports dealing with mental stressors due to work. She recently flew and was without hip pain. No question or concerns.  Therapeutic Exercise (with intent to increase hip flexor/abductor/extensor strength):  Supine Hip Flexor Up and Over - Saniwipes bottle   L: 3 x 10    R: 3 x 10   Supine Bridges - Legs extended - On heels - on Foam roller   3 x 10 - w/ Green TB pallof press   Side lying Hip Abduction - Straight Leg   L: 3 x 10 - 3# AW     Prone Hip Extension   L: 3 x 10 - 5# AW   Passive Supine Gluteal Stretch  30s/bout x 3 in order to improve tissue extensibility  Passive Piriformis Stretch   30s/bout x 3 in order to improve tissue extensibility  Time spent reviewing HEP, Frequency and added exercises focused on increasing glute med strength.    Therapeutic Activity:  NuStep L6-1 x 6 min x LE only x > 85 spm(Seat 10) for LE endurance and strength for capacity of walking; PT manually adjusted resistance throughout per patient's tolerance.   Kettlebell Squat  1 x 10 - 20#   2 x 10 - 30#   Lateral Stepping with Resistance  1 min - Blue TB around ankles   1 min - Blue TB around ankles    PATIENT EDUCATION:  Education details: Exercise Technique,  HEP  Person educated: Patient Education method: Explanation, Demonstration, and Handouts Education comprehension: verbalized  understanding, returned demonstration, and tactile cues required   HOME EXERCISE PROGRAM:  Access Code: [email or web address] URL: [email or web address] Date: 10/17/2024 Prepared by: Lonni Pall  Exercises - Clamshell with Resistance  - 1 x daily - 3-4 x weekly - 2-3 sets - 10-12 reps - Supine Bridge with Resistance Band  - 1 x daily - 3-4 x weekly - 2-3 sets - 10-12 reps - Sidelying Hip Abduction  - 1 x daily - 3-4 x weekly - 2-3 sets - 10 reps - Side Stepping with Resistance at Ankles  - 1 x daily - 3-4 x weekly - 2-3 sets - 10 reps - Sidelying Hip Abduction with Resistance at Thighs  - 1 x daily - 3-4 x weekly - 2-3 sets - 10-12 reps - Mini Squat with Counter Support  - 1 x daily - 3-4 x weekly - 2-3 sets - 10-12 reps - Supine Gluteus Stretch  - 2 x daily - 7 x weekly - 3 sets - 30-60 hold - Supine Piriformis Stretch  - 2 x daily - 7 x weekly - 3 sets - 30-60 hold - Supine ITB Stretch with Strap  - 2 x daily - 7 x weekly - 2-3 sets - 30s hold - Supine Quadriceps Stretch with Strap on Table  - 2 x daily - 7 x weekly - 2-3 sets - 30s hold   ASSESSMENT:  CLINICAL IMPRESSION: Continued PT POC in management of L hip pain. Session focused on continued gluteal strengthening to patient's tolerance. Good tolerance to progression with all PT interventions; increased resistance with kettlebell squats. She still has moderate to severe pain at the greater trochanter with palpation; PT suspecting bursitis to the Greater Trochanteric area. Patient still presenting with intermittent L hip pain limiting full participation with recreational activities and prolonged sitting/standing. Based on today's performance, pt will continue to benefit from skilled PT in order to facilitate return to PLOF and improve QoL.  OBJECTIVE IMPAIRMENTS: Abnormal gait, decreased activity tolerance, decreased endurance, difficulty walking, decreased strength, increased muscle spasms, impaired sensation, and pain.    ACTIVITY LIMITATIONS: lifting, bending, standing, squatting, sleeping, stairs, and transfers  PARTICIPATION LIMITATIONS: community activity, occupation, and yard work  PERSONAL FACTORS: Age, Fitness, Past/current experiences, Profession, and Time since onset of injury/illness/exacerbation are also affecting patient's functional outcome.   REHAB POTENTIAL: Good  CLINICAL DECISION MAKING: Evolving/moderate complexity  EVALUATION COMPLEXITY: Moderate   GOALS: Goals reviewed with patient? No  SHORT TERM GOALS: Target date: 10/24/2024  Pt will be independent with HEP in order to improve strength and decrease hip pain to improve pain-free function at home and work. Baseline: 09/12/2024: Initial HEP provided  Goal status: INITIAL   LONG TERM GOALS: Target date: 12/05/2024  Pt will increase LEFS by at least 9 points in order to demonstrate significant improvement in lower extremity function.  Baseline: 09/12/2024: 64/80 Goal status: INITIAL  2.  Pt will decrease worst hip pain by at least 3 points on the NPRS in order to demonstrate clinically significant reduction in hip pain. Baseline: 09/12/2024: 6/10 Goal status: INITIAL  3.  Pt will be able to perform 10 squats with proper mechanics and minimal pain (< 3 NPS) in order to demonstrate significant improvements in function and strength for hobbies.     Baseline: 09/12/2024: 5 squats, moderate L knee pain (5/10) Goal status: INITIAL  4.  Pt will increase R/L LE strength by at least 1/2  MMT  (I.e. 4 > 4+) grade in order to demonstrate improvement in strength and function for hobbies and walking.  Baseline: 09/12/2024: see above Goal status: INITIAL  5. Pt will achieve 6-8 hours of sleep per night without report of awakening from hip pain in order to demonstrate significant improvements in pain.  Baseline: 09/12/2024: n/a; 10/09/2024: Pt able to rest at least 7 hrs.  Goal status: INITIAL   PLAN: PT FREQUENCY: 1-2x/week  PT  DURATION: 12 weeks  PLANNED INTERVENTIONS: Therapeutic exercises, Therapeutic activity, Neuromuscular re-education, Balance training, Gait training, Joint mobilization, Electrical stimulation, Cryotherapy, Moist heat, Taping, Manual therapy, and Re-evaluation.  PLAN FOR NEXT SESSION: Review HEP, Initiate hip strengthening, knee strengthening, hip stretches (IT band)    Lonni Pall PT, DPT Physical Therapist-   10/17/2024, 1:53 PM

## 2024-10-22 ENCOUNTER — Ambulatory Visit: Payer: PRIVATE HEALTH INSURANCE

## 2024-10-24 ENCOUNTER — Ambulatory Visit: Payer: PRIVATE HEALTH INSURANCE

## 2024-10-28 ENCOUNTER — Ambulatory Visit: Payer: PRIVATE HEALTH INSURANCE

## 2024-10-28 DIAGNOSIS — G8929 Other chronic pain: Secondary | ICD-10-CM

## 2024-10-28 DIAGNOSIS — M6281 Muscle weakness (generalized): Secondary | ICD-10-CM

## 2024-10-28 DIAGNOSIS — M25552 Pain in left hip: Secondary | ICD-10-CM

## 2024-10-28 DIAGNOSIS — R2689 Other abnormalities of gait and mobility: Secondary | ICD-10-CM

## 2024-10-28 NOTE — Therapy (Signed)
 OUTPATIENT PHYSICAL THERAPY HIP/KNEE TREATMENT   Patient Name: Annalina Needles MRN: [id number] DOB:1961/06/13, 63 y.o., female Today's Date: 10/28/2024  END OF SESSION:  PT End of Session - 10/28/24 1117     Visit Number 7    Number of Visits 25    Date for Recertification  12/05/24    PT Start Time 1116    PT Stop Time 1156    PT Time Calculation (min) 40 min    Activity Tolerance Patient tolerated treatment well    Behavior During Therapy [facility] for tasks assessed/performed             Past Medical History:  Diagnosis Date   Acne    Allergy    Anemia    Back pain    BRCA negative 07/2020   MyRisk neg   Cataracts, bilateral    Colon polyp    Constipation    Family history of breast cancer 07/2020   IBIS=15.1%/riskscore=6.7%   Family history of skin cancer 02/24/2022   Folliculitis    GERD (gastroesophageal reflux disease)    Hyperlipidemia    Hypothyroidism    Joint pain    Macular degeneration    Osteoarthritis    Retinal vasculitis    Stress    Thyroid cancer (HCC)    Past Surgical History:  Procedure Laterality Date   ABDOMINAL HYSTERECTOMY     BREAST BIOPSY Right    yrs ago benign, no marker   COLONOSCOPY WITH PROPOFOL  N/A 12/14/2022   Procedure: COLONOSCOPY WITH PROPOFOL ;  Surgeon: Unk Corinn Skiff, MD;  Location: [facility] ENDOSCOPY;  Service: Gastroenterology;  Laterality: N/A;   FLEXIBLE SIGMOIDOSCOPY N/A 04/28/2021   Procedure: FLEXIBLE SIGMOIDOSCOPY;  Surgeon: Unk Corinn Skiff, MD;  Location: [facility] ENDOSCOPY;  Service: Gastroenterology;  Laterality: N/A;   ovarial cystectomy     THYROIDECTOMY     TOTAL ABDOMINAL HYSTERECTOMY W/ BILATERAL SALPINGOOPHORECTOMY     dermoids, ovar cysts   TUBAL LIGATION     Patient Active Problem List   Diagnosis Date Noted   Greater 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 08/26/2024   It band syndrome, left 08/26/2024   Patellofemoral arthralgia of left knee 08/26/2024   Polyp of ascending colon  12/14/2022   Rectal polyp 12/14/2022   Genetic testing 04/12/2022   Family history of skin cancer 02/24/2022   Stress 12/14/2021   Pre-diabetes 06/28/2021   Vitamin D  deficiency 05/04/2021   Class 1 obesity with serious comorbidity and body mass index (BMI) of 33.0 to 33.9 in adult 05/04/2021   History of colonic polyps    Vasomotor symptoms due to menopause 07/28/2020   Hypothyroidism 07/24/2020   Mixed hyperlipidemia 07/24/2020   Family history of breast cancer 07/24/2020   Arthritis 07/24/2020   History of thyroid cancer 07/24/2020   Close exposure to COVID-19 virus 04/09/2020    PCP: Edman Marsa PARAS, DO  REFERRING PROVIDER: Alvia Selinda PARAS, MD  REFERRING DIAG:  [phone] (ICD-10-CM) - Greater 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  M76.32 (ICD-10-CM) - It band syndrome, left  M25.562 (ICD-10-CM) - Patellofemoral arthralgia of left knee    RATIONALE FOR EVALUATION AND TREATMENT: Rehabilitation  THERAPY DIAG: No diagnosis found.  ONSET DATE: Chronic (> 5 Years)  FOLLOW-UP APPT SCHEDULED WITH REFERRING PROVIDER: Yes    SUBJECTIVE:  SUBJECTIVE STATEMENT:    Patient is a 63 year old female presenting to OPPT with a chief concern of Knee and Hip pain.   PERTINENT HISTORY:   Lashanta Elbe is a 63 year old female presenting with L hip and knee pain. Patient reports that her pain has been persistent for 5 years and she has previously seen a PT for the pain with moderate relief. She reports going through a weight loss journey. Last year she did a boot camp exercise class and following the course her L knee had some pain. 2 mos ago she was walking on a TM and she noticed her L hip had a popping sensation. She reports an aching pain throughout the day. She states that the pain  will wake her periodically and she has to straighten the leg in order to relieve the symptoms. She reports intermittent numbness/tingling in the L lower leg.  Prior to the recent exacerbation, she was very active and worked with a psychologist, educational 2x/week. She also participated in exercises at the gym 3-4 times a week. She does report if she isn't working she reports a sedentary lifestyle (sitting for about 8-10 hours)   She denies loss of b/b, saddle parasthesia, chills, fevers, nausea.  PAIN:    Pain Intensity: Present: 1-2/10, Best: 0/10, Worst: 5-6/10 Pain location: L Hip and L Knee Pain Quality: intermittent, constant, and aching  Radiating: Yes  Numbness/Tingling: Yes How long can you stand:  History of prior back or hip injury, pain, surgery, or therapy: Yes   PRECAUTIONS: Fall  WEIGHT BEARING RESTRICTIONS: No  FALLS: Has patient fallen in last 6 months? No  Living Environment Lives with: lives with their spouse Lives in: House/apartment Stairs: Yes: Internal: 16 steps; on right going up and External: 5-6 steps; can reach both Has following equipment at home: None  Prior level of function: Independent  Hobbies: Gardening, Outdoor woman, Exercises Classes, Pets, Reading   Patient Goals: I would like to reduce pain, increase strength, ROM and flexibility   OBJECTIVE:  Patient Surveys:  Cognition Patient is oriented to person, place, and time.  Recent memory is intact.  Attention span and concentration are intact.     Gross Musculoskeletal Assessment Tremor: None; Bulk: Normal, no muscle wasting noted; Tone: Normal; No visible step-off along spinal column, no signs of scoliosis, no gross anatomical hip deformities;   GAIT: Distance walked: 20 m  Assistive device utilized: None Level of assistance: Complete Independence Comments:   Posture: Lumbar lordosis: WNL Iliac crest height: Equal bilaterally Lumbar lateral shift: Negative Leg length: Equal  bilaterally;  AROM AROM (Normal range in degrees) AROM   Lumbar   Flexion (65) WNL   Extension (30) WNL   Right lateral flexion (25) WNL  Left lateral flexion (25) WNL  Right rotation (30) WNL  Left rotation (30) WNL      Hip Right Left  Flexion (125) WNL WNL  Extension (15)    Abduction (40)    Adduction (30)    Internal Rotation (45) [facility]  External Rotation (45) WFL WFL      Knee    Flexion (135) WNL WNL  Extension (0) WNL WNL      Ankle    Dorsiflexion (20)    Plantarflexion (50)    Inversion (35)    Eversion (15)    (* = pain; Blank rows = not tested)  LE MMT: MMT (out of 5) Right  Left   Hip flexion 4- 4-  Hip  extension    Hip abduction 4 4-*  Hip adduction    Hip internal rotation 4 4  Hip external rotation 4 4  Knee flexion 4- 4-  Knee extension 4 3+  Ankle dorsiflexion 5 5  Ankle plantarflexion 5 5  Ankle inversion    Ankle eversion    (* = pain; Blank rows = not tested)  Sensation Grossly intact to light touch throughout bilateral LEs as determined by testing dermatomes L2-S2. Proprioception, stereognosis, and hot/cold testing deferred on this date.  Reflexes R/L Knee Jerk (L3/4): 2+/2+    Muscle Length Hamstrings: R: Negative L: Negative  Palpation Location Right Left         Lumbar paraspinals  0  Quadratus Lumborum  0  Iliac Crest  0  ASIS  0  Pubic Rami    Pubic symphysis   Femoral Triangle    Inguinal ligament    Gluteus Maximus  2  Gluteus Medius  2  Deep hip external rotators  2  Sacrum 0  PSIS    Fortin's Area (SIJ)  0  Coccyx   IT BAND   2  Greater Trochanter  2  (Blank rows = not tested) Graded on 0-4 scale (0 = no pain, 1 = pain, 2 = pain with wincing/grimacing/flinching, 3 = pain with withdrawal, 4 = unwilling to allow palpation)  Passive Accessory Intervertebral Motion Deferred  Special Tests Lumbar Radiculopathy and Discogenic: SLR (SN 92, -LR 0.29): R: Negative L:  Negative Crossed SLR (SP 90): R:  Negative L: Negative  Facet Joint: Extension-Rotation (SN 100, -LR 0.0): R: Negative L: Negative  Hip: FABER (SN 81): R: Not Tested L: Negative FADIR (SN 94): R: Not Tested L: Negative  Functional Movement:   Sit To Stand: WNL Stairs: WNL  Squatting: 5 reps, Hip IR/ADD noted in LLE, less weight shift on LLE, narrow Bos. Pt reported moderate weakness in L knee following 5 repetitions   TODAY'S TREATMENT: DATE: 10/28/2024  Subjective: Patient reports that she fell last week (Tuesday) in the hotel. She reports that she fell on her R hip and shoulder, she denies head injury or direct trauma from furniture. Patients pain currently 4/10 in her L hip. No question or concerns.  Therapeutic Exercise (with intent to increase hip flexor/abductor/extensor strength):  Hip Matrix Machine   Abduction: R/L: 1 x 10 - 25#, 2 x 10 - 40#  Flexion: R/L 1 x 10 - 25#, 2 x 10 - 40#   Standing Hip Extension   1 x 10 - YTB around ankle   1 x 10 - RTB around ankle  Standing Glute Kickback   2 x 10    Supine Glute Crossover Stretch   L: 30s/bout x 3 bouts in order to improve tissue extensibility  Supine Piriformis Stretch  L: 30s/bout x 3 bouts in order to improve tissue extensibility   Therapeutic Activity:  NuStep L6-1 x 6 min x LE only x > 85 spm(Seat 10) for LE endurance and strength for capacity of walking; PT manually adjusted resistance throughout per patient's tolerance.   Lateral Stepping with Resistance  2 x 10 - Blue TB around ankles   2 x 10 - Blue TB around ankles, RTB around thighs  2 x 10 - Blue TB around ankles, RTB around thighs  Lateral Step Down - BUE Support   R/L: 2 x 10    PATIENT EDUCATION:  Education details: Exercise Technique, HEP  Person educated: Patient Education method: Explanation, Demonstration, and  Handouts Education comprehension: verbalized understanding, returned demonstration, and tactile cues required   HOME EXERCISE PROGRAM:  Access Code:  [email or web address] URL: [email or web address] Date: 10/17/2024 Prepared by: Lonni Pall  Exercises - Clamshell with Resistance  - 1 x daily - 3-4 x weekly - 2-3 sets - 10-12 reps - Supine Bridge with Resistance Band  - 1 x daily - 3-4 x weekly - 2-3 sets - 10-12 reps - Sidelying Hip Abduction  - 1 x daily - 3-4 x weekly - 2-3 sets - 10 reps - Side Stepping with Resistance at Ankles  - 1 x daily - 3-4 x weekly - 2-3 sets - 10 reps - Sidelying Hip Abduction with Resistance at Thighs  - 1 x daily - 3-4 x weekly - 2-3 sets - 10-12 reps - Mini Squat with Counter Support  - 1 x daily - 3-4 x weekly - 2-3 sets - 10-12 reps - Supine Gluteus Stretch  - 2 x daily - 7 x weekly - 3 sets - 30-60 hold - Supine Piriformis Stretch  - 2 x daily - 7 x weekly - 3 sets - 30-60 hold - Supine ITB Stretch with Strap  - 2 x daily - 7 x weekly - 2-3 sets - 30s hold - Supine Quadriceps Stretch with Strap on Table  - 2 x daily - 7 x weekly - 2-3 sets - 30s hold   ASSESSMENT:  CLINICAL IMPRESSION: Continued PT POC in management of L hip/IT band pain. Patient without significant pain in the R hip following fall last week; point tenderness along the R greater trochanter. She tolerated all PT interventions without pain in the R/L hip. PT progressed lateral stepping with increased resistance. Continued focus on gluteal strengthening with hip extension and abduction against resistance. Patient still presenting with intermittent L hip pain limiting full participation with recreational activities and prolonged sitting/standing. Based on today's performance, pt will continue to benefit from skilled PT in order to facilitate return to PLOF and improve QoL.  OBJECTIVE IMPAIRMENTS: Abnormal gait, decreased activity tolerance, decreased endurance, difficulty walking, decreased strength, increased muscle spasms, impaired sensation, and pain.   ACTIVITY LIMITATIONS: lifting, bending, standing, squatting, sleeping, stairs, and  transfers  PARTICIPATION LIMITATIONS: community activity, occupation, and yard work  PERSONAL FACTORS: Age, Fitness, Past/current experiences, Profession, and Time since onset of injury/illness/exacerbation are also affecting patient's functional outcome.   REHAB POTENTIAL: Good  CLINICAL DECISION MAKING: Evolving/moderate complexity  EVALUATION COMPLEXITY: Moderate   GOALS: Goals reviewed with patient? No  SHORT TERM GOALS: Target date: 10/24/2024  Pt will be independent with HEP in order to improve strength and decrease hip pain to improve pain-free function at home and work. Baseline: 09/12/2024: Initial HEP provided  Goal status: INITIAL   LONG TERM GOALS: Target date: 12/05/2024  Pt will increase LEFS by at least 9 points in order to demonstrate significant improvement in lower extremity function.  Baseline: 09/12/2024: 64/80 Goal status: INITIAL  2.  Pt will decrease worst hip pain by at least 3 points on the NPRS in order to demonstrate clinically significant reduction in hip pain. Baseline: 09/12/2024: 6/10 Goal status: INITIAL  3.  Pt will be able to perform 10 squats with proper mechanics and minimal pain (< 3 NPS) in order to demonstrate significant improvements in function and strength for hobbies.     Baseline: 09/12/2024: 5 squats, moderate L knee pain (5/10) Goal status: INITIAL  4.  Pt will increase R/L LE strength by at least 1/2 MMT  (I.e.  4 > 4+) grade in order to demonstrate improvement in strength and function for hobbies and walking.  Baseline: 09/12/2024: see above Goal status: INITIAL  5. Pt will achieve 6-8 hours of sleep per night without report of awakening from hip pain in order to demonstrate significant improvements in pain.  Baseline: 09/12/2024: n/a; 10/09/2024: Pt able to rest at least 7 hrs.  Goal status: INITIAL   PLAN: PT FREQUENCY: 1-2x/week  PT DURATION: 12 weeks  PLANNED INTERVENTIONS: Therapeutic exercises, Therapeutic  activity, Neuromuscular re-education, Balance training, Gait training, Joint mobilization, Electrical stimulation, Cryotherapy, Moist heat, Taping, Manual therapy, and Re-evaluation.  PLAN FOR NEXT SESSION: Review HEP, Initiate hip strengthening, knee strengthening, hip stretches (IT band)    Lonni Pall PT, DPT Physical Therapist- [place]  10/28/2024, 11:17 AM

## 2024-10-31 ENCOUNTER — Ambulatory Visit: Payer: PRIVATE HEALTH INSURANCE

## 2024-10-31 ENCOUNTER — Inpatient Hospital Stay
Admission: RE | Admit: 2024-10-31 | Discharge: 2024-10-31 | Payer: PRIVATE HEALTH INSURANCE | Attending: Family Medicine | Admitting: Family Medicine

## 2024-10-31 DIAGNOSIS — R2689 Other abnormalities of gait and mobility: Secondary | ICD-10-CM

## 2024-10-31 DIAGNOSIS — Z1231 Encounter for screening mammogram for malignant neoplasm of breast: Secondary | ICD-10-CM | POA: Insufficient documentation

## 2024-10-31 DIAGNOSIS — M25552 Pain in left hip: Secondary | ICD-10-CM

## 2024-10-31 DIAGNOSIS — G8929 Other chronic pain: Secondary | ICD-10-CM

## 2024-10-31 DIAGNOSIS — M6281 Muscle weakness (generalized): Secondary | ICD-10-CM | POA: Diagnosis not present

## 2024-10-31 NOTE — Therapy (Signed)
 OUTPATIENT PHYSICAL THERAPY HIP/KNEE TREATMENT   Patient Name: Nailani Full MRN: [id number] DOB:17-Jan-1961, 63 y.o., female Today's Date: 10/31/2024  END OF SESSION:  PT End of Session - 10/31/24 1142     Visit Number 8    Number of Visits 25    Date for Recertification  12/05/24    PT Start Time 1130    PT Stop Time 1210    PT Time Calculation (min) 40 min    Activity Tolerance Patient tolerated treatment well    Behavior During Therapy [facility] for tasks assessed/performed              Past Medical History:  Diagnosis Date   Acne    Allergy    Anemia    Back pain    BRCA negative 07/2020   MyRisk neg   Cataracts, bilateral    Colon polyp    Constipation    Family history of breast cancer 07/2020   IBIS=15.1%/riskscore=6.7%   Family history of skin cancer 02/24/2022   Folliculitis    GERD (gastroesophageal reflux disease)    Hyperlipidemia    Hypothyroidism    Joint pain    Macular degeneration    Osteoarthritis    Retinal vasculitis    Stress    Thyroid cancer (HCC)    Past Surgical History:  Procedure Laterality Date   ABDOMINAL HYSTERECTOMY     BREAST BIOPSY Right    yrs ago benign, no marker   COLONOSCOPY WITH PROPOFOL  N/A 12/14/2022   Procedure: COLONOSCOPY WITH PROPOFOL ;  Surgeon: Unk Corinn Skiff, MD;  Location: [facility] ENDOSCOPY;  Service: Gastroenterology;  Laterality: N/A;   FLEXIBLE SIGMOIDOSCOPY N/A 04/28/2021   Procedure: FLEXIBLE SIGMOIDOSCOPY;  Surgeon: Unk Corinn Skiff, MD;  Location: [facility] ENDOSCOPY;  Service: Gastroenterology;  Laterality: N/A;   ovarial cystectomy     THYROIDECTOMY     TOTAL ABDOMINAL HYSTERECTOMY W/ BILATERAL SALPINGOOPHORECTOMY     dermoids, ovar cysts   TUBAL LIGATION     Patient Active Problem List   Diagnosis Date Noted   Greater 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 08/26/2024   It band syndrome, left 08/26/2024   Patellofemoral arthralgia of left knee 08/26/2024   Polyp of ascending colon  12/14/2022   Rectal polyp 12/14/2022   Genetic testing 04/12/2022   Family history of skin cancer 02/24/2022   Stress 12/14/2021   Pre-diabetes 06/28/2021   Vitamin D  deficiency 05/04/2021   Class 1 obesity with serious comorbidity and body mass index (BMI) of 33.0 to 33.9 in adult 05/04/2021   History of colonic polyps    Vasomotor symptoms due to menopause 07/28/2020   Hypothyroidism 07/24/2020   Mixed hyperlipidemia 07/24/2020   Family history of breast cancer 07/24/2020   Arthritis 07/24/2020   History of thyroid cancer 07/24/2020   Close exposure to COVID-19 virus 04/09/2020    PCP: Edman Marsa PARAS, DO  REFERRING PROVIDER: Alvia Selinda PARAS, MD  REFERRING DIAG:  [id number] (ICD-10-CM) - Greater 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  M76.32 (ICD-10-CM) - It band syndrome, left  M25.562 (ICD-10-CM) - Patellofemoral arthralgia of left knee    RATIONALE FOR EVALUATION AND TREATMENT: Rehabilitation  THERAPY DIAG: Muscle weakness (generalized)  Pain in left hip  Other abnormalities of gait and mobility  Chronic pain of left knee  ONSET DATE: Chronic (> 5 Years)  FOLLOW-UP APPT SCHEDULED WITH REFERRING PROVIDER: Yes    SUBJECTIVE:  SUBJECTIVE STATEMENT:    Patient is a 63 year old female presenting to OPPT with a chief concern of Knee and Hip pain.   PERTINENT HISTORY:   Jeanne Velez is a 63 year old female presenting with L hip and knee pain. Patient reports that her pain has been persistent for 5 years and she has previously seen a PT for the pain with moderate relief. She reports going through a weight loss journey. Last year she did a boot camp exercise class and following the course her L knee had some pain. 2 mos ago she was walking on a TM and she noticed her L  hip had a popping sensation. She reports an aching pain throughout the day. She states that the pain will wake her periodically and she has to straighten the leg in order to relieve the symptoms. She reports intermittent numbness/tingling in the L lower leg.  Prior to the recent exacerbation, she was very active and worked with a psychologist, educational 2x/week. She also participated in exercises at the gym 3-4 times a week. She does report if she isn't working she reports a sedentary lifestyle (sitting for about 8-10 hours)   She denies loss of b/b, saddle parasthesia, chills, fevers, nausea.  PAIN:    Pain Intensity: Present: 1-2/10, Best: 0/10, Worst: 5-6/10 Pain location: L Hip and L Knee Pain Quality: intermittent, constant, and aching  Radiating: Yes  Numbness/Tingling: Yes How long can you stand:  History of prior back or hip injury, pain, surgery, or therapy: Yes   PRECAUTIONS: Fall  WEIGHT BEARING RESTRICTIONS: No  FALLS: Has patient fallen in last 6 months? No  Living Environment Lives with: lives with their spouse Lives in: House/apartment Stairs: Yes: Internal: 16 steps; on right going up and External: 5-6 steps; can reach both Has following equipment at home: None  Prior level of function: Independent  Hobbies: Gardening, Outdoor woman, Exercises Classes, Pets, Reading   Patient Goals: I would like to reduce pain, increase strength, ROM and flexibility   OBJECTIVE:  Patient Surveys:  Cognition Patient is oriented to person, place, and time.  Recent memory is intact.  Attention span and concentration are intact.     Gross Musculoskeletal Assessment Tremor: None; Bulk: Normal, no muscle wasting noted; Tone: Normal; No visible step-off along spinal column, no signs of scoliosis, no gross anatomical hip deformities;   GAIT: Distance walked: 20 m  Assistive device utilized: None Level of assistance: Complete Independence Comments:   Posture: Lumbar lordosis:  WNL Iliac crest height: Equal bilaterally Lumbar lateral shift: Negative Leg length: Equal bilaterally;  AROM AROM (Normal range in degrees) AROM   Lumbar   Flexion (65) WNL   Extension (30) WNL   Right lateral flexion (25) WNL  Left lateral flexion (25) WNL  Right rotation (30) WNL  Left rotation (30) WNL      Hip Right Left  Flexion (125) WNL WNL  Extension (15)    Abduction (40)    Adduction (30)    Internal Rotation (45) [facility]  External Rotation (45) WFL WFL      Knee    Flexion (135) WNL WNL  Extension (0) WNL WNL      Ankle    Dorsiflexion (20)    Plantarflexion (50)    Inversion (35)    Eversion (15)    (* = pain; Blank rows = not tested)  LE MMT: MMT (out of 5) Right  Left   Hip flexion 4- 4-  Hip  extension    Hip abduction 4 4-*  Hip adduction    Hip internal rotation 4 4  Hip external rotation 4 4  Knee flexion 4- 4-  Knee extension 4 3+  Ankle dorsiflexion 5 5  Ankle plantarflexion 5 5  Ankle inversion    Ankle eversion    (* = pain; Blank rows = not tested)  Sensation Grossly intact to light touch throughout bilateral LEs as determined by testing dermatomes L2-S2. Proprioception, stereognosis, and hot/cold testing deferred on this date.  Reflexes R/L Knee Jerk (L3/4): 2+/2+    Muscle Length Hamstrings: R: Negative L: Negative  Palpation Location Right Left         Lumbar paraspinals  0  Quadratus Lumborum  0  Iliac Crest  0  ASIS  0  Pubic Rami    Pubic symphysis   Femoral Triangle    Inguinal ligament    Gluteus Maximus  2  Gluteus Medius  2  Deep hip external rotators  2  Sacrum 0  PSIS    Fortin's Area (SIJ)  0  Coccyx   IT BAND   2  Greater Trochanter  2  (Blank rows = not tested) Graded on 0-4 scale (0 = no pain, 1 = pain, 2 = pain with wincing/grimacing/flinching, 3 = pain with withdrawal, 4 = unwilling to allow palpation)  Passive Accessory Intervertebral Motion Deferred  Special Tests Lumbar Radiculopathy  and Discogenic: SLR (SN 92, -LR 0.29): R: Negative L:  Negative Crossed SLR (SP 90): R: Negative L: Negative  Facet Joint: Extension-Rotation (SN 100, -LR 0.0): R: Negative L: Negative  Hip: FABER (SN 81): R: Not Tested L: Negative FADIR (SN 94): R: Not Tested L: Negative  Functional Movement:   Sit To Stand: WNL Stairs: WNL  Squatting: 5 reps, Hip IR/ADD noted in LLE, less weight shift on LLE, narrow Bos. Pt reported moderate weakness in L knee following 5 repetitions   TODAY'S TREATMENT: DATE: 10/31/2024  Subjective: Patient with minimal pain in bilateral hips, rated at 1/10 NPS.  No question or concerns.  Therapeutic Exercise (with intent to increase core stabilization/hip flexor/abductor/extensor strength):  Hip Matrix Machine   Abduction: R/L: 1 x 10 - 25#, 2 x 10 - 40#  Supine Bridge  1 x 10   2 x 10 - LE extended on foam roller   Hip Flexor Up and Over   2 x 10 - Light resistance at foot  Supine 90/90 with OH reach   3 x 10 - Weighted Dowel (6#)   Standing ITB Stretch on Wall   R/L: 30s/bout x 2 in order to improve ROM and tissue extensibility  Standing ITB   R/L: 30s/bout x 2 in order to improve ROM and tissue extensibility  Supine Glute Crossover Stretch   R/L: 1 min ea leg order to improve tissue extensibility  Supine Piriformis Stretch  R/L: 1 min ea leg in order to improve tissue extensibility   Therapeutic Activity:  NuStep L6-1 x 6 min x LE only x > 85 spm(Seat 10) for LE endurance and strength for capacity of walking; PT manually adjusted resistance throughout per patient's tolerance.   Lateral Stepping with Resistance  4 x 12' - Black TB around thighs  4 x 12' - Black TB around ankles  Lateral Step Down - BUE Support   R/L: 2 x 8 - minor pain in the L knee across patellar tendon    PATIENT EDUCATION:  Education details: Exercise Technique, HEP  Person educated: Patient Education method: Explanation, Demonstration, and  Handouts Education comprehension: verbalized understanding, returned demonstration, and tactile cues required   HOME EXERCISE PROGRAM:  Access Code: [email or web address] URL: [email or web address] Date: 10/31/2024 Prepared by: Lonni Pall  Exercises - Clamshell with Resistance  - 1 x daily - 3-4 x weekly - 2-3 sets - 10-12 reps - Supine Bridge with Resistance Band  - 1 x daily - 3-4 x weekly - 2-3 sets - 10-12 reps - Sidelying Hip Abduction  - 1 x daily - 3-4 x weekly - 2-3 sets - 10 reps - Side Stepping with Resistance at Ankles  - 1 x daily - 3-4 x weekly - 2-3 sets - 10 reps - Sidelying Hip Abduction with Resistance at Thighs  - 1 x daily - 3-4 x weekly - 2-3 sets - 10-12 reps - Mini Squat with Counter Support  - 1 x daily - 3-4 x weekly - 2-3 sets - 10-12 reps - Supine Gluteus Stretch  - 2 x daily - 7 x weekly - 3 sets - 30-60 hold - Supine Piriformis Stretch  - 2 x daily - 7 x weekly - 3 sets - 30-60 hold - Supine ITB Stretch with Strap  - 2 x daily - 7 x weekly - 2-3 sets - 30s hold - Supine Quadriceps Stretch with Strap on Table  - 2 x daily - 7 x weekly - 2-3 sets - 30s hold - ITB Stretch at Wall  - 2 x daily - 7 x weekly - 2-3 sets - 30s hold - Standing ITB Stretch  - 2 x daily - 7 x weekly - 2-3 sets - 30s hold   ASSESSMENT:  CLINICAL IMPRESSION: Continued PT POC in management of L hip/IT band pain. Residual pain in the L hip from the fall last week has resolved. PT session focused on improving gluteal strength and lengthening muscles associated with IT band. Additional standing stretches for ITB with good return demonstration. Patient tolerated increased with intensity and transitioning to compound exercises from mat level exercises. PT encouraged continued adherence to HEP in order to maintain progress. Patient still presenting with intermittent L hip pain limiting full participation with recreational activities and prolonged sitting/standing. Based on today's  performance, pt will continue to benefit from skilled PT in order to facilitate return to PLOF and improve QoL.  OBJECTIVE IMPAIRMENTS: Abnormal gait, decreased activity tolerance, decreased endurance, difficulty walking, decreased strength, increased muscle spasms, impaired sensation, and pain.   ACTIVITY LIMITATIONS: lifting, bending, standing, squatting, sleeping, stairs, and transfers  PARTICIPATION LIMITATIONS: community activity, occupation, and yard work  PERSONAL FACTORS: Age, Fitness, Past/current experiences, Profession, and Time since onset of injury/illness/exacerbation are also affecting patient's functional outcome.   REHAB POTENTIAL: Good  CLINICAL DECISION MAKING: Evolving/moderate complexity  EVALUATION COMPLEXITY: Moderate   GOALS: Goals reviewed with patient? No  SHORT TERM GOALS: Target date: 10/24/2024  Pt will be independent with HEP in order to improve strength and decrease hip pain to improve pain-free function at home and work. Baseline: 09/12/2024: Initial HEP provided  Goal status: INITIAL   LONG TERM GOALS: Target date: 12/05/2024  Pt will increase LEFS by at least 9 points in order to demonstrate significant improvement in lower extremity function.  Baseline: 09/12/2024: 64/80 Goal status: INITIAL  2.  Pt will decrease worst hip pain by at least 3 points on the NPRS in order to demonstrate clinically significant reduction in hip pain. Baseline: 09/12/2024: 6/10 Goal status: INITIAL  3.  Pt will  be able to perform 10 squats with proper mechanics and minimal pain (< 3 NPS) in order to demonstrate significant improvements in function and strength for hobbies.     Baseline: 09/12/2024: 5 squats, moderate L knee pain (5/10) Goal status: INITIAL  4.  Pt will increase R/L LE strength by at least 1/2 MMT  (I.e. 4 > 4+) grade in order to demonstrate improvement in strength and function for hobbies and walking.  Baseline: 09/12/2024: see above Goal  status: INITIAL  5. Pt will achieve 6-8 hours of sleep per night without report of awakening from hip pain in order to demonstrate significant improvements in pain.  Baseline: 09/12/2024: n/a; 10/09/2024: Pt able to rest at least 7 hrs.  Goal status: INITIAL   PLAN: PT FREQUENCY: 1-2x/week  PT DURATION: 12 weeks  PLANNED INTERVENTIONS: Therapeutic exercises, Therapeutic activity, Neuromuscular re-education, Balance training, Gait training, Joint mobilization, Electrical stimulation, Cryotherapy, Moist heat, Taping, Manual therapy, and Re-evaluation.  PLAN FOR NEXT SESSION: Review HEP, Initiate hip strengthening, knee strengthening, hip stretches (IT band)    Lonni Pall PT, DPT Physical Therapist- [place]  10/31/2024, 11:43 AM

## 2024-11-04 ENCOUNTER — Ambulatory Visit: Payer: Self-pay | Admitting: Obstetrics and Gynecology

## 2024-11-04 ENCOUNTER — Ambulatory Visit: Payer: PRIVATE HEALTH INSURANCE

## 2024-11-04 DIAGNOSIS — M6281 Muscle weakness (generalized): Secondary | ICD-10-CM | POA: Diagnosis not present

## 2024-11-04 DIAGNOSIS — R2689 Other abnormalities of gait and mobility: Secondary | ICD-10-CM

## 2024-11-04 DIAGNOSIS — L700 Acne vulgaris: Secondary | ICD-10-CM

## 2024-11-04 DIAGNOSIS — G8929 Other chronic pain: Secondary | ICD-10-CM

## 2024-11-04 DIAGNOSIS — M25552 Pain in left hip: Secondary | ICD-10-CM

## 2024-11-04 NOTE — Therapy (Signed)
 " OUTPATIENT PHYSICAL THERAPY HIP/KNEE TREATMENT   Patient Name: Jeanne Velez MRN: [id number] DOB:November 20, 1960, 63 y.o.,, female Today's Date: 11/04/2024  END OF SESSION:  PT End of Session - 11/04/24 1113     Visit Number 9    Number of Visits 25    Date for Recertification  12/05/24    PT Start Time 1115    PT Stop Time 1155    PT Time Calculation (min) 40 min    Activity Tolerance Patient tolerated treatment well    Behavior During Therapy [facility] for tasks assessed/performed              Past Medical History:  Diagnosis Date   Acne    Allergy    Anemia    Back pain    BRCA negative 07/2020   MyRisk neg   Cataracts, bilateral    Colon polyp    Constipation    Family history of breast cancer 07/2020   IBIS=15.1%/riskscore=6.7%   Family history of skin cancer 02/24/2022   Folliculitis    GERD (gastroesophageal reflux disease)    Hyperlipidemia    Hypothyroidism    Joint pain    Macular degeneration    Osteoarthritis    Retinal vasculitis    Stress    Thyroid cancer (HCC)    Past Surgical History:  Procedure Laterality Date   ABDOMINAL HYSTERECTOMY     BREAST BIOPSY Right    yrs ago benign, no marker   COLONOSCOPY WITH PROPOFOL  N/A 12/14/2022   Procedure: COLONOSCOPY WITH PROPOFOL ;  Surgeon: Unk Corinn Skiff, MD;  Location: [facility] ENDOSCOPY;  Service: Gastroenterology;  Laterality: N/A;   FLEXIBLE SIGMOIDOSCOPY N/A 04/28/2021   Procedure: FLEXIBLE SIGMOIDOSCOPY;  Surgeon: Unk Corinn Skiff, MD;  Location: [facility] ENDOSCOPY;  Service: Gastroenterology;  Laterality: N/A;   ovarial cystectomy     THYROIDECTOMY     TOTAL ABDOMINAL HYSTERECTOMY W/ BILATERAL SALPINGOOPHORECTOMY     dermoids, ovar cysts   TUBAL LIGATION     Patient Active Problem List   Diagnosis Date Noted   Greater 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 08/26/2024   It band syndrome, left 08/26/2024   Patellofemoral arthralgia of left knee 08/26/2024   Polyp of ascending colon  12/14/2022   Rectal polyp 12/14/2022   Genetic testing 04/12/2022   Family history of skin cancer 02/24/2022   Stress 12/14/2021   Pre-diabetes 06/28/2021   Vitamin D  deficiency 05/04/2021   Class 1 obesity with serious comorbidity and body mass index (BMI) of 33.0 to 33.9 in adult 05/04/2021   History of colonic polyps    Vasomotor symptoms due to menopause 07/28/2020   Hypothyroidism 07/24/2020   Mixed hyperlipidemia 07/24/2020   Family history of breast cancer 07/24/2020   Arthritis 07/24/2020   History of thyroid cancer 07/24/2020   Close exposure to COVID-19 virus 04/09/2020    PCP: Edman Marsa PARAS, DO  REFERRING PROVIDER: Alvia Selinda PARAS, MD  REFERRING DIAG:  [phone] (ICD-10-CM) - Greater 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  M76.32 (ICD-10-CM) - It band syndrome, left  M25.562 (ICD-10-CM) - Patellofemoral arthralgia of left knee    RATIONALE FOR EVALUATION AND TREATMENT: Rehabilitation  THERAPY DIAG: Muscle weakness (generalized)  Pain in left hip  Other abnormalities of gait and mobility  Chronic pain of left knee  ONSET DATE: Chronic (> 5 Years)  FOLLOW-UP APPT SCHEDULED WITH REFERRING PROVIDER: Yes    SUBJECTIVE:  SUBJECTIVE STATEMENT:    Patient is a 63 year old female presenting to OPPT with a chief concern of Knee and Hip pain.   PERTINENT HISTORY:   Jeanne Velez is a 63 year old female presenting with L hip and knee pain. Patient reports that her pain has been persistent for 5 years and she has previously seen a PT for the pain with moderate relief. She reports going through a weight loss journey. Last year she did a boot camp exercise class and following the course her L knee had some pain. 2 mos ago she was walking on a TM and she noticed her L  hip had a popping sensation. She reports an aching pain throughout the day. She states that the pain will wake her periodically and she has to straighten the leg in order to relieve the symptoms. She reports intermittent numbness/tingling in the L lower leg.  Prior to the recent exacerbation, she was very active and worked with a psychologist, educational 2x/week. She also participated in exercises at the gym 3-4 times a week. She does report if she isn't working she reports a sedentary lifestyle (sitting for about 8-10 hours)   She denies loss of b/b, saddle parasthesia, chills, fevers, nausea.  PAIN:    Pain Intensity: Present: 1-2/10, Best: 0/10, Worst: 5-6/10 Pain location: L Hip and L Knee Pain Quality: intermittent, constant, and aching  Radiating: Yes  Numbness/Tingling: Yes How long can you stand:  History of prior back or hip injury, pain, surgery, or therapy: Yes   PRECAUTIONS: Fall  WEIGHT BEARING RESTRICTIONS: No  FALLS: Has patient fallen in last 6 months? No  Living Environment Lives with: lives with their spouse Lives in: House/apartment Stairs: Yes: Internal: 16 steps; on right going up and External: 5-6 steps; can reach both Has following equipment at home: None  Prior level of function: Independent  Hobbies: Gardening, Outdoor woman, Exercises Classes, Pets, Reading   Patient Goals: I would like to reduce pain, increase strength, ROM and flexibility   OBJECTIVE:  Patient Surveys:  Cognition Patient is oriented to person, place, and time.  Recent memory is intact.  Attention span and concentration are intact.     Gross Musculoskeletal Assessment Tremor: None; Bulk: Normal, no muscle wasting noted; Tone: Normal; No visible step-off along spinal column, no signs of scoliosis, no gross anatomical hip deformities;   GAIT: Distance walked: 20 m  Assistive device utilized: None Level of assistance: Complete Independence Comments:   Posture: Lumbar lordosis:  WNL Iliac crest height: Equal bilaterally Lumbar lateral shift: Negative Leg length: Equal bilaterally;  AROM AROM (Normal range in degrees) AROM   Lumbar   Flexion (65) WNL   Extension (30) WNL   Right lateral flexion (25) WNL  Left lateral flexion (25) WNL  Right rotation (30) WNL  Left rotation (30) WNL      Hip Right Left  Flexion (125) WNL WNL  Extension (15)    Abduction (40)    Adduction (30)    Internal Rotation (45) [facility]  External Rotation (45) WFL WFL      Knee    Flexion (135) WNL WNL  Extension (0) WNL WNL      Ankle    Dorsiflexion (20)    Plantarflexion (50)    Inversion (35)    Eversion (15)    (* = pain; Blank rows = not tested)  LE MMT: MMT (out of 5) Right  Left   Hip flexion 4- 4-  Hip  extension    Hip abduction 4 4-*  Hip adduction    Hip internal rotation 4 4  Hip external rotation 4 4  Knee flexion 4- 4-  Knee extension 4 3+  Ankle dorsiflexion 5 5  Ankle plantarflexion 5 5  Ankle inversion    Ankle eversion    (* = pain; Blank rows = not tested)  Sensation Grossly intact to light touch throughout bilateral LEs as determined by testing dermatomes L2-S2. Proprioception, stereognosis, and hot/cold testing deferred on this date.  Reflexes R/L Knee Jerk (L3/4): 2+/2+    Muscle Length Hamstrings: R: Negative L: Negative  Palpation Location Right Left         Lumbar paraspinals  0  Quadratus Lumborum  0  Iliac Crest  0  ASIS  0  Pubic Rami    Pubic symphysis   Femoral Triangle    Inguinal ligament    Gluteus Maximus  2  Gluteus Medius  2  Deep hip external rotators  2  Sacrum 0  PSIS    Fortin's Area (SIJ)  0  Coccyx   IT BAND   2  Greater Trochanter  2  (Blank rows = not tested) Graded on 0-4 scale (0 = no pain, 1 = pain, 2 = pain with wincing/grimacing/flinching, 3 = pain with withdrawal, 4 = unwilling to allow palpation)  Passive Accessory Intervertebral Motion Deferred  Special Tests Lumbar Radiculopathy  and Discogenic: SLR (SN 92, -LR 0.29): R: Negative L:  Negative Crossed SLR (SP 90): R: Negative L: Negative  Facet Joint: Extension-Rotation (SN 100, -LR 0.0): R: Negative L: Negative  Hip: FABER (SN 81): R: Not Tested L: Negative FADIR (SN 94): R: Not Tested L: Negative  Functional Movement:   Sit To Stand: WNL Stairs: WNL  Squatting: 5 reps, Hip IR/ADD noted in LLE, less weight shift on LLE, narrow Bos. Pt reported moderate weakness in L knee following 5 repetitions   TODAY'S TREATMENT: DATE: 11/04/2024  Subjective: Patient reports 1-2/10 NPS in L knee and hip. R knee is improving however she still has some pain.  No question or concerns.  Therapeutic Exercise (with intent to increase core stabilization/hip flexor/abductor/extensor strength):  Omega Cable Machine   Knee Extension   1 x 10 -15#    1 x 10 - 10#    1 x 10 - 15# - pain at 90 deg   Hamstring Curl    1 x 10 - 20#    1 x 10 - 25#    1 x 10 - 30#  Supine Bridge  3 x 10 - LE extended on foam roller   Supine 90/90 with Bolster Support   4 x 10s hold  Dead Bug   3 x 10 - with alternating UE with 2 Kg [employer] Dog  3 x 10 - VC for neutral spine, tactile cue for improved hip extension   Passive Lumbar Cross Over   R/L: 30s/bout x 2 ea side in order to improve lumbar/hip tissue extensibility  Passive Piriformis Stretch   R/L: 30s/bout x 2 ea side in order to improve hip tissue extensibility  Passive ITB Crossover Stretch    L: 30s/bout x 2 in order to improve tissue extensibility in ITB.    Therapeutic Activity:  NuStep L6-2 x 6 min x LE only x > 90 spm(Seat 10) for LE endurance and strength for capacity of walking; PT manually adjusted resistance throughout per patient's tolerance.   Lateral Step  Down - BUE Support   R/L: 2 x 10 - minor pain in the L knee across patellar tendon - improved with PT facilitating medial tracking     PATIENT EDUCATION:  Education details: Exercise Technique,  HEP  Person educated: Patient Education method: Explanation, Demonstration, and Handouts Education comprehension: verbalized understanding, returned demonstration, and tactile cues required   HOME EXERCISE PROGRAM:  Access Code: [email or web address] URL: [email or web address] Date: 10/31/2024 Prepared by: Lonni Pall  Exercises - Clamshell with Resistance  - 1 x daily - 3-4 x weekly - 2-3 sets - 10-12 reps - Supine Bridge with Resistance Band  - 1 x daily - 3-4 x weekly - 2-3 sets - 10-12 reps - Sidelying Hip Abduction  - 1 x daily - 3-4 x weekly - 2-3 sets - 10 reps - Side Stepping with Resistance at Ankles  - 1 x daily - 3-4 x weekly - 2-3 sets - 10 reps - Sidelying Hip Abduction with Resistance at Thighs  - 1 x daily - 3-4 x weekly - 2-3 sets - 10-12 reps - Mini Squat with Counter Support  - 1 x daily - 3-4 x weekly - 2-3 sets - 10-12 reps - Supine Gluteus Stretch  - 2 x daily - 7 x weekly - 3 sets - 30-60 hold - Supine Piriformis Stretch  - 2 x daily - 7 x weekly - 3 sets - 30-60 hold - Supine ITB Stretch with Strap  - 2 x daily - 7 x weekly - 2-3 sets - 30s hold - Supine Quadriceps Stretch with Strap on Table  - 2 x daily - 7 x weekly - 2-3 sets - 30s hold - ITB Stretch at Wall  - 2 x daily - 7 x weekly - 2-3 sets - 30s hold - Standing ITB Stretch  - 2 x daily - 7 x weekly - 2-3 sets - 30s hold   ASSESSMENT:  CLINICAL IMPRESSION: Continued PT POC in management of L hip/IT band pain. Time spent on knee strengthening with cable resistance. Patient with pain at 90 deg of knee flexion in L knee most likely from the maximum approximation of patellofemoral joint. Same pain with lateral step down with minor improvements following manual patellar tracking. Patient with good demonstration of maintaining core stability and neutral spine with core exercises involving limb movements. She continues to demonstrate improvements in LE strength and pain in the L hip/knee. PT to reassess  progress towards patient goals in following appt. Based on today's performance, pt will continue to benefit from skilled PT in order to facilitate return to PLOF and improve QoL.  OBJECTIVE IMPAIRMENTS: Abnormal gait, decreased activity tolerance, decreased endurance, difficulty walking, decreased strength, increased muscle spasms, impaired sensation, and pain.   ACTIVITY LIMITATIONS: lifting, bending, standing, squatting, sleeping, stairs, and transfers  PARTICIPATION LIMITATIONS: community activity, occupation, and yard work  PERSONAL FACTORS: Age, Fitness, Past/current experiences, Profession, and Time since onset of injury/illness/exacerbation are also affecting patient's functional outcome.   REHAB POTENTIAL: Good  CLINICAL DECISION MAKING: Evolving/moderate complexity  EVALUATION COMPLEXITY: Moderate   GOALS: Goals reviewed with patient? No  SHORT TERM GOALS: Target date: 10/24/2024  Pt will be independent with HEP in order to improve strength and decrease hip pain to improve pain-free function at home and work. Baseline: 09/12/2024: Initial HEP provided  Goal status: INITIAL   LONG TERM GOALS: Target date: 12/05/2024  Pt will increase LEFS by at least 9 points in order to demonstrate significant improvement in lower extremity function.  Baseline: 09/12/2024: 64/80 Goal status: INITIAL  2.  Pt will decrease worst hip pain by at least 3 points on the NPRS in order to demonstrate clinically significant reduction in hip pain. Baseline: 09/12/2024: 6/10 Goal status: INITIAL  3.  Pt will be able to perform 10 squats with proper mechanics and minimal pain (< 3 NPS) in order to demonstrate significant improvements in function and strength for hobbies.     Baseline: 09/12/2024: 5 squats, moderate L knee pain (5/10) Goal status: INITIAL  4.  Pt will increase R/L LE strength by at least 1/2 MMT  (I.e. 4 > 4+) grade in order to demonstrate improvement in strength and function for  hobbies and walking.  Baseline: 09/12/2024: see above Goal status: INITIAL  5. Pt will achieve 6-8 hours of sleep per night without report of awakening from hip pain in order to demonstrate significant improvements in pain.  Baseline: 09/12/2024: n/a; 10/09/2024: Pt able to rest at least 7 hrs.  Goal status: INITIAL   PLAN: PT FREQUENCY: 1-2x/week  PT DURATION: 12 weeks  PLANNED INTERVENTIONS: Therapeutic exercises, Therapeutic activity, Neuromuscular re-education, Balance training, Gait training, Joint mobilization, Electrical stimulation, Cryotherapy, Moist heat, Taping, Manual therapy, and Re-evaluation.  PLAN FOR NEXT SESSION: Review HEP, Initiate hip strengthening, knee strengthening, hip stretches (IT band)    Lonni Pall PT, DPT Physical Therapist- [place]  11/04/2024, 11:14 AM  "

## 2024-11-04 NOTE — Progress Notes (Signed)
 Patient here today for a TheraClear session for acne vulgaris.    Treatment # 12 Treatment Area: Face and Neck Energy: 4 Vacuum: 2 Pulse: 750 Pulse Delay: double   Photos taken and placed in media.  Patient understands and agrees she has not taken any forms of Isotretinoin  within the last 4-6 months, she has not taken any Doxycycline  or used topical Retin-A .      Alan Pizza, RMA

## 2024-11-05 ENCOUNTER — Encounter: Payer: Self-pay | Admitting: Family Medicine

## 2024-11-05 ENCOUNTER — Telehealth (INDEPENDENT_AMBULATORY_CARE_PROVIDER_SITE_OTHER): Payer: PRIVATE HEALTH INSURANCE | Admitting: Family Medicine

## 2024-11-05 DIAGNOSIS — M19041 Primary osteoarthritis, right hand: Secondary | ICD-10-CM | POA: Diagnosis not present

## 2024-11-05 DIAGNOSIS — M67441 Ganglion, right hand: Secondary | ICD-10-CM | POA: Diagnosis not present

## 2024-11-05 DIAGNOSIS — M19042 Primary osteoarthritis, left hand: Secondary | ICD-10-CM

## 2024-11-05 NOTE — Progress Notes (Signed)
 "  Subjective:    Patient ID: Jeanne Velez, female    DOB: [birth year], 63 y.o.   MRN: [id number]  Jeanne Velez is a 63 y.o. female presenting on 11/05/2024 for Finger Nodule / Cyst   Virtual / Telehealth Encounter - Video Visit via MyChart The purpose of this virtual visit is to provide medical care while limiting exposure to the novel coronavirus (COVID19) for both patient and office staff.  Consent was obtained for remote visit:  Yes.   Answered questions that patient had about telehealth interaction:  Yes.   I discussed the limitations, risks, security and privacy concerns of performing an evaluation and management service by video/telephone. I also discussed with the patient that there may be a patient responsible charge related to this service. The patient expressed understanding and agreed to proceed.  Patient Location: Home Provider Location: [facility] (Office)  Participants in virtual visit: - Patient: Jeanne Velez - CMA: Alan Fontana CMA - Provider: Dr Edman   HPI  Discussed the use of AI scribe software for clinical note transcription with the patient, who gave verbal consent to proceed.  History of Present Illness   Jeanne Velez is a 63 year old female with a history of arthritis and thyroid cancer who presents with a firm, mobile nodule on her finger.  R Index Finger Digital nodule vs Ganglion Cyst - Firm, mobile nodule located on the side of the finger at the knuckle, protruding approximately one eighth of an inch toward the middle finger - Nodule present for a few months, with initial change noted in mid-November - Nodule prevents wearing a ring on the affected finger due to increased size - Associated with stiffness at night and pain in the morning - Impaired finger mobility due to the nodule  Arthralgia and joint stiffness - History of arthritis affecting wrists and thumb joints - Current increased discomfort in wrists and thumb joints -  Symptoms impact daily activities, including typing and work-related tasks - Difficulty lifting weights due to joint discomfort      10/07/2024    8:35 AM 08/26/2024    8:50 AM 08/09/2024   10:40 AM  Depression screen PHQ 2/9  Decreased Interest 0 0 0  Down, Depressed, Hopeless 0 0 0  PHQ - 2 Score 0 0 0  Altered sleeping   1  Tired, decreased energy   1  Change in appetite   0  Feeling bad or failure about yourself    0  Trouble concentrating   0  Moving slowly or fidgety/restless   0  Suicidal thoughts   0  PHQ-9 Score   2   Difficult doing work/chores   Not difficult at all     Data saved with a previous flowsheet row definition       08/09/2024   10:40 AM 05/01/2024    8:50 AM 12/11/2023    9:40 AM 04/25/2023    8:49 AM  GAD 7 : Generalized Anxiety Score  Nervous, Anxious, on Edge 0 2 1 0  Control/stop worrying 0 0 0 0  Worry too much - different things 0 0 0 0  Trouble relaxing 0 1 1 0  Restless 0 0 0 0  Easily annoyed or irritable 1 1 1  0  Afraid - awful might happen 0 0 0 0  Total GAD 7 Score 1 4 3  0  Anxiety Difficulty Not difficult at all Not difficult at all Not difficult at all Not difficult at all  Social History[1]  Review of Systems Per HPI unless specifically indicated above     Objective:    There were no vitals taken for this visit.  Wt Readings from Last 3 Encounters:  10/07/24 179 lb (81.2 kg)  08/26/24 178 lb (80.7 kg)  08/09/24 174 lb 8 oz (79.2 kg)     Physical Exam  Note examination was completely remotely via video observation objective data only  Gen - well-appearing, no acute distress or apparent pain, comfortable HEENT - eyes appear clear without discharge or redness Heart/Lungs - cannot examine virtually - observed no evidence of coughing or labored breathing. Abd - cannot examine virtually  Skin - face visible today- no rash Neuro - awake, alert, oriented Psych - not anxious appearing  Ortho - R Index finger dorsal PIP  with larger 1-2 cm nodular cystic structure   Results for orders placed or performed in visit on 05/01/24  TSH   Collection Time: 05/01/24  9:34 AM  Result Value Ref Range   TSH 0.40 0.40 - 4.50 mIU/L  Lipid panel   Collection Time: 05/01/24  9:34 AM  Result Value Ref Range   Cholesterol 159 <200 mg/dL   HDL 51 > OR = 50 mg/dL   Triglycerides 885 <849 mg/dL   LDL Cholesterol (Calc) 87 mg/dL (calc)   Total CHOL/HDL Ratio 3.1 <5.0 (calc)   Non-HDL Cholesterol (Calc) 108 <130 mg/dL (calc)  Hemoglobin J8r   Collection Time: 05/01/24  9:34 AM  Result Value Ref Range   Hgb A1c MFr Bld 5.2 <5.7 %   Mean Plasma Glucose 103 mg/dL   eAG (mmol/L) 5.7 mmol/L  CBC with Differential/Platelet   Collection Time: 05/01/24  9:34 AM  Result Value Ref Range   WBC 6.9 3.8 - 10.8 Thousand/uL   RBC 4.46 3.80 - 5.10 Million/uL   Hemoglobin 13.6 11.7 - 15.5 g/dL   HCT 58.2 64.9 - 54.9 %   MCV 93.5 80.0 - 100.0 fL   MCH 30.5 27.0 - 33.0 pg   MCHC 32.6 32.0 - 36.0 g/dL   RDW 88.1 88.9 - 84.9 %   Platelets 276 140 - 400 Thousand/uL   MPV 10.1 7.5 - 12.5 fL   Neutro Abs 4,354 1,500 - 7,800 cells/uL   Absolute Lymphocytes 2,029 850 - 3,900 cells/uL   Absolute Monocytes 359 200 - 950 cells/uL   Eosinophils Absolute 117 15 - 500 cells/uL   Basophils Absolute 41 0 - 200 cells/uL   Neutrophils Relative % 63.1 %   Total Lymphocyte 29.4 %   Monocytes Relative 5.2 %   Eosinophils Relative 1.7 %   Basophils Relative 0.6 %  Comprehensive metabolic panel with GFR   Collection Time: 05/01/24  9:34 AM  Result Value Ref Range   Glucose, Bld 84 65 - 99 mg/dL   BUN 16 7 - 25 mg/dL   Creat 9.10 9.49 - 8.94 mg/dL   eGFR 74 > OR = 60 fO/fpw/8.26f7   BUN/Creatinine Ratio SEE NOTE: 6 - 22 (calc)   Sodium 138 135 - 146 mmol/L   Potassium 4.9 3.5 - 5.3 mmol/L   Chloride 103 98 - 110 mmol/L   CO2 29 20 - 32 mmol/L   Calcium 9.8 8.6 - 10.4 mg/dL   Total Protein 7.4 6.1 - 8.1 g/dL   Albumin 4.4 3.6 - 5.1 g/dL    Globulin 3.0 1.9 - 3.7 g/dL (calc)   AG Ratio 1.5 1.0 - 2.5 (calc)   Total Bilirubin 1.2 0.2 -  1.2 mg/dL   Alkaline phosphatase (APISO) 70 37 - 153 U/L   AST 13 10 - 35 U/L   ALT 11 6 - 29 U/L  T4, free   Collection Time: 05/01/24  9:34 AM  Result Value Ref Range   Free T4 2.0 (H) 0.8 - 1.8 ng/dL      Assessment & Plan:   Problem List Items Addressed This Visit   None Visit Diagnoses       Ganglion cyst of joint of finger of right hand    -  Primary   Relevant Orders   Ambulatory referral to Orthopedic Surgery     Primary osteoarthritis of both hands       Relevant Orders   Ambulatory referral to Orthopedic Surgery       Ganglion cyst of finger, R index Onset 1-2+ months approx. Firm, mobile nodule on finger suggests cystic structure. Limited by virtual evaluation. No imaging done. Differential includes ganglion cyst, joint cyst, or mucinous digital cyst. Unlikely to resolve spontaneously.  - Referred to hand orthopedic specialist, Dr. Hardin Czar, for evaluation and potential excision. - Deferred x-ray until specialist evaluation.  Osteoarthritis of hand and wrist Chronic osteoarthritis with increased discomfort in wrists and thumb joints.  Discussed LDRT as noninvasive treatment option with promising results from [place] studies. More data needed. - Provided information on low dose radiation therapy (LDRT) for osteoarthritis. - Encouraged exploration of LDRT as a treatment option. - can refer to [facility] Rad On[facility] Dr Lenn if interested in this new service.       Orders Placed This Encounter  Procedures   Ambulatory referral to Orthopedic Surgery    Referral Priority:   Routine    Referral Type:   Surgical    Referral Reason:   Specialty Services Required    Requested Specialty:   Orthopedic Surgery    Number of Visits Requested:   1    No orders of the defined types were placed in this encounter.   Follow up plan: Return if symptoms worsen or fail  to improve.   Patient verbalizes understanding with the above medical recommendations including the limitation of remote medical advice.  Specific follow-up and call-back criteria were given for patient to follow-up or seek medical care more urgently if needed.  Total duration of direct patient care provided via video conference: 15 minutes   Marsa Officer, DO [facility] 11/05/2024, 3:59 PM    [1]  Social History Tobacco Use   Smoking status: Never   Smokeless tobacco: Never  Vaping Use   Vaping status: Never Used  Substance Use Topics   Alcohol use: Yes    Comment: rarely   Drug use: Never   "

## 2024-11-05 NOTE — Patient Instructions (Addendum)
 Likely Ganglion cyst of finger  Referral to Orthopedics Hand specialist. If it is a solid cyst it may warrant excision. If it is a mucus filled cyst they can drain it.  [facility] Address: [address] [place], [place], [place] [address] Phone: [phone] Fax: [phone]  Hardin Slocumb, MD (Hand, Wrist, Elbow specialist)  Please schedule a Follow-up Appointment to: Return if symptoms worsen or fail to improve.  If you have any other questions or concerns, please feel free to call the office or send a message through MyChart. You may also schedule an earlier appointment if necessary.  Additionally, you may be receiving a survey about your experience at our office within a few days to 1 week by e-mail or mail. We value your feedback.  Marsa Officer, DO [facility], [place]

## 2024-11-06 ENCOUNTER — Ambulatory Visit: Payer: PRIVATE HEALTH INSURANCE

## 2024-11-06 DIAGNOSIS — R2689 Other abnormalities of gait and mobility: Secondary | ICD-10-CM

## 2024-11-06 DIAGNOSIS — M6281 Muscle weakness (generalized): Secondary | ICD-10-CM

## 2024-11-06 DIAGNOSIS — M25552 Pain in left hip: Secondary | ICD-10-CM

## 2024-11-06 DIAGNOSIS — G8929 Other chronic pain: Secondary | ICD-10-CM

## 2024-11-06 NOTE — Therapy (Addendum)
 " OUTPATIENT PHYSICAL THERAPY HIP/KNEE TREATMENT/PROGRESS NOTE  Dates of reporting period  09/12/2024  to  11/06/2024     Patient Name: Jeanne Velez MRN: [id number] DOB:02-09-1961, 63 y.o., female Today's Date: 11/06/2024  END OF SESSION:  PT End of Session - 11/06/24 0951     Visit Number 10    Number of Visits 25    Date for Recertification  12/05/24    PT Start Time 0946    PT Stop Time 1030    PT Time Calculation (min) 44 min    Activity Tolerance Patient tolerated treatment well    Behavior During Therapy [facility] for tasks assessed/performed              Past Medical History:  Diagnosis Date   Acne    Allergy    Anemia    Back pain    BRCA negative 07/2020   MyRisk neg   Cataracts, bilateral    Colon polyp    Constipation    Family history of breast cancer 07/2020   IBIS=15.1%/riskscore=6.7%   Family history of skin cancer 02/24/2022   Folliculitis    GERD (gastroesophageal reflux disease)    Hyperlipidemia    Hypothyroidism    Joint pain    Macular degeneration    Osteoarthritis    Retinal vasculitis    Stress    Thyroid cancer (HCC)    Past Surgical History:  Procedure Laterality Date   ABDOMINAL HYSTERECTOMY     BREAST BIOPSY Right    yrs ago benign, no marker   COLONOSCOPY WITH PROPOFOL  N/A 12/14/2022   Procedure: COLONOSCOPY WITH PROPOFOL ;  Surgeon: Unk Corinn Skiff, MD;  Location: [facility] ENDOSCOPY;  Service: Gastroenterology;  Laterality: N/A;   FLEXIBLE SIGMOIDOSCOPY N/A 04/28/2021   Procedure: FLEXIBLE SIGMOIDOSCOPY;  Surgeon: Unk Corinn Skiff, MD;  Location: [facility] ENDOSCOPY;  Service: Gastroenterology;  Laterality: N/A;   ovarial cystectomy     THYROIDECTOMY     TOTAL ABDOMINAL HYSTERECTOMY W/ BILATERAL SALPINGOOPHORECTOMY     dermoids, ovar cysts   TUBAL LIGATION     Patient Active Problem List   Diagnosis Date Noted   Greater 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 08/26/2024   It band syndrome, left 08/26/2024    Patellofemoral arthralgia of left knee 08/26/2024   Polyp of ascending colon 12/14/2022   Rectal polyp 12/14/2022   Genetic testing 04/12/2022   Family history of skin cancer 02/24/2022   Stress 12/14/2021   Pre-diabetes 06/28/2021   Vitamin D  deficiency 05/04/2021   Class 1 obesity with serious comorbidity and body mass index (BMI) of 33.0 to 33.9 in adult 05/04/2021   History of colonic polyps    Vasomotor symptoms due to menopause 07/28/2020   Hypothyroidism 07/24/2020   Mixed hyperlipidemia 07/24/2020   Family history of breast cancer 07/24/2020   Arthritis 07/24/2020   History of thyroid cancer 07/24/2020   Close exposure to COVID-19 virus 04/09/2020    PCP: Edman Marsa PARAS, DO  REFERRING PROVIDER: Alvia Selinda PARAS, MD  REFERRING DIAG:  [phone] (ICD-10-CM) - Greater 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  M76.32 (ICD-10-CM) - It band syndrome, left  M25.562 (ICD-10-CM) - Patellofemoral arthralgia of left knee    RATIONALE FOR EVALUATION AND TREATMENT: Rehabilitation  THERAPY DIAG: Muscle weakness (generalized)  Pain in left hip  Other abnormalities of gait and mobility  Chronic pain of left knee  ONSET DATE: Chronic (> 5 Years)  FOLLOW-UP APPT SCHEDULED WITH REFERRING PROVIDER: Yes    SUBJECTIVE:  SUBJECTIVE STATEMENT:    Patient is a 63 year old female presenting to OPPT with a chief concern of Knee and Hip pain.   PERTINENT HISTORY:   Jeanne Velez is a 63 year old female presenting with L hip and knee pain. Patient reports that her pain has been persistent for 5 years and she has previously seen a PT for the pain with moderate relief. She reports going through a weight loss journey. Last year she did a boot camp exercise class and following the course her L  knee had some pain. 2 mos ago she was walking on a TM and she noticed her L hip had a popping sensation. She reports an aching pain throughout the day. She states that the pain will wake her periodically and she has to straighten the leg in order to relieve the symptoms. She reports intermittent numbness/tingling in the L lower leg.  Prior to the recent exacerbation, she was very active and worked with a psychologist, educational 2x/week. She also participated in exercises at the gym 3-4 times a week. She does report if she isn't working she reports a sedentary lifestyle (sitting for about 8-10 hours)   She denies loss of b/b, saddle parasthesia, chills, fevers, nausea.  PAIN:    Pain Intensity: Present: 1-2/10, Best: 0/10, Worst: 5-6/10 Pain location: L Hip and L Knee Pain Quality: intermittent, constant, and aching  Radiating: Yes  Numbness/Tingling: Yes How long can you stand:  History of prior back or hip injury, pain, surgery, or therapy: Yes   PRECAUTIONS: Fall  WEIGHT BEARING RESTRICTIONS: No  FALLS: Has patient fallen in last 6 months? No  Living Environment Lives with: lives with their spouse Lives in: House/apartment Stairs: Yes: Internal: 16 steps; on right going up and External: 5-6 steps; can reach both Has following equipment at home: None  Prior level of function: Independent  Hobbies: Gardening, Outdoor woman, Exercises Classes, Pets, Reading   Patient Goals: I would like to reduce pain, increase strength, ROM and flexibility   OBJECTIVE:  Patient Surveys:  Cognition Patient is oriented to person, place, and time.  Recent memory is intact.  Attention span and concentration are intact.     Gross Musculoskeletal Assessment Tremor: None; Bulk: Normal, no muscle wasting noted; Tone: Normal; No visible step-off along spinal column, no signs of scoliosis, no gross anatomical hip deformities;   GAIT: Distance walked: 20 m  Assistive device utilized: None Level of  assistance: Complete Independence Comments:   Posture: Lumbar lordosis: WNL Iliac crest height: Equal bilaterally Lumbar lateral shift: Negative Leg length: Equal bilaterally;  AROM AROM (Normal range in degrees) AROM   Lumbar   Flexion (65) WNL   Extension (30) WNL   Right lateral flexion (25) WNL  Left lateral flexion (25) WNL  Right rotation (30) WNL  Left rotation (30) WNL      Hip Right Left  Flexion (125) WNL WNL  Extension (15)    Abduction (40)    Adduction (30)    Internal Rotation (45) [facility]  External Rotation (45) WFL WFL      Knee    Flexion (135) WNL WNL  Extension (0) WNL WNL      Ankle    Dorsiflexion (20)    Plantarflexion (50)    Inversion (35)    Eversion (15)    (* = pain; Blank rows = not tested)  LE MMT: MMT (out of 5) Right  Left   Hip flexion 4- 4-  Hip  extension    Hip abduction 4 4-*  Hip adduction    Hip internal rotation 4 4  Hip external rotation 4 4  Knee flexion 4- 4-  Knee extension 4 3+  Ankle dorsiflexion 5 5  Ankle plantarflexion 5 5  Ankle inversion    Ankle eversion    (* = pain; Blank rows = not tested)  Sensation Grossly intact to light touch throughout bilateral LEs as determined by testing dermatomes L2-S2. Proprioception, stereognosis, and hot/cold testing deferred on this date.  Reflexes R/L Knee Jerk (L3/4): 2+/2+    Muscle Length Hamstrings: R: Negative L: Negative  Palpation Location Right Left         Lumbar paraspinals  0  Quadratus Lumborum  0  Iliac Crest  0  ASIS  0  Pubic Rami    Pubic symphysis   Femoral Triangle    Inguinal ligament    Gluteus Maximus  2  Gluteus Medius  2  Deep hip external rotators  2  Sacrum 0  PSIS    Fortin's Area (SIJ)  0  Coccyx   IT BAND   2  Greater Trochanter  2  (Blank rows = not tested) Graded on 0-4 scale (0 = no pain, 1 = pain, 2 = pain with wincing/grimacing/flinching, 3 = pain with withdrawal, 4 = unwilling to allow palpation)  Passive  Accessory Intervertebral Motion Deferred  Special Tests Lumbar Radiculopathy and Discogenic: SLR (SN 92, -LR 0.29): R: Negative L:  Negative Crossed SLR (SP 90): R: Negative L: Negative  Facet Joint: Extension-Rotation (SN 100, -LR 0.0): R: Negative L: Negative  Hip: FABER (SN 81): R: Not Tested L: Negative FADIR (SN 94): R: Not Tested L: Negative  Functional Movement:   Sit To Stand: WNL Stairs: WNL  Squatting: 5 reps, Hip IR/ADD noted in LLE, less weight shift on LLE, narrow Bos. Pt reported moderate weakness in L knee following 5 repetitions   TODAY'S TREATMENT: DATE: 11/06/2024  Subjective: Patient reports no pain in the  L hip and knee. Additionally she reports minor pain in her R knee. No question or concerns.   Extreme difficulty/unable (0), Quite a bit of difficulty (1), Moderate difficulty (2), Little difficulty (3), No difficulty (4) Survey date:  11/06/24  Any of your usual work, housework or school activities 4  2. Usual hobbies, recreational or sporting activities 3  3. Getting into/out of the bath 4  4. Walking between rooms 4  5. Putting on socks/shoes 4  6. Squatting  3  7. Lifting an object, like a bag of groceries from the floor 4  8. Performing light activities around your home 4  9. Performing heavy activities around your home 3  10. Getting into/out of a car 4  11. Walking 2 blocks 4  12. Walking 1 mile 4  13. Going up/down 10 stairs (1 flight) 4  14. Standing for 1 hour 4  15.  sitting for 1 hour 4  16. Running on even ground 1  17. Running on uneven ground 1  18. Making sharp turns while running fast 1  19. Hopping  2  20. Rolling over in bed 4  Score total:  69/80   Therapeutic Exercise (with intent to increase core stabilization/hip flexor/abductor/extensor strength):  Standing Hip Extension - Straight Leg   R/L: ea leg 2 x 10 - Blue TB around Ankle   Standing Hip Extension - Bend knee for Hamstring   R/L: ea leg 2 x 10 -  Blue TB  around feet   SL Bridges - Supine   R/L: 2 x 10 reps ea leg   Supine Dead Bug   2 x 10 - Alternating UE/LE   Time spent reviewing HEP - Added Dead Bug and Bird Dog to Protocol for improved core strengthening   Therapeutic Activity:  NuStep L6-2 x 6 min x LE only x > 90 spm(Seat 10) for LE endurance and strength for capacity of walking; PT manually adjusted resistance throughout per patient's tolerance.   Lateral Stepping against resistance  4 x 12' blue TB around ankles 4 x 12'  grey TB around ankles  Air Squats   10 reps  Kettlebell Squat   1 x 10 - 20# KB   Reverse Bosu Squats for improved hip/core stability   2 x 10 - 2nd without PT assistance    PATIENT EDUCATION:  Education details: Exercise Technique, HEP  Person educated: Patient Education method: Explanation, Demonstration, and Handouts Education comprehension: verbalized understanding, returned demonstration, and tactile cues required   HOME EXERCISE PROGRAM:  Access Code: [email or web address] URL: [email or web address] Date: 10/31/2024 Prepared by: Lonni Pall  Exercises - Clamshell with Resistance  - 1 x daily - 3-4 x weekly - 2-3 sets - 10-12 reps - Supine Bridge with Resistance Band  - 1 x daily - 3-4 x weekly - 2-3 sets - 10-12 reps - Sidelying Hip Abduction  - 1 x daily - 3-4 x weekly - 2-3 sets - 10 reps - Side Stepping with Resistance at Ankles  - 1 x daily - 3-4 x weekly - 2-3 sets - 10 reps - Sidelying Hip Abduction with Resistance at Thighs  - 1 x daily - 3-4 x weekly - 2-3 sets - 10-12 reps - Mini Squat with Counter Support  - 1 x daily - 3-4 x weekly - 2-3 sets - 10-12 reps - Supine Gluteus Stretch  - 2 x daily - 7 x weekly - 3 sets - 30-60 hold - Supine Piriformis Stretch  - 2 x daily - 7 x weekly - 3 sets - 30-60 hold - Supine ITB Stretch with Strap  - 2 x daily - 7 x weekly - 2-3 sets - 30s hold - Supine Quadriceps Stretch with Strap on Table  - 2 x daily - 7 x weekly - 2-3 sets - 30s  hold - ITB Stretch at Wall  - 2 x daily - 7 x weekly - 2-3 sets - 30s hold - Standing ITB Stretch  - 2 x daily - 7 x weekly - 2-3 sets - 30s hold   ASSESSMENT:  CLINICAL IMPRESSION:  Patient arrives to clinic for 10th visit warranting progress note towards PT goals. Overall the patient has demonstrated significant improvements in LE strength, pain and functional mobility (see goals below). Per self report of LEFS the patient has no difficulty with most functional movements but is limited to running. Her score of 69/80 has increased significantly since evaluation; current score indicating minimal disability based on L hip and knee pain. She still has point tenderness along the Left ITB however she has mitigated her pain with routine stretches and adherence to HEP. She continues to benefit from skilled PT interventions and current PT POC remains appropriate. She still has intermittent pain with specific movements limiting her full participation with community based activities. Pt will continue to benefit from skilled PT interventions in order to maximize return to PLOF and improve QoL.   OBJECTIVE IMPAIRMENTS: Abnormal gait, decreased activity tolerance,  decreased endurance, difficulty walking, decreased strength, increased muscle spasms, impaired sensation, and pain.   ACTIVITY LIMITATIONS: lifting, bending, standing, squatting, sleeping, stairs, and transfers  PARTICIPATION LIMITATIONS: community activity, occupation, and yard work  PERSONAL FACTORS: Age, Fitness, Past/current experiences, Profession, and Time since onset of injury/illness/exacerbation are also affecting patient's functional outcome.   REHAB POTENTIAL: Good  CLINICAL DECISION MAKING: Evolving/moderate complexity  EVALUATION COMPLEXITY: Moderate   GOALS: Goals reviewed with patient? No  SHORT TERM GOALS: Target date: 10/24/2024  Pt will be independent with HEP in order to improve strength and decrease hip pain to  improve pain-free function at home and work. Baseline: 09/12/2024: Initial HEP provided; 11/06/2024: Routine Based HEP completely Ind.  Goal status: Goal Met   LONG TERM GOALS: Target date: 12/05/2024  Pt will increase LEFS by at least 9 points in order to demonstrate significant improvement in lower extremity function.  Baseline: 09/12/2024: 64/80; 11/06/2024: 69/80 - 86% (80/80 = no disability) Goal status: Progressing  2.  Pt will decrease worst hip pain by at least 3 points on the NPRS in order to demonstrate clinically significant reduction in hip pain. Baseline: 09/12/2024: 6/10; 11/06/2024: 3/10  Goal status: Goal Met  3.  Pt will be able to perform 10 squats with proper mechanics and minimal pain (< 3 NPS) in order to demonstrate significant improvements in function and strength for hobbies.     Baseline: 09/12/2024: 5 squats, moderate L knee pain (5/10); 11/06/2024: 10 Reps Completed Pain Free Goal status: Goal Met   4.  Pt will increase R/L LE strength by at least 1/2 MMT  (I.e. 4 > 4+) grade in order to demonstrate improvement in strength and function for hobbies and walking.  Baseline: 09/12/2024: see above; 11/06/2024:  LE MMT: MMT (out of 5) Right  Left  11/06/2024 R/L   Hip flexion 4- 4- 4+/4  Hip extension     Hip abduction 4 4-* 4+/4+ *  Hip adduction     Hip internal rotation 4 4 4+/4+  Hip external rotation 4 4 4+/4+  Knee flexion 4- 4- 4+/4+  Knee extension 4 3+ 4+/4+ *  Ankle dorsiflexion 5 5   Ankle plantarflexion 5 5    Goal status: Progressing  5. Pt will achieve 6-8 hours of sleep per night without report of awakening from hip pain in order to demonstrate significant improvements in pain.  Baseline: 09/12/2024: n/a; 10/09/2024: Pt able to rest at least 7 hrs.; 11/06/2024: In the Last week, one night of waking from hip pain (6-8 hors achived)  Goal status: Goal Met    PLAN: PT FREQUENCY: 1-2x/week  PT DURATION: 12 weeks  PLANNED INTERVENTIONS:  Therapeutic exercises, Therapeutic activity, Neuromuscular re-education, Balance training, Gait training, Joint mobilization, Electrical stimulation, Cryotherapy, Moist heat, Taping, Manual therapy, and Re-evaluation.  PLAN FOR NEXT SESSION: Review HEP, Initiate hip strengthening, knee strengthening, hip stretches (IT band)    Lonni Pall PT, DPT Physical Therapist- [place]  11/06/2024, 9:52 AM  "

## 2024-11-11 ENCOUNTER — Ambulatory Visit: Payer: PRIVATE HEALTH INSURANCE

## 2024-11-11 DIAGNOSIS — M25552 Pain in left hip: Secondary | ICD-10-CM

## 2024-11-11 DIAGNOSIS — M6281 Muscle weakness (generalized): Secondary | ICD-10-CM

## 2024-11-11 DIAGNOSIS — G8929 Other chronic pain: Secondary | ICD-10-CM

## 2024-11-11 DIAGNOSIS — R2689 Other abnormalities of gait and mobility: Secondary | ICD-10-CM

## 2024-11-11 NOTE — Therapy (Signed)
 " OUTPATIENT PHYSICAL THERAPY HIP/KNEE TREATMENT   Patient Name: Jeanne Velez MRN: [id number] DOB:30-Oct-1961, 63 y.o., female Today's Date: 11/11/2024  END OF SESSION:  PT End of Session - 11/11/24 1437     Visit Number 11    Number of Visits 25    Date for Recertification  12/05/24    PT Start Time 1435    PT Stop Time 1515    PT Time Calculation (min) 40 min    Activity Tolerance Patient tolerated treatment well    Behavior During Therapy [facility] for tasks assessed/performed          Past Medical History:  Diagnosis Date   Acne    Allergy    Anemia    Back pain    BRCA negative 07/2020   MyRisk neg   Cataracts, bilateral    Colon polyp    Constipation    Family history of breast cancer 07/2020   IBIS=15.1%/riskscore=6.7%   Family history of skin cancer 02/24/2022   Folliculitis    GERD (gastroesophageal reflux disease)    Hyperlipidemia    Hypothyroidism    Joint pain    Macular degeneration    Osteoarthritis    Retinal vasculitis    Stress    Thyroid cancer (HCC)    Past Surgical History:  Procedure Laterality Date   ABDOMINAL HYSTERECTOMY     BREAST BIOPSY Right    yrs ago benign, no marker   COLONOSCOPY WITH PROPOFOL  N/A 12/14/2022   Procedure: COLONOSCOPY WITH PROPOFOL ;  Surgeon: Unk Corinn Skiff, MD;  Location: [facility] ENDOSCOPY;  Service: Gastroenterology;  Laterality: N/A;   FLEXIBLE SIGMOIDOSCOPY N/A 04/28/2021   Procedure: FLEXIBLE SIGMOIDOSCOPY;  Surgeon: Unk Corinn Skiff, MD;  Location: [facility] ENDOSCOPY;  Service: Gastroenterology;  Laterality: N/A;   ovarial cystectomy     THYROIDECTOMY     TOTAL ABDOMINAL HYSTERECTOMY W/ BILATERAL SALPINGOOPHORECTOMY     dermoids, ovar cysts   TUBAL LIGATION     Patient Active Problem List   Diagnosis Date Noted   Greater 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 08/26/2024   It band syndrome, left 08/26/2024   Patellofemoral arthralgia of left knee 08/26/2024   Polyp of ascending colon 12/14/2022    Rectal polyp 12/14/2022   Genetic testing 04/12/2022   Family history of skin cancer 02/24/2022   Stress 12/14/2021   Pre-diabetes 06/28/2021   Vitamin D  deficiency 05/04/2021   Class 1 obesity with serious comorbidity and body mass index (BMI) of 33.0 to 33.9 in adult 05/04/2021   History of colonic polyps    Vasomotor symptoms due to menopause 07/28/2020   Hypothyroidism 07/24/2020   Mixed hyperlipidemia 07/24/2020   Family history of breast cancer 07/24/2020   Arthritis 07/24/2020   History of thyroid cancer 07/24/2020   Close exposure to COVID-19 virus 04/09/2020    PCP: Edman Marsa PARAS, DO  REFERRING PROVIDER: Alvia Selinda PARAS, MD  REFERRING DIAG:  [phone] (ICD-10-CM) - Greater 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  M76.32 (ICD-10-CM) - It band syndrome, left  M25.562 (ICD-10-CM) - Patellofemoral arthralgia of left knee    RATIONALE FOR EVALUATION AND TREATMENT: Rehabilitation  THERAPY DIAG: Muscle weakness (generalized)  Pain in left hip  Chronic pain of left knee  Other abnormalities of gait and mobility  ONSET DATE: Chronic (> 5 Years)  FOLLOW-UP APPT SCHEDULED WITH REFERRING PROVIDER: Yes    SUBJECTIVE:  SUBJECTIVE STATEMENT:    Patient is a 63 year old female presenting to OPPT with a chief concern of Knee and Hip pain.   PERTINENT HISTORY:   Jeanne Velez is a 63 year old female presenting with L hip and knee pain. Patient reports that her pain has been persistent for 5 years and she has previously seen a PT for the pain with moderate relief. She reports going through a weight loss journey. Last year she did a boot camp exercise class and following the course her L knee had some pain. 2 mos ago she was walking on a TM and she noticed her L hip had a  popping sensation. She reports an aching pain throughout the day. She states that the pain will wake her periodically and she has to straighten the leg in order to relieve the symptoms. She reports intermittent numbness/tingling in the L lower leg.  Prior to the recent exacerbation, she was very active and worked with a psychologist, educational 2x/week. She also participated in exercises at the gym 3-4 times a week. She does report if she isn't working she reports a sedentary lifestyle (sitting for about 8-10 hours)   She denies loss of b/b, saddle parasthesia, chills, fevers, nausea.  PAIN:    Pain Intensity: Present: 1-2/10, Best: 0/10, Worst: 5-6/10 Pain location: L Hip and L Knee Pain Quality: intermittent, constant, and aching  Radiating: Yes  Numbness/Tingling: Yes How long can you stand:  History of prior back or hip injury, pain, surgery, or therapy: Yes   PRECAUTIONS: Fall  WEIGHT BEARING RESTRICTIONS: No  FALLS: Has patient fallen in last 6 months? No  Living Environment Lives with: lives with their spouse Lives in: House/apartment Stairs: Yes: Internal: 16 steps; on right going up and External: 5-6 steps; can reach both Has following equipment at home: None  Prior level of function: Independent  Hobbies: Gardening, Outdoor woman, Exercises Classes, Pets, Reading   Patient Goals: I would like to reduce pain, increase strength, ROM and flexibility   OBJECTIVE:  Patient Surveys:  Cognition Patient is oriented to person, place, and time.  Recent memory is intact.  Attention span and concentration are intact.     Gross Musculoskeletal Assessment Tremor: None; Bulk: Normal, no muscle wasting noted; Tone: Normal; No visible step-off along spinal column, no signs of scoliosis, no gross anatomical hip deformities;   GAIT: Distance walked: 20 m  Assistive device utilized: None Level of assistance: Complete Independence Comments:   Posture: Lumbar lordosis: WNL Iliac  crest height: Equal bilaterally Lumbar lateral shift: Negative Leg length: Equal bilaterally;  AROM AROM (Normal range in degrees) AROM   Lumbar   Flexion (65) WNL   Extension (30) WNL   Right lateral flexion (25) WNL  Left lateral flexion (25) WNL  Right rotation (30) WNL  Left rotation (30) WNL      Hip Right Left  Flexion (125) WNL WNL  Extension (15)    Abduction (40)    Adduction (30)    Internal Rotation (45) [facility]  External Rotation (45) WFL WFL      Knee    Flexion (135) WNL WNL  Extension (0) WNL WNL      Ankle    Dorsiflexion (20)    Plantarflexion (50)    Inversion (35)    Eversion (15)    (* = pain; Blank rows = not tested)  LE MMT: MMT (out of 5) Right  Left   Hip flexion 4- 4-  Hip  extension    Hip abduction 4 4-*  Hip adduction    Hip internal rotation 4 4  Hip external rotation 4 4  Knee flexion 4- 4-  Knee extension 4 3+  Ankle dorsiflexion 5 5  Ankle plantarflexion 5 5  Ankle inversion    Ankle eversion    (* = pain; Blank rows = not tested)  Sensation Grossly intact to light touch throughout bilateral LEs as determined by testing dermatomes L2-S2. Proprioception, stereognosis, and hot/cold testing deferred on this date.  Reflexes R/L Knee Jerk (L3/4): 2+/2+    Muscle Length Hamstrings: R: Negative L: Negative  Palpation Location Right Left         Lumbar paraspinals  0  Quadratus Lumborum  0  Iliac Crest  0  ASIS  0  Pubic Rami    Pubic symphysis   Femoral Triangle    Inguinal ligament    Gluteus Maximus  2  Gluteus Medius  2  Deep hip external rotators  2  Sacrum 0  PSIS    Fortin's Area (SIJ)  0  Coccyx   IT BAND   2  Greater Trochanter  2  (Blank rows = not tested) Graded on 0-4 scale (0 = no pain, 1 = pain, 2 = pain with wincing/grimacing/flinching, 3 = pain with withdrawal, 4 = unwilling to allow palpation)  Passive Accessory Intervertebral Motion Deferred  Special Tests Lumbar Radiculopathy and  Discogenic: SLR (SN 92, -LR 0.29): R: Negative L:  Negative Crossed SLR (SP 90): R: Negative L: Negative  Facet Joint: Extension-Rotation (SN 100, -LR 0.0): R: Negative L: Negative  Hip: FABER (SN 81): R: Not Tested L: Negative FADIR (SN 94): R: Not Tested L: Negative  Functional Movement:   Sit To Stand: WNL Stairs: WNL  Squatting: 5 reps, Hip IR/ADD noted in LLE, less weight shift on LLE, narrow Bos. Pt reported moderate weakness in L knee following 5 repetitions   TODAY'S TREATMENT: DATE: 11/11/2024  Subjective: Patient reports 1/10 in bilateral knees. No pain in the L hip prior to start of session.  No question or concerns.  Therapeutic Exercise (with intent to increase core stabilization/hip flexor/abductor/extensor strength):  Sidelying Hip Abduction L: 1 x 10 - AROM, 2 x 10 5# AW to ankle    Resisted SL Bridges - Supine   R/L: 2 x 10 reps ea leg - 5# AW on torso  Supine Dead Bug   2 x 10 - Alternating UE/LE   Supine 90/90 with OH reach 3 x 10 -weighted dowel    Therapeutic Activity:  NuStep L6-2 x 6 min x LE only x > 90 spm(Seat 10) for LE endurance and strength for capacity of walking; PT manually adjusted resistance throughout per patient's tolerance.  Kettlebell Squat   3 x 10  20# KB    Reverse Bosu Squats for improved hip/core stability   2 x 10 - without PT assistance    PATIENT EDUCATION:  Education details: Exercise Technique, HEP  Person educated: Patient Education method: Explanation, Demonstration, and Handouts Education comprehension: verbalized understanding, returned demonstration, and tactile cues required   HOME EXERCISE PROGRAM:  Access Code: [email or web address] URL: [email or web address] Date: 11/11/2024 Prepared by: Lonni Pall  Exercises - Clamshell with Resistance  - 1 x daily - 3-4 x weekly - 2-3 sets - 10-12 reps - Supine Bridge with Resistance Band  - 1 x daily - 3-4 x weekly - 2-3 sets - 10-12 reps - Side Stepping  with Resistance at Ankles  -  1 x daily - 3-4 x weekly - 2-3 sets - 10 reps - Sidelying Hip Abduction with Resistance at Thighs  - 1 x daily - 3-4 x weekly - 2-3 sets - 10-12 reps - Supine Dead Bug with Leg Extension  - 1 x daily - 3-4 x weekly - 2-3 sets - 10 reps - Bird Dog  - 1 x daily - 3-4 x weekly - 2-3 sets - 10 reps - Supine Gluteus Stretch  - 2 x daily - 7 x weekly - 3 sets - 30-60 hold - Supine Piriformis Stretch  - 2 x daily - 7 x weekly - 3 sets - 30-60 hold - Supine ITB Stretch with Strap  - 2 x daily - 7 x weekly - 2-3 sets - 30s hold - Standing ITB Stretch  - 2 x daily - 7 x weekly - 2-3 sets - 30s hold - Supine Quadriceps Stretch with Strap on Table  - 2 x daily - 7 x weekly - 2-3 sets - 30s hold   ASSESSMENT:  CLINICAL IMPRESSION:  Continued PT POC focused in management of bilateral knee pain and L hip pain assosciated with IT band syndrome. Good tolerance to all interventions without exacerbation of pain in either LE. She demonstrated good ability to maintain neutral pelvis with dynamic limb movements during bird dog. Patient's hip pain has improved significantly however her knee pain has persistently been unchanged. Updated HEP to include additional core and LE exercises. She still has intermittent pain with specific movements limiting her full participation with community based activities. Pt will continue to benefit from skilled PT interventions in order to maximize return to PLOF and improve QoL.   OBJECTIVE IMPAIRMENTS: Abnormal gait, decreased activity tolerance, decreased endurance, difficulty walking, decreased strength, increased muscle spasms, impaired sensation, and pain.   ACTIVITY LIMITATIONS: lifting, bending, standing, squatting, sleeping, stairs, and transfers  PARTICIPATION LIMITATIONS: community activity, occupation, and yard work  PERSONAL FACTORS: Age, Fitness, Past/current experiences, Profession, and Time since onset of injury/illness/exacerbation are  also affecting patient's functional outcome.   REHAB POTENTIAL: Good  CLINICAL DECISION MAKING: Evolving/moderate complexity  EVALUATION COMPLEXITY: Moderate   GOALS: Goals reviewed with patient? No  SHORT TERM GOALS: Target date: 10/24/2024  Pt will be independent with HEP in order to improve strength and decrease hip pain to improve pain-free function at home and work. Baseline: 09/12/2024: Initial HEP provided; 11/06/2024: Routine Based HEP completely Ind.  Goal status: Goal Met   LONG TERM GOALS: Target date: 12/05/2024  Pt will increase LEFS by at least 9 points in order to demonstrate significant improvement in lower extremity function.  Baseline: 09/12/2024: 64/80; 11/06/2024: 69/80 - 86% (80/80 = no disability) Goal status: Progressing  2.  Pt will decrease worst hip pain by at least 3 points on the NPRS in order to demonstrate clinically significant reduction in hip pain. Baseline: 09/12/2024: 6/10; 11/06/2024: 3/10  Goal status: Goal Met  3.  Pt will be able to perform 10 squats with proper mechanics and minimal pain (< 3 NPS) in order to demonstrate significant improvements in function and strength for hobbies.     Baseline: 09/12/2024: 5 squats, moderate L knee pain (5/10); 11/06/2024: 10 Reps Completed Pain Free Goal status: Goal Met   4.  Pt will increase R/L LE strength by at least 1/2 MMT  (I.e. 4 > 4+) grade in order to demonstrate improvement in strength and function for hobbies and walking.  Baseline: 09/12/2024: see above; 11/06/2024:  LE MMT:  MMT (out of 5) Right  Left  11/06/2024 R/L   Hip flexion 4- 4- 4+/4  Hip extension     Hip abduction 4 4-* 4+/4+ *  Hip adduction     Hip internal rotation 4 4 4+/4+  Hip external rotation 4 4 4+/4+  Knee flexion 4- 4- 4+/4+  Knee extension 4 3+ 4+/4+ *  Ankle dorsiflexion 5 5   Ankle plantarflexion 5 5    Goal status: Progressing  5. Pt will achieve 6-8 hours of sleep per night without report of  awakening from hip pain in order to demonstrate significant improvements in pain.  Baseline: 09/12/2024: n/a; 10/09/2024: Pt able to rest at least 7 hrs.; 11/06/2024: In the Last week, one night of waking from hip pain (6-8 hors achived)  Goal status: Goal Met    PLAN: PT FREQUENCY: 1-2x/week  PT DURATION: 12 weeks  PLANNED INTERVENTIONS: Therapeutic exercises, Therapeutic activity, Neuromuscular re-education, Balance training, Gait training, Joint mobilization, Electrical stimulation, Cryotherapy, Moist heat, Taping, Manual therapy, and Re-evaluation.  PLAN FOR NEXT SESSION: Review HEP, Initiate hip strengthening, knee strengthening, hip stretches (IT band)    Lonni Pall PT, DPT Physical Therapist- [place]  11/11/2024, 2:41 PM  "

## 2024-11-13 ENCOUNTER — Ambulatory Visit: Payer: PRIVATE HEALTH INSURANCE

## 2024-11-13 DIAGNOSIS — M6281 Muscle weakness (generalized): Secondary | ICD-10-CM | POA: Diagnosis not present

## 2024-11-13 DIAGNOSIS — M25552 Pain in left hip: Secondary | ICD-10-CM

## 2024-11-13 DIAGNOSIS — G8929 Other chronic pain: Secondary | ICD-10-CM

## 2024-11-13 NOTE — Therapy (Signed)
 " OUTPATIENT PHYSICAL THERAPY HIP/KNEE TREATMENT   Patient Name: Jeanne Velez MRN: [id number] DOB:[birth year], 63 y.o., female Today's Date: 11/13/2024  END OF SESSION:  PT End of Session - 11/13/24 1127     Visit Number 12    Number of Visits 25    Date for Recertification  12/05/24    PT Start Time 1120    PT Stop Time 1200    PT Time Calculation (min) 40 min    Activity Tolerance Patient tolerated treatment well    Behavior During Therapy [facility] for tasks assessed/performed           Past Medical History:  Diagnosis Date   Acne    Allergy    Anemia    Back pain    BRCA negative 07/2020   MyRisk neg   Cataracts, bilateral    Colon polyp    Constipation    Family history of breast cancer 07/2020   IBIS=15.1%/riskscore=6.7%   Family history of skin cancer 02/24/2022   Folliculitis    GERD (gastroesophageal reflux disease)    Hyperlipidemia    Hypothyroidism    Joint pain    Macular degeneration    Osteoarthritis    Retinal vasculitis    Stress    Thyroid cancer (HCC)    Past Surgical History:  Procedure Laterality Date   ABDOMINAL HYSTERECTOMY     BREAST BIOPSY Right    yrs ago benign, no marker   COLONOSCOPY WITH PROPOFOL  N/A 12/14/2022   Procedure: COLONOSCOPY WITH PROPOFOL ;  Surgeon: Unk Corinn Skiff, MD;  Location: [facility] ENDOSCOPY;  Service: Gastroenterology;  Laterality: N/A;   FLEXIBLE SIGMOIDOSCOPY N/A 04/28/2021   Procedure: FLEXIBLE SIGMOIDOSCOPY;  Surgeon: Unk Corinn Skiff, MD;  Location: [facility] ENDOSCOPY;  Service: Gastroenterology;  Laterality: N/A;   ovarial cystectomy     THYROIDECTOMY     TOTAL ABDOMINAL HYSTERECTOMY W/ BILATERAL SALPINGOOPHORECTOMY     dermoids, ovar cysts   TUBAL LIGATION     Patient Active Problem List   Diagnosis Date Noted   Greater 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 08/26/2024   It band syndrome, left 08/26/2024   Patellofemoral arthralgia of left knee 08/26/2024   Polyp of ascending colon  12/14/2022   Rectal polyp 12/14/2022   Genetic testing 04/12/2022   Family history of skin cancer 02/24/2022   Stress 12/14/2021   Pre-diabetes 06/28/2021   Vitamin D  deficiency 05/04/2021   Class 1 obesity with serious comorbidity and body mass index (BMI) of 33.0 to 33.9 in adult 05/04/2021   History of colonic polyps    Vasomotor symptoms due to menopause 07/28/2020   Hypothyroidism 07/24/2020   Mixed hyperlipidemia 07/24/2020   Family history of breast cancer 07/24/2020   Arthritis 07/24/2020   History of thyroid cancer 07/24/2020   Close exposure to COVID-19 virus 04/09/2020    PCP: Edman Marsa PARAS, DO  REFERRING PROVIDER: Alvia Selinda PARAS, MD  REFERRING DIAG:  [phone] (ICD-10-CM) - Greater 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  M76.32 (ICD-10-CM) - It band syndrome, left  M25.562 (ICD-10-CM) - Patellofemoral arthralgia of left knee    RATIONALE FOR EVALUATION AND TREATMENT: Rehabilitation  THERAPY DIAG: Muscle weakness (generalized)  Pain in left hip  Chronic pain of left knee  ONSET DATE: Chronic (> 5 Years)  FOLLOW-UP APPT SCHEDULED WITH REFERRING PROVIDER: Yes    SUBJECTIVE:  SUBJECTIVE STATEMENT:    Patient is a 63 year old female presenting to OPPT with a chief concern of Knee and Hip pain.   PERTINENT HISTORY:   Jeanne Velez is a 63 year old female presenting with L hip and knee pain. Patient reports that her pain has been persistent for 5 years and she has previously seen a PT for the pain with moderate relief. She reports going through a weight loss journey. Last year she did a boot camp exercise class and following the course her L knee had some pain. 2 mos ago she was walking on a TM and she noticed her L hip had a popping sensation. She reports an  aching pain throughout the day. She states that the pain will wake her periodically and she has to straighten the leg in order to relieve the symptoms. She reports intermittent numbness/tingling in the L lower leg.  Prior to the recent exacerbation, she was very active and worked with a psychologist, educational 2x/week. She also participated in exercises at the gym 3-4 times a week. She does report if she isn't working she reports a sedentary lifestyle (sitting for about 8-10 hours)   She denies loss of b/b, saddle parasthesia, chills, fevers, nausea.  PAIN:    Pain Intensity: Present: 1-2/10, Best: 0/10, Worst: 5-6/10 Pain location: L Hip and L Knee Pain Quality: intermittent, constant, and aching  Radiating: Yes  Numbness/Tingling: Yes How long can you stand:  History of prior back or hip injury, pain, surgery, or therapy: Yes   PRECAUTIONS: Fall  WEIGHT BEARING RESTRICTIONS: No  FALLS: Has patient fallen in last 6 months? No  Living Environment Lives with: lives with their spouse Lives in: House/apartment Stairs: Yes: Internal: 16 steps; on right going up and External: 5-6 steps; can reach both Has following equipment at home: None  Prior level of function: Independent  Hobbies: Gardening, Outdoor woman, Exercises Classes, Pets, Reading   Patient Goals: I would like to reduce pain, increase strength, ROM and flexibility   OBJECTIVE:  Patient Surveys:  Cognition Patient is oriented to person, place, and time.  Recent memory is intact.  Attention span and concentration are intact.     Gross Musculoskeletal Assessment Tremor: None; Bulk: Normal, no muscle wasting noted; Tone: Normal; No visible step-off along spinal column, no signs of scoliosis, no gross anatomical hip deformities;   GAIT: Distance walked: 20 m  Assistive device utilized: None Level of assistance: Complete Independence Comments:   Posture: Lumbar lordosis: WNL Iliac crest height: Equal  bilaterally Lumbar lateral shift: Negative Leg length: Equal bilaterally;  AROM AROM (Normal range in degrees) AROM   Lumbar   Flexion (65) WNL   Extension (30) WNL   Right lateral flexion (25) WNL  Left lateral flexion (25) WNL  Right rotation (30) WNL  Left rotation (30) WNL      Hip Right Left  Flexion (125) WNL WNL  Extension (15)    Abduction (40)    Adduction (30)    Internal Rotation (45) [facility]  External Rotation (45) WFL WFL      Knee    Flexion (135) WNL WNL  Extension (0) WNL WNL      Ankle    Dorsiflexion (20)    Plantarflexion (50)    Inversion (35)    Eversion (15)    (* = pain; Blank rows = not tested)  LE MMT: MMT (out of 5) Right  Left   Hip flexion 4- 4-  Hip  extension    Hip abduction 4 4-*  Hip adduction    Hip internal rotation 4 4  Hip external rotation 4 4  Knee flexion 4- 4-  Knee extension 4 3+  Ankle dorsiflexion 5 5  Ankle plantarflexion 5 5  Ankle inversion    Ankle eversion    (* = pain; Blank rows = not tested)  Sensation Grossly intact to light touch throughout bilateral LEs as determined by testing dermatomes L2-S2. Proprioception, stereognosis, and hot/cold testing deferred on this date.  Reflexes R/L Knee Jerk (L3/4): 2+/2+    Muscle Length Hamstrings: R: Negative L: Negative  Palpation Location Right Left         Lumbar paraspinals  0  Quadratus Lumborum  0  Iliac Crest  0  ASIS  0  Pubic Rami    Pubic symphysis   Femoral Triangle    Inguinal ligament    Gluteus Maximus  2  Gluteus Medius  2  Deep hip external rotators  2  Sacrum 0  PSIS    Fortin's Area (SIJ)  0  Coccyx   IT BAND   2  Greater Trochanter  2  (Blank rows = not tested) Graded on 0-4 scale (0 = no pain, 1 = pain, 2 = pain with wincing/grimacing/flinching, 3 = pain with withdrawal, 4 = unwilling to allow palpation)  Passive Accessory Intervertebral Motion Deferred  Special Tests Lumbar Radiculopathy and Discogenic: SLR (SN 92, -LR  0.29): R: Negative L:  Negative Crossed SLR (SP 90): R: Negative L: Negative  Facet Joint: Extension-Rotation (SN 100, -LR 0.0): R: Negative L: Negative  Hip: FABER (SN 81): R: Not Tested L: Negative FADIR (SN 94): R: Not Tested L: Negative  Functional Movement:   Sit To Stand: WNL Stairs: WNL  Squatting: 5 reps, Hip IR/ADD noted in LLE, less weight shift on LLE, narrow Bos. Pt reported moderate weakness in L knee following 5 repetitions   TODAY'S TREATMENT: DATE: 11/13/2024  Subjective: Patient without hip pain, same minimal pain in bilateral knees.  No question or concerns.  Therapeutic Exercise (with intent to increase core stabilization/hip flexor/abductor/extensor strength):  Standing Hip Hike (Pelvic Drop)     R/L: 2 x 12 - multimodal cues for proper form   Standing Resisted Marches against Wall   R/L: 3 x 10 - Red TB Around feet  Resisted SL Bridges - Supine   R/L: 2 x 10 reps ea leg - Red TB around thigh     Therapeutic Activity (involving multiple muscle groups/parameters in order to achieve improved functional movement such as standing, walking, squatting):  NuStep L6-2 x 6 min x LE only x > 90 spm(Seat 10) for LE endurance and strength for capacity of walking; PT manually adjusted resistance throughout per patient's tolerance.  Curtsy Lunge targets the outer glutes  R/L: 3 x 10   Side Plank with Leg Lift to target the hip abductors and core  L side: 4 x 10s hold  Side Shuffling against resistance   4 x 12' - Grey TB around ankle  4 x 12' - Grey TB around ankle    4 x 12' - Grey TB Around ankle    PATIENT EDUCATION:  Education details: Exercise Technique, HEP  Person educated: Patient Education method: Programmer, Multimedia, Demonstration, and Handouts Education comprehension: verbalized understanding, returned demonstration, and tactile cues required   HOME EXERCISE PROGRAM:  Access Code: [email or web address] URL: [email or web address] Date:  11/11/2024 Prepared by: Lonni Pall  Exercises - Clamshell with Resistance  -  1 x daily - 3-4 x weekly - 2-3 sets - 10-12 reps - Supine Bridge with Resistance Band  - 1 x daily - 3-4 x weekly - 2-3 sets - 10-12 reps - Side Stepping with Resistance at Ankles  - 1 x daily - 3-4 x weekly - 2-3 sets - 10 reps - Sidelying Hip Abduction with Resistance at Thighs  - 1 x daily - 3-4 x weekly - 2-3 sets - 10-12 reps - Supine Dead Bug with Leg Extension  - 1 x daily - 3-4 x weekly - 2-3 sets - 10 reps - Bird Dog  - 1 x daily - 3-4 x weekly - 2-3 sets - 10 reps - Supine Gluteus Stretch  - 2 x daily - 7 x weekly - 3 sets - 30-60 hold - Supine Piriformis Stretch  - 2 x daily - 7 x weekly - 3 sets - 30-60 hold - Supine ITB Stretch with Strap  - 2 x daily - 7 x weekly - 2-3 sets - 30s hold - Standing ITB Stretch  - 2 x daily - 7 x weekly - 2-3 sets - 30s hold - Supine Quadriceps Stretch with Strap on Table  - 2 x daily - 7 x weekly - 2-3 sets - 30s hold   ASSESSMENT:  CLINICAL IMPRESSION:  Continued PT POC focused in management of bilateral knee pain and L hip pain assosciated with IT band syndrome.Patient tolerated increased in intensity with hip exercises. Curtsy lunge and hip drop exercises targeting outer gluteal muscles without pain to the greater trochanter or IT band. Patient's hip pain has responded well to progressive gluteal strengthening and IT band stretching. Her knee pain is still persistent however exacerbated with increased activities such as squatting, standing. Activity modification and limiting resistive LE exercises. As POC reaches an end PT will continue to monitor symptoms and progress intensity of LE exercises as tolerated. She still has intermittent pain with specific movements limiting her full participation with community based activities. Pt will continue to benefit from skilled PT interventions in order to maximize return to PLOF and improve QoL.   OBJECTIVE IMPAIRMENTS:  Abnormal gait, decreased activity tolerance, decreased endurance, difficulty walking, decreased strength, increased muscle spasms, impaired sensation, and pain.   ACTIVITY LIMITATIONS: lifting, bending, standing, squatting, sleeping, stairs, and transfers  PARTICIPATION LIMITATIONS: community activity, occupation, and yard work  PERSONAL FACTORS: Age, Fitness, Past/current experiences, Profession, and Time since onset of injury/illness/exacerbation are also affecting patient's functional outcome.   REHAB POTENTIAL: Good  CLINICAL DECISION MAKING: Evolving/moderate complexity  EVALUATION COMPLEXITY: Moderate   GOALS: Goals reviewed with patient? No  SHORT TERM GOALS: Target date: 10/24/2024  Pt will be independent with HEP in order to improve strength and decrease hip pain to improve pain-free function at home and work. Baseline: 09/12/2024: Initial HEP provided; 11/06/2024: Routine Based HEP completely Ind.  Goal status: Goal Met   LONG TERM GOALS: Target date: 12/05/2024  Pt will increase LEFS by at least 9 points in order to demonstrate significant improvement in lower extremity function.  Baseline: 09/12/2024: 64/80; 11/06/2024: 69/80 - 86% (80/80 = no disability) Goal status: Progressing  2.  Pt will decrease worst hip pain by at least 3 points on the NPRS in order to demonstrate clinically significant reduction in hip pain. Baseline: 09/12/2024: 6/10; 11/06/2024: 3/10  Goal status: Goal Met  3.  Pt will be able to perform 10 squats with proper mechanics and minimal pain (< 3 NPS) in order to demonstrate  significant improvements in function and strength for hobbies.     Baseline: 09/12/2024: 5 squats, moderate L knee pain (5/10); 11/06/2024: 10 Reps Completed Pain Free Goal status: Goal Met   4.  Pt will increase R/L LE strength by at least 1/2 MMT  (I.e. 4 > 4+) grade in order to demonstrate improvement in strength and function for hobbies and walking.  Baseline:  09/12/2024: see above; 11/06/2024:  LE MMT: MMT (out of 5) Right  Left  11/06/2024 R/L   Hip flexion 4- 4- 4+/4  Hip extension     Hip abduction 4 4-* 4+/4+ *  Hip adduction     Hip internal rotation 4 4 4+/4+  Hip external rotation 4 4 4+/4+  Knee flexion 4- 4- 4+/4+  Knee extension 4 3+ 4+/4+ *  Ankle dorsiflexion 5 5   Ankle plantarflexion 5 5    Goal status: Progressing  5. Pt will achieve 6-8 hours of sleep per night without report of awakening from hip pain in order to demonstrate significant improvements in pain.  Baseline: 09/12/2024: n/a; 10/09/2024: Pt able to rest at least 7 hrs.; 11/06/2024: In the Last week, one night of waking from hip pain (6-8 hors achived)  Goal status: Goal Met    PLAN: PT FREQUENCY: 1-2x/week  PT DURATION: 12 weeks  PLANNED INTERVENTIONS: Therapeutic exercises, Therapeutic activity, Neuromuscular re-education, Balance training, Gait training, Joint mobilization, Electrical stimulation, Cryotherapy, Moist heat, Taping, Manual therapy, and Re-evaluation.  PLAN FOR NEXT SESSION: Review HEP, Progress hip strengthening, knee strengthening, hip stretches (IT band)    Lonni Pall PT, DPT Physical Therapist- [place]  11/13/2024, 12:18 PM  "

## 2024-11-18 ENCOUNTER — Other Ambulatory Visit: Payer: Self-pay | Admitting: Dermatology

## 2024-11-19 ENCOUNTER — Ambulatory Visit: Payer: PRIVATE HEALTH INSURANCE | Attending: Family Medicine

## 2024-11-19 DIAGNOSIS — R2689 Other abnormalities of gait and mobility: Secondary | ICD-10-CM | POA: Insufficient documentation

## 2024-11-19 DIAGNOSIS — M25562 Pain in left knee: Secondary | ICD-10-CM | POA: Insufficient documentation

## 2024-11-19 DIAGNOSIS — M25552 Pain in left hip: Secondary | ICD-10-CM | POA: Diagnosis present

## 2024-11-19 DIAGNOSIS — M6281 Muscle weakness (generalized): Secondary | ICD-10-CM | POA: Diagnosis present

## 2024-11-19 DIAGNOSIS — G8929 Other chronic pain: Secondary | ICD-10-CM | POA: Insufficient documentation

## 2024-11-19 NOTE — Therapy (Signed)
 " OUTPATIENT PHYSICAL THERAPY HIP/KNEE TREATMENT   Patient Name: Jeanne Velez MRN: [id number] DOB:Mar 20, 1961, 64 y.o., female Today's Date: 11/19/2024  END OF SESSION:  PT End of Session - 11/19/24 1123     Visit Number 13    Number of Visits 25    Date for Recertification  12/05/24    PT Start Time 1120    PT Stop Time 1203    PT Time Calculation (min) 43 min    Activity Tolerance Patient tolerated treatment well    Behavior During Therapy [facility] for tasks assessed/performed           Past Medical History:  Diagnosis Date   Acne    Allergy    Anemia    Back pain    BRCA negative 07/2020   MyRisk neg   Cataracts, bilateral    Colon polyp    Constipation    Family history of breast cancer 07/2020   IBIS=15.1%/riskscore=6.7%   Family history of skin cancer 02/24/2022   Folliculitis    GERD (gastroesophageal reflux disease)    Hyperlipidemia    Hypothyroidism    Joint pain    Macular degeneration    Osteoarthritis    Retinal vasculitis    Stress    Thyroid cancer (HCC)    Past Surgical History:  Procedure Laterality Date   ABDOMINAL HYSTERECTOMY     BREAST BIOPSY Right    yrs ago benign, no marker   COLONOSCOPY WITH PROPOFOL  N/A 12/14/2022   Procedure: COLONOSCOPY WITH PROPOFOL ;  Surgeon: Unk Corinn Skiff, MD;  Location: [facility] ENDOSCOPY;  Service: Gastroenterology;  Laterality: N/A;   FLEXIBLE SIGMOIDOSCOPY N/A 04/28/2021   Procedure: FLEXIBLE SIGMOIDOSCOPY;  Surgeon: Unk Corinn Skiff, MD;  Location: [facility] ENDOSCOPY;  Service: Gastroenterology;  Laterality: N/A;   ovarial cystectomy     THYROIDECTOMY     TOTAL ABDOMINAL HYSTERECTOMY W/ BILATERAL SALPINGOOPHORECTOMY     dermoids, ovar cysts   TUBAL LIGATION     Patient Active Problem List   Diagnosis Date Noted   Greater 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 08/26/2024   It band syndrome, left 08/26/2024   Patellofemoral arthralgia of left knee 08/26/2024   Polyp of ascending colon 12/14/2022    Rectal polyp 12/14/2022   Genetic testing 04/12/2022   Family history of skin cancer 02/24/2022   Stress 12/14/2021   Pre-diabetes 06/28/2021   Vitamin D  deficiency 05/04/2021   Class 1 obesity with serious comorbidity and body mass index (BMI) of 33.0 to 33.9 in adult 05/04/2021   History of colonic polyps    Vasomotor symptoms due to menopause 07/28/2020   Hypothyroidism 07/24/2020   Mixed hyperlipidemia 07/24/2020   Family history of breast cancer 07/24/2020   Arthritis 07/24/2020   History of thyroid cancer 07/24/2020   Close exposure to COVID-19 virus 04/09/2020    PCP: Edman Marsa PARAS, DO  REFERRING PROVIDER: Alvia Selinda PARAS, MD  REFERRING DIAG:  [phone] (ICD-10-CM) - Greater 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  M76.32 (ICD-10-CM) - It band syndrome, left  M25.562 (ICD-10-CM) - Patellofemoral arthralgia of left knee    RATIONALE FOR EVALUATION AND TREATMENT: Rehabilitation  THERAPY DIAG: Muscle weakness (generalized)  Pain in left hip  Chronic pain of left knee  Other abnormalities of gait and mobility  ONSET DATE: Chronic (> 5 Years)  FOLLOW-UP APPT SCHEDULED WITH REFERRING PROVIDER: Yes    SUBJECTIVE:  SUBJECTIVE STATEMENT:    Patient is a 64 year old female presenting to OPPT with a chief concern of Knee and Hip pain.   PERTINENT HISTORY:   Jeanne Velez is a 64 year old female presenting with L hip and knee pain. Patient reports that her pain has been persistent for 5 years and she has previously seen a PT for the pain with moderate relief. She reports going through a weight loss journey. Last year she did a boot camp exercise class and following the course her L knee had some pain. 2 mos ago she was walking on a TM and she noticed her L hip had a  popping sensation. She reports an aching pain throughout the day. She states that the pain will wake her periodically and she has to straighten the leg in order to relieve the symptoms. She reports intermittent numbness/tingling in the L lower leg.  Prior to the recent exacerbation, she was very active and worked with a psychologist, educational 2x/week. She also participated in exercises at the gym 3-4 times a week. She does report if she isn't working she reports a sedentary lifestyle (sitting for about 8-10 hours)   She denies loss of b/b, saddle parasthesia, chills, fevers, nausea.  PAIN:    Pain Intensity: Present: 1-2/10, Best: 0/10, Worst: 5-6/10 Pain location: L Hip and L Knee Pain Quality: intermittent, constant, and aching  Radiating: Yes  Numbness/Tingling: Yes How long can you stand:  History of prior back or hip injury, pain, surgery, or therapy: Yes   PRECAUTIONS: Fall  WEIGHT BEARING RESTRICTIONS: No  FALLS: Has patient fallen in last 6 months? No  Living Environment Lives with: lives with their spouse Lives in: House/apartment Stairs: Yes: Internal: 16 steps; on right going up and External: 5-6 steps; can reach both Has following equipment at home: None  Prior level of function: Independent  Hobbies: Gardening, Outdoor woman, Exercises Classes, Pets, Reading   Patient Goals: I would like to reduce pain, increase strength, ROM and flexibility   OBJECTIVE:  Patient Surveys:  Cognition Patient is oriented to person, place, and time.  Recent memory is intact.  Attention span and concentration are intact.     Gross Musculoskeletal Assessment Tremor: None; Bulk: Normal, no muscle wasting noted; Tone: Normal; No visible step-off along spinal column, no signs of scoliosis, no gross anatomical hip deformities;   GAIT: Distance walked: 20 m  Assistive device utilized: None Level of assistance: Complete Independence Comments:   Posture: Lumbar lordosis: WNL Iliac  crest height: Equal bilaterally Lumbar lateral shift: Negative Leg length: Equal bilaterally;  AROM AROM (Normal range in degrees) AROM   Lumbar   Flexion (65) WNL   Extension (30) WNL   Right lateral flexion (25) WNL  Left lateral flexion (25) WNL  Right rotation (30) WNL  Left rotation (30) WNL      Hip Right Left  Flexion (125) WNL WNL  Extension (15)    Abduction (40)    Adduction (30)    Internal Rotation (45) [facility]  External Rotation (45) WFL WFL      Knee    Flexion (135) WNL WNL  Extension (0) WNL WNL      Ankle    Dorsiflexion (20)    Plantarflexion (50)    Inversion (35)    Eversion (15)    (* = pain; Blank rows = not tested)  LE MMT: MMT (out of 5) Right  Left   Hip flexion 4- 4-  Hip  extension    Hip abduction 4 4-*  Hip adduction    Hip internal rotation 4 4  Hip external rotation 4 4  Knee flexion 4- 4-  Knee extension 4 3+  Ankle dorsiflexion 5 5  Ankle plantarflexion 5 5  Ankle inversion    Ankle eversion    (* = pain; Blank rows = not tested)  Sensation Grossly intact to light touch throughout bilateral LEs as determined by testing dermatomes L2-S2. Proprioception, stereognosis, and hot/cold testing deferred on this date.  Reflexes R/L Knee Jerk (L3/4): 2+/2+    Muscle Length Hamstrings: R: Negative L: Negative  Palpation Location Right Left         Lumbar paraspinals  0  Quadratus Lumborum  0  Iliac Crest  0  ASIS  0  Pubic Rami    Pubic symphysis   Femoral Triangle    Inguinal ligament    Gluteus Maximus  2  Gluteus Medius  2  Deep hip external rotators  2  Sacrum 0  PSIS    Fortin's Area (SIJ)  0  Coccyx   IT BAND   2  Greater Trochanter  2  (Blank rows = not tested) Graded on 0-4 scale (0 = no pain, 1 = pain, 2 = pain with wincing/grimacing/flinching, 3 = pain with withdrawal, 4 = unwilling to allow palpation)  Passive Accessory Intervertebral Motion Deferred  Special Tests Lumbar Radiculopathy and  Discogenic: SLR (SN 92, -LR 0.29): R: Negative L:  Negative Crossed SLR (SP 90): R: Negative L: Negative  Facet Joint: Extension-Rotation (SN 100, -LR 0.0): R: Negative L: Negative  Hip: FABER (SN 81): R: Not Tested L: Negative FADIR (SN 94): R: Not Tested L: Negative  Functional Movement:   Sit To Stand: WNL Stairs: WNL  Squatting: 5 reps, Hip IR/ADD noted in LLE, less weight shift on LLE, narrow Bos. Pt reported moderate weakness in L knee following 5 repetitions   TODAY'S TREATMENT: DATE: 11/19/2024  Subjective: Patient able to Cara Thaxton to the gym without hip pain and clicking in the hip joint. Patient reports that she only felt muscle soreness.  No question or concerns.  Therapeutic Exercise (with intent to increase core stabilization/hip flexor/abductor/extensor strength):  Standing Hip Drop/Hike (Lateral Pelvic Drop)     R/L: 3 x 10   Standing Hip Abduction - Cable Machine  R/L: 3 x 10 - 40#   Supine 90/90 with OH reach (Weighted Dowel)  1 x 10   2 x 10 - with BLE extended during OH reach  Wall Sit for quadricep tendon/muscle strength   4 x 15s hold - increased knee flexion with ea set  Supine Glute Stretch   R/L: 30s/bout x 2 in order to improve muscle tension/tissue extensibility  Supine ITB Stretch   R/L: 30s/bout x 2 in order to improve muscle tension/tissue extensibility    Therapeutic Activity (involving multiple muscle groups/parameters in order to achieve improved functional movement such as standing, walking, squatting):  NuStep - Pace Mode -  L6-2 - 6 min - LE only - > 100 spm (Seat 10) for LE endurance and strength for capacity of walking; PT manually adjusted resistance throughout per patient's tolerance.  Curtsy Lunge targets the outer glutes  R/L: 3 x 10   Barbell Landmine Press for glute and core coordination for lifting   R/L: 2 x 10 - 45# DB   Standing Barbell Torso Twist  2 x 10 alternating dir - 45# barbell    PATIENT EDUCATION:  Education  details: Exercise Technique, HEP  Person educated: Patient Education method: Explanation, Demonstration, and Handouts Education comprehension: verbalized understanding, returned demonstration, and tactile cues required   HOME EXERCISE PROGRAM:  Access Code: [email or web address] URL: [email or web address] Date: 11/19/2024 Prepared by: Lonni Pall  Exercises - Clamshell with Resistance  - 1 x daily - 3-4 x weekly - 2-3 sets - 10-12 reps - Supine Bridge with Resistance Band  - 1 x daily - 3-4 x weekly - 2-3 sets - 10-12 reps - Side Stepping with Resistance at Ankles  - 1 x daily - 3-4 x weekly - 2-3 sets - 10 reps - Sidelying Hip Abduction with Resistance at Thighs  - 1 x daily - 3-4 x weekly - 2-3 sets - 10-12 reps - Supine Dead Bug with Leg Extension  - 1 x daily - 3-4 x weekly - 2-3 sets - 10 reps - Bird Dog  - 1 x daily - 3-4 x weekly - 2-3 sets - 10 reps - Curtsy Squat  - 1 x daily - 3-4 x weekly - 2-3 sets - 10 reps - Hip Hiking on Step  - 1 x daily - 3-4 x weekly - 2-3 sets - 10 reps - Standing ITB Stretch  - 2 x daily - 7 x weekly - 2-3 sets - 30s hold - Supine ITB Stretch with Strap  - 2 x daily - 7 x weekly - 2-3 sets - 30s hold - Supine Piriformis Stretch  - 2 x daily - 7 x weekly - 3 sets - 30-60 hold - Supine Gluteus Stretch  - 2 x daily - 7 x weekly - 3 sets - 30-60 hold - Supine Quadriceps Stretch with Strap on Table  - 2 x daily - 7 x weekly - 2-3 sets - 30s hold  Access Code: [email or web address] URL: [email or web address] Date: 11/11/2024 Prepared by: Lonni Pall  Exercises - Clamshell with Resistance  - 1 x daily - 3-4 x weekly - 2-3 sets - 10-12 reps - Supine Bridge with Resistance Band  - 1 x daily - 3-4 x weekly - 2-3 sets - 10-12 reps - Side Stepping with Resistance at Ankles  - 1 x daily - 3-4 x weekly - 2-3 sets - 10 reps - Sidelying Hip Abduction with Resistance at Thighs  - 1 x daily - 3-4 x weekly - 2-3 sets - 10-12 reps - Supine Dead Bug with  Leg Extension  - 1 x daily - 3-4 x weekly - 2-3 sets - 10 reps - Bird Dog  - 1 x daily - 3-4 x weekly - 2-3 sets - 10 reps - Supine Gluteus Stretch  - 2 x daily - 7 x weekly - 3 sets - 30-60 hold - Supine Piriformis Stretch  - 2 x daily - 7 x weekly - 3 sets - 30-60 hold - Supine ITB Stretch with Strap  - 2 x daily - 7 x weekly - 2-3 sets - 30s hold - Standing ITB Stretch  - 2 x daily - 7 x weekly - 2-3 sets - 30s hold - Supine Quadriceps Stretch with Strap on Table  - 2 x daily - 7 x weekly - 2-3 sets - 30s hold   ASSESSMENT:  CLINICAL IMPRESSION:  Continued PT POC focused in management of bilateral knee pain and L hip pain assosciated with IT band syndrome.Patient tolerated increased in intensity with hip exercises. Reviewed Curtsy lunge and hip drop exercises; good return demonstration from patient with minimal cues for proper  form. Patient tolerated wall sit with improvements in R knee pain however L knee pain persistent with knee flexion to 90 deg. Patient's hip pain continues to respond well to activity modification, adherence to HEP and introduction to gym protocol. PT will continue to increase intensity as tolerated and review progress towards goals as we reach end of POC. At this time she will continue to benefit from skilled PT in order to maximize return to PLOF and improve QoL.    OBJECTIVE IMPAIRMENTS: Abnormal gait, decreased activity tolerance, decreased endurance, difficulty walking, decreased strength, increased muscle spasms, impaired sensation, and pain.   ACTIVITY LIMITATIONS: lifting, bending, standing, squatting, sleeping, stairs, and transfers  PARTICIPATION LIMITATIONS: community activity, occupation, and yard work  PERSONAL FACTORS: Age, Fitness, Past/current experiences, Profession, and Time since onset of injury/illness/exacerbation are also affecting patient's functional outcome.   REHAB POTENTIAL: Good  CLINICAL DECISION MAKING: Evolving/moderate  complexity  EVALUATION COMPLEXITY: Moderate   GOALS: Goals reviewed with patient? No  SHORT TERM GOALS: Target date: 10/24/2024  Pt will be independent with HEP in order to improve strength and decrease hip pain to improve pain-free function at home and work. Baseline: 09/12/2024: Initial HEP provided; 11/06/2024: Routine Based HEP completely Ind.  Goal status: Goal Met   LONG TERM GOALS: Target date: 12/05/2024  Pt will increase LEFS by at least 9 points in order to demonstrate significant improvement in lower extremity function.  Baseline: 09/12/2024: 64/80; 11/06/2024: 69/80 - 86% (80/80 = no disability) Goal status: Progressing  2.  Pt will decrease worst hip pain by at least 3 points on the NPRS in order to demonstrate clinically significant reduction in hip pain. Baseline: 09/12/2024: 6/10; 11/06/2024: 3/10  Goal status: Goal Met  3.  Pt will be able to perform 10 squats with proper mechanics and minimal pain (< 3 NPS) in order to demonstrate significant improvements in function and strength for hobbies.     Baseline: 09/12/2024: 5 squats, moderate L knee pain (5/10); 11/06/2024: 10 Reps Completed Pain Free Goal status: Goal Met   4.  Pt will increase R/L LE strength by at least 1/2 MMT  (I.e. 4 > 4+) grade in order to demonstrate improvement in strength and function for hobbies and walking.  Baseline: 09/12/2024: see above; 11/06/2024:  LE MMT: MMT (out of 5) Right  Left  11/06/2024 R/L   Hip flexion 4- 4- 4+/4  Hip extension     Hip abduction 4 4-* 4+/4+ *  Hip adduction     Hip internal rotation 4 4 4+/4+  Hip external rotation 4 4 4+/4+  Knee flexion 4- 4- 4+/4+  Knee extension 4 3+ 4+/4+ *  Ankle dorsiflexion 5 5   Ankle plantarflexion 5 5    Goal status: Progressing  5. Pt will achieve 6-8 hours of sleep per night without report of awakening from hip pain in order to demonstrate significant improvements in pain.  Baseline: 09/12/2024: n/a; 10/09/2024: Pt  able to rest at least 7 hrs.; 11/06/2024: In the Last week, one night of waking from hip pain (6-8 hors achived)  Goal status: Goal Met    PLAN: PT FREQUENCY: 1-2x/week  PT DURATION: 12 weeks  PLANNED INTERVENTIONS: Therapeutic exercises, Therapeutic activity, Neuromuscular re-education, Balance training, Gait training, Joint mobilization, Electrical stimulation, Cryotherapy, Moist heat, Taping, Manual therapy, and Re-evaluation.  PLAN FOR NEXT SESSION: Review HEP, Progress hip strengthening, knee strengthening, hip stretches (IT band)    Lonni Pall PT, DPT Physical Therapist- [place]  11/19/2024, 12:19  PM  "

## 2024-11-20 ENCOUNTER — Ambulatory Visit (INDEPENDENT_AMBULATORY_CARE_PROVIDER_SITE_OTHER): Payer: PRIVATE HEALTH INSURANCE

## 2024-11-20 DIAGNOSIS — L700 Acne vulgaris: Secondary | ICD-10-CM | POA: Diagnosis not present

## 2024-11-20 NOTE — Progress Notes (Signed)
 Patient here today for a TheraClear session for acne vulgaris.    Treatment # 13 Treatment Area: Face and Neck Energy: 4 Vacuum: 2 Pulse: 750 Pulse Delay: double   Photos taken and placed in media.  Patient understands and agrees she has not taken any forms of Isotretinoin  within the last 4-6 months, she has not taken any Doxycycline  or used topical Retin-A .

## 2024-11-21 ENCOUNTER — Ambulatory Visit: Payer: PRIVATE HEALTH INSURANCE

## 2024-11-21 DIAGNOSIS — G8929 Other chronic pain: Secondary | ICD-10-CM

## 2024-11-21 DIAGNOSIS — M6281 Muscle weakness (generalized): Secondary | ICD-10-CM | POA: Diagnosis not present

## 2024-11-21 DIAGNOSIS — M25552 Pain in left hip: Secondary | ICD-10-CM

## 2024-11-21 NOTE — Therapy (Signed)
 " OUTPATIENT PHYSICAL THERAPY HIP/KNEE TREATMENT   Patient Name: Jeanne Velez MRN: [id number] DOB:1961/09/13, 64 y.o., female Today's Date: 11/21/2024  END OF SESSION:  PT End of Session - 11/21/24 1120     Visit Number 14    Number of Visits 25    Date for Recertification  12/05/24    PT Start Time 1118    PT Stop Time 1200    PT Time Calculation (min) 42 min    Activity Tolerance Patient tolerated treatment well    Behavior During Therapy [facility] for tasks assessed/performed           Past Medical History:  Diagnosis Date   Acne    Allergy    Anemia    Back pain    BRCA negative 07/2020   MyRisk neg   Cataracts, bilateral    Colon polyp    Constipation    Family history of breast cancer 07/2020   IBIS=15.1%/riskscore=6.7%   Family history of skin cancer 02/24/2022   Folliculitis    GERD (gastroesophageal reflux disease)    Hyperlipidemia    Hypothyroidism    Joint pain    Macular degeneration    Osteoarthritis    Retinal vasculitis    Stress    Thyroid cancer (HCC)    Past Surgical History:  Procedure Laterality Date   ABDOMINAL HYSTERECTOMY     BREAST BIOPSY Right    yrs ago benign, no marker   COLONOSCOPY WITH PROPOFOL  N/A 12/14/2022   Procedure: COLONOSCOPY WITH PROPOFOL ;  Surgeon: Unk Corinn Skiff, MD;  Location: [facility] ENDOSCOPY;  Service: Gastroenterology;  Laterality: N/A;   FLEXIBLE SIGMOIDOSCOPY N/A 04/28/2021   Procedure: FLEXIBLE SIGMOIDOSCOPY;  Surgeon: Unk Corinn Skiff, MD;  Location: [facility] ENDOSCOPY;  Service: Gastroenterology;  Laterality: N/A;   ovarial cystectomy     THYROIDECTOMY     TOTAL ABDOMINAL HYSTERECTOMY W/ BILATERAL SALPINGOOPHORECTOMY     dermoids, ovar cysts   TUBAL LIGATION     Patient Active Problem List   Diagnosis Date Noted   Greater 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 08/26/2024   It band syndrome, left 08/26/2024   Patellofemoral arthralgia of left knee 08/26/2024   Polyp of ascending colon 12/14/2022    Rectal polyp 12/14/2022   Genetic testing 04/12/2022   Family history of skin cancer 02/24/2022   Stress 12/14/2021   Pre-diabetes 06/28/2021   Vitamin D  deficiency 05/04/2021   Class 1 obesity with serious comorbidity and body mass index (BMI) of 33.0 to 33.9 in adult 05/04/2021   History of colonic polyps    Vasomotor symptoms due to menopause 07/28/2020   Hypothyroidism 07/24/2020   Mixed hyperlipidemia 07/24/2020   Family history of breast cancer 07/24/2020   Arthritis 07/24/2020   History of thyroid cancer 07/24/2020   Close exposure to COVID-19 virus 04/09/2020    PCP: Edman Marsa PARAS, DO  REFERRING PROVIDER: Alvia Selinda PARAS, MD  REFERRING DIAG:  [id number] (ICD-10-CM) - Greater 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  M76.32 (ICD-10-CM) - It band syndrome, left  M25.562 (ICD-10-CM) - Patellofemoral arthralgia of left knee    RATIONALE FOR EVALUATION AND TREATMENT: Rehabilitation  THERAPY DIAG: Muscle weakness (generalized)  Pain in left hip  Chronic pain of left knee  ONSET DATE: Chronic (> 5 Years)  FOLLOW-UP APPT SCHEDULED WITH REFERRING PROVIDER: Yes    SUBJECTIVE:  SUBJECTIVE STATEMENT:    Patient is a 64 year old female presenting to OPPT with a chief concern of Knee and Hip pain.   PERTINENT HISTORY:   [name] is a 64 year old female presenting with L hip and knee pain. Patient reports that her pain has been persistent for 5 years and she has previously seen a PT for the pain with moderate relief. She reports going through a weight loss journey. Last year she did a boot camp exercise class and following the course her L knee had some pain. 2 mos ago she was walking on a TM and she noticed her L hip had a popping sensation. She reports an aching  pain throughout the day. She states that the pain will wake her periodically and she has to straighten the leg in order to relieve the symptoms. She reports intermittent numbness/tingling in the L lower leg.  Prior to the recent exacerbation, she was very active and worked with a psychologist, educational 2x/week. She also participated in exercises at the gym 3-4 times a week. She does report if she isn't working she reports a sedentary lifestyle (sitting for about 8-10 hours)   She denies loss of b/b, saddle parasthesia, chills, fevers, nausea.  PAIN:    Pain Intensity: Present: 1-2/10, Best: 0/10, Worst: 5-6/10 Pain location: L Hip and L Knee Pain Quality: intermittent, constant, and aching  Radiating: Yes  Numbness/Tingling: Yes How long can you stand:  History of prior back or hip injury, pain, surgery, or therapy: Yes   PRECAUTIONS: Fall  WEIGHT BEARING RESTRICTIONS: No  FALLS: Has patient fallen in last 6 months? No  Living Environment Lives with: lives with their spouse Lives in: House/apartment Stairs: Yes: Internal: 16 steps; on right going up and External: 5-6 steps; can reach both Has following equipment at home: None  Prior level of function: Independent  Hobbies: Gardening, Outdoor woman, Exercises Classes, Pets, Reading   Patient Goals: I would like to reduce pain, increase strength, ROM and flexibility   OBJECTIVE:  Patient Surveys:  Cognition Patient is oriented to person, place, and time.  Recent memory is intact.  Attention span and concentration are intact.     Gross Musculoskeletal Assessment Tremor: None; Bulk: Normal, no muscle wasting noted; Tone: Normal; No visible step-off along spinal column, no signs of scoliosis, no gross anatomical hip deformities;   GAIT: Distance walked: 20 m  Assistive device utilized: None Level of assistance: Complete Independence Comments:   Posture: Lumbar lordosis: WNL Iliac crest height: Equal bilaterally Lumbar  lateral shift: Negative Leg length: Equal bilaterally;  AROM AROM (Normal range in degrees) AROM   Lumbar   Flexion (65) WNL   Extension (30) WNL   Right lateral flexion (25) WNL  Left lateral flexion (25) WNL  Right rotation (30) WNL  Left rotation (30) WNL      Hip Right Left  Flexion (125) WNL WNL  Extension (15)    Abduction (40)    Adduction (30)    Internal Rotation (45) [facility]  External Rotation (45) WFL WFL      Knee    Flexion (135) WNL WNL  Extension (0) WNL WNL      Ankle    Dorsiflexion (20)    Plantarflexion (50)    Inversion (35)    Eversion (15)    (* = pain; Blank rows = not tested)  LE MMT: MMT (out of 5) Right  Left   Hip flexion 4- 4-  Hip  extension    Hip abduction 4 4-*  Hip adduction    Hip internal rotation 4 4  Hip external rotation 4 4  Knee flexion 4- 4-  Knee extension 4 3+  Ankle dorsiflexion 5 5  Ankle plantarflexion 5 5  Ankle inversion    Ankle eversion    (* = pain; Blank rows = not tested)  Sensation Grossly intact to light touch throughout bilateral LEs as determined by testing dermatomes L2-S2. Proprioception, stereognosis, and hot/cold testing deferred on this date.  Reflexes R/L Knee Jerk (L3/4): 2+/2+    Muscle Length Hamstrings: R: Negative L: Negative  Palpation Location Right Left         Lumbar paraspinals  0  Quadratus Lumborum  0  Iliac Crest  0  ASIS  0  Pubic Rami    Pubic symphysis   Femoral Triangle    Inguinal ligament    Gluteus Maximus  2  Gluteus Medius  2  Deep hip external rotators  2  Sacrum 0  PSIS    Fortin's Area (SIJ)  0  Coccyx   IT BAND   2  Greater Trochanter  2  (Blank rows = not tested) Graded on 0-4 scale (0 = no pain, 1 = pain, 2 = pain with wincing/grimacing/flinching, 3 = pain with withdrawal, 4 = unwilling to allow palpation)  Passive Accessory Intervertebral Motion Deferred  Special Tests Lumbar Radiculopathy and Discogenic: SLR (SN 92, -LR 0.29): R: Negative  L:  Negative Crossed SLR (SP 90): R: Negative L: Negative  Facet Joint: Extension-Rotation (SN 100, -LR 0.0): R: Negative L: Negative  Hip: FABER (SN 81): R: Not Tested L: Negative FADIR (SN 94): R: Not Tested L: Negative  Functional Movement:   Sit To Stand: WNL Stairs: WNL  Squatting: 5 reps, Hip IR/ADD noted in LLE, less weight shift on LLE, narrow Bos. Pt reported moderate weakness in L knee following 5 repetitions   TODAY'S TREATMENT: DATE: 11/21/2024  Subjective: Patient reports improvements in hip pain. She reports that there is no pain in her L hip and continues to [employer] [place] to the gym without pain..  No question or concerns.  Therapeutic Exercise (with intent to increase core stabilization/hip flexor/abductor/extensor strength):  Knee Extension (0 - 60)   3 x 10 - 20#    Hamstring Curl   2 x 10 - 35   1 x 10 - 45#   Supine - LE Extended - Heels on Foam Roller   3 x 10   Hip Flexor Up and Over   2 x 10   1 x 10  Lock Clamshell   L: 3 x 10   Time spent reviewing recent updates to HEP and exercises added with barbell equipment   Therapeutic Activity (involving multiple muscle groups/parameters in order to achieve improved functional movement such as standing, walking, squatting):  NuStep - Pace Partner Mode -  L6-2 - 5 min - LE only - > 100 spm (Seat 10) for LE endurance and strength for capacity of walking; PT manually adjusted resistance throughout per patient's tolerance.  Barbell Landmine Squat  3  x 10 - 45# barbell   Barbell Landmine Press for glute and core coordination for lifting   R/L: 2 x 10 - 45# DB   PATIENT EDUCATION:  Education details: Exercise Technique, HEP  Person educated: Patient Education method: Explanation, Demonstration, and Handouts Education comprehension: verbalized understanding, returned demonstration, and tactile cues required   HOME EXERCISE PROGRAM:  Access Code:  [email or web address] URL: [email or web address] Date:  11/19/2024 Prepared by: Lonni Pall  Exercises - Clamshell with Resistance  - 1 x daily - 3-4 x weekly - 2-3 sets - 10-12 reps - Supine Bridge with Resistance Band  - 1 x daily - 3-4 x weekly - 2-3 sets - 10-12 reps - Side Stepping with Resistance at Ankles  - 1 x daily - 3-4 x weekly - 2-3 sets - 10 reps - Sidelying Hip Abduction with Resistance at Thighs  - 1 x daily - 3-4 x weekly - 2-3 sets - 10-12 reps - Supine Dead Bug with Leg Extension  - 1 x daily - 3-4 x weekly - 2-3 sets - 10 reps - Bird Dog  - 1 x daily - 3-4 x weekly - 2-3 sets - 10 reps - Curtsy Squat  - 1 x daily - 3-4 x weekly - 2-3 sets - 10 reps - Hip Hiking on Step  - 1 x daily - 3-4 x weekly - 2-3 sets - 10 reps - Standing ITB Stretch  - 2 x daily - 7 x weekly - 2-3 sets - 30s hold - Supine ITB Stretch with Strap  - 2 x daily - 7 x weekly - 2-3 sets - 30s hold - Supine Piriformis Stretch  - 2 x daily - 7 x weekly - 3 sets - 30-60 hold - Supine Gluteus Stretch  - 2 x daily - 7 x weekly - 3 sets - 30-60 hold - Supine Quadriceps Stretch with Strap on Table  - 2 x daily - 7 x weekly - 2-3 sets - 30s hold  Access Code: [email or web address] URL: [email or web address] Date: 11/11/2024 Prepared by: Lonni Pall  Exercises - Clamshell with Resistance  - 1 x daily - 3-4 x weekly - 2-3 sets - 10-12 reps - Supine Bridge with Resistance Band  - 1 x daily - 3-4 x weekly - 2-3 sets - 10-12 reps - Side Stepping with Resistance at Ankles  - 1 x daily - 3-4 x weekly - 2-3 sets - 10 reps - Sidelying Hip Abduction with Resistance at Thighs  - 1 x daily - 3-4 x weekly - 2-3 sets - 10-12 reps - Supine Dead Bug with Leg Extension  - 1 x daily - 3-4 x weekly - 2-3 sets - 10 reps - Bird Dog  - 1 x daily - 3-4 x weekly - 2-3 sets - 10 reps - Supine Gluteus Stretch  - 2 x daily - 7 x weekly - 3 sets - 30-60 hold - Supine Piriformis Stretch  - 2 x daily - 7 x weekly - 3 sets - 30-60 hold - Supine ITB Stretch with Strap  - 2 x daily - 7  x weekly - 2-3 sets - 30s hold - Standing ITB Stretch  - 2 x daily - 7 x weekly - 2-3 sets - 30s hold - Supine Quadriceps Stretch with Strap on Table  - 2 x daily - 7 x weekly - 2-3 sets - 30s hold   ASSESSMENT:  CLINICAL IMPRESSION: Continued PT POC focused in management of bilateral knee pain and L hip pain associated IT band syndrome. Patient's hip pain has completely resolved and she endorses increased walking endurance, squatting tolerance and no pain in her L hip. She has been including a gym routine along with PT appointments and no increase in pain in her bilateral knees or L hip. Patient still presenting with persistent pain in her L knee; PT suspects pain is associated  with degenerative changes to the joint due to her report of grinding and end range pain at 90 deg. Her R knee pain improves with isometric exercises and PT suspects patellar tendinopathy however she still endorses grinding sensation within that joint. Patient agreeable to discharge in following appt as she reaches end of POC. PT to review progress towards PT goals and prepare patient to discharge to gym/home program.     OBJECTIVE IMPAIRMENTS: Abnormal gait, decreased activity tolerance, decreased endurance, difficulty walking, decreased strength, increased muscle spasms, impaired sensation, and pain.   ACTIVITY LIMITATIONS: lifting, bending, standing, squatting, sleeping, stairs, and transfers  PARTICIPATION LIMITATIONS: community activity, occupation, and yard work  PERSONAL FACTORS: Age, Fitness, Past/current experiences, Profession, and Time since onset of injury/illness/exacerbation are also affecting patient's functional outcome.   REHAB POTENTIAL: Good  CLINICAL DECISION MAKING: Evolving/moderate complexity  EVALUATION COMPLEXITY: Moderate   GOALS: Goals reviewed with patient? No  SHORT TERM GOALS: Target date: 10/24/2024  Pt will be independent with HEP in order to improve strength and decrease hip  pain to improve pain-free function at home and work. Baseline: 09/12/2024: Initial HEP provided; 11/06/2024: Routine Based HEP completely Ind.  Goal status: Goal Met   LONG TERM GOALS: Target date: 12/05/2024  Pt will increase LEFS by at least 9 points in order to demonstrate significant improvement in lower extremity function.  Baseline: 09/12/2024: 64/80; 11/06/2024: 69/80 - 86% (80/80 = no disability) Goal status: Progressing  2.  Pt will decrease worst hip pain by at least 3 points on the NPRS in order to demonstrate clinically significant reduction in hip pain. Baseline: 09/12/2024: 6/10; 11/06/2024: 3/10  Goal status: Goal Met  3.  Pt will be able to perform 10 squats with proper mechanics and minimal pain (< 3 NPS) in order to demonstrate significant improvements in function and strength for hobbies.     Baseline: 09/12/2024: 5 squats, moderate L knee pain (5/10); 11/06/2024: 10 Reps Completed Pain Free Goal status: Goal Met   4.  Pt will increase R/L LE strength by at least 1/2 MMT  (I.e. 4 > 4+) grade in order to demonstrate improvement in strength and function for hobbies and walking.  Baseline: 09/12/2024: see above; 11/06/2024:  LE MMT: MMT (out of 5) Right  Left  11/06/2024 R/L   Hip flexion 4- 4- 4+/4  Hip extension     Hip abduction 4 4-* 4+/4+ *  Hip adduction     Hip internal rotation 4 4 4+/4+  Hip external rotation 4 4 4+/4+  Knee flexion 4- 4- 4+/4+  Knee extension 4 3+ 4+/4+ *  Ankle dorsiflexion 5 5   Ankle plantarflexion 5 5    Goal status: Progressing  5. Pt will achieve 6-8 hours of sleep per night without report of awakening from hip pain in order to demonstrate significant improvements in pain.  Baseline: 09/12/2024: n/a; 10/09/2024: Pt able to rest at least 7 hrs.; 11/06/2024: In the Last week, one night of waking from hip pain (6-8 hors achived)  Goal status: Goal Met    PLAN: PT FREQUENCY: 1-2x/week  PT DURATION: 12 weeks  PLANNED  INTERVENTIONS: Therapeutic exercises, Therapeutic activity, Neuromuscular re-education, Balance training, Gait training, Joint mobilization, Electrical stimulation, Cryotherapy, Moist heat, Taping, Manual therapy, and Re-evaluation.  PLAN FOR NEXT SESSION: Review HEP, Progress hip strengthening, knee strengthening, hip stretches (IT band)    Lonni Pall PT, DPT Physical Therapist- [place]  11/21/2024, 1:19 PM  "

## 2024-12-03 ENCOUNTER — Other Ambulatory Visit: Payer: Self-pay

## 2024-12-03 DIAGNOSIS — Z79890 Hormone replacement therapy (postmenopausal): Secondary | ICD-10-CM

## 2024-12-03 DIAGNOSIS — N951 Menopausal and female climacteric states: Secondary | ICD-10-CM

## 2024-12-03 MED ORDER — ESTRADIOL 0.5 MG PO TABS: 0.5000 mg | ORAL_TABLET | Freq: Every day | ORAL | 0 refills | Status: DC

## 2024-12-03 NOTE — Telephone Encounter (Signed)
 Refill of Estradiol  until pt can see Alicia Copland PA-C 12/19/24.

## 2024-12-04 ENCOUNTER — Ambulatory Visit: Payer: PRIVATE HEALTH INSURANCE

## 2024-12-04 DIAGNOSIS — L700 Acne vulgaris: Secondary | ICD-10-CM | POA: Diagnosis not present

## 2024-12-04 NOTE — Progress Notes (Signed)
 Patient here today for a TheraClear session for acne vulgaris.    Treatment # 14 Treatment Area: Face and Neck Energy: 4 Vacuum: 2 Pulse: 750 Pulse Delay: double   Photos taken and placed in media.  Patient understands and agrees she has not taken any forms of Isotretinoin  within the last 4-6 months, she has not taken any Doxycycline  or used topical Retin-A .    Alan Pizza, RMA

## 2024-12-05 ENCOUNTER — Ambulatory Visit: Payer: PRIVATE HEALTH INSURANCE

## 2024-12-05 DIAGNOSIS — M25552 Pain in left hip: Secondary | ICD-10-CM

## 2024-12-05 DIAGNOSIS — M6281 Muscle weakness (generalized): Secondary | ICD-10-CM

## 2024-12-05 DIAGNOSIS — R2689 Other abnormalities of gait and mobility: Secondary | ICD-10-CM

## 2024-12-05 DIAGNOSIS — G8929 Other chronic pain: Secondary | ICD-10-CM

## 2024-12-05 NOTE — Therapy (Signed)
 " OUTPATIENT PHYSICAL THERAPY HIP/KNEE TREATMENT/DISCHARGE SUMMARY   Patient Name: Jeanne Velez MRN: [id number] DOB:1961-02-15, 64 y.o., female Today's Date: 12/05/2024  END OF SESSION:     Past Medical History:  Diagnosis Date   Acne    Allergy    Anemia    Back pain    BRCA negative 07/2020   MyRisk neg   Cataracts, bilateral    Colon polyp    Constipation    Family history of breast cancer 07/2020   IBIS=15.1%/riskscore=6.7%   Family history of skin cancer 02/24/2022   Folliculitis    GERD (gastroesophageal reflux disease)    Hyperlipidemia    Hypothyroidism    Joint pain    Macular degeneration    Osteoarthritis    Retinal vasculitis    Stress    Thyroid cancer (HCC)    Past Surgical History:  Procedure Laterality Date   ABDOMINAL HYSTERECTOMY     BREAST BIOPSY Right    yrs ago benign, no marker   COLONOSCOPY WITH PROPOFOL  N/A 12/14/2022   Procedure: COLONOSCOPY WITH PROPOFOL ;  Surgeon: Unk Corinn Skiff, MD;  Location: [facility] ENDOSCOPY;  Service: Gastroenterology;  Laterality: N/A;   FLEXIBLE SIGMOIDOSCOPY N/A 04/28/2021   Procedure: FLEXIBLE SIGMOIDOSCOPY;  Surgeon: Unk Corinn Skiff, MD;  Location: [facility] ENDOSCOPY;  Service: Gastroenterology;  Laterality: N/A;   ovarial cystectomy     THYROIDECTOMY     TOTAL ABDOMINAL HYSTERECTOMY W/ BILATERAL SALPINGOOPHORECTOMY     dermoids, ovar cysts   TUBAL LIGATION     Patient Active Problem List   Diagnosis Date Noted   Greater 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 08/26/2024   It band syndrome, left 08/26/2024   Patellofemoral arthralgia of left knee 08/26/2024   Polyp of ascending colon 12/14/2022   Rectal polyp 12/14/2022   Genetic testing 04/12/2022   Family history of skin cancer 02/24/2022   Stress 12/14/2021   Pre-diabetes 06/28/2021   Vitamin D  deficiency 05/04/2021   Class 1 obesity with serious comorbidity and body mass index (BMI) of 33.0 to 33.9 in adult 05/04/2021   History of  colonic polyps    Vasomotor symptoms due to menopause 07/28/2020   Hypothyroidism 07/24/2020   Mixed hyperlipidemia 07/24/2020   Family history of breast cancer 07/24/2020   Arthritis 07/24/2020   History of thyroid cancer 07/24/2020   Close exposure to COVID-19 virus 04/09/2020    PCP: Edman Marsa PARAS, DO  REFERRING PROVIDER: Alvia Selinda PARAS, MD  REFERRING DIAG:  [id number] (ICD-10-CM) - Greater 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  M76.32 (ICD-10-CM) - It band syndrome, left  M25.562 (ICD-10-CM) - Patellofemoral arthralgia of left knee    RATIONALE FOR EVALUATION AND TREATMENT: Rehabilitation  THERAPY DIAG: No diagnosis found.  ONSET DATE: Chronic (> 5 Years)  FOLLOW-UP APPT SCHEDULED WITH REFERRING PROVIDER: Yes    SUBJECTIVE:  SUBJECTIVE STATEMENT:    Patient is a 64 year old female presenting to OPPT with a chief concern of Knee and Hip pain.   PERTINENT HISTORY:   Jeanne Velez is a 64 year old female presenting with L hip and knee pain. Patient reports that her pain has been persistent for 5 years and she has previously seen a PT for the pain with moderate relief. She reports going through a weight loss journey. Last year she did a boot camp exercise class and following the course her L knee had some pain. 2 mos ago she was walking on a TM and she noticed her L hip had a popping sensation. She reports an aching pain throughout the day. She states that the pain will wake her periodically and she has to straighten the leg in order to relieve the symptoms. She reports intermittent numbness/tingling in the L lower leg.  Prior to the recent exacerbation, she was very active and worked with a psychologist, educational 2x/week. She also participated in exercises at the gym 3-4 times a week.  She does report if she isn't working she reports a sedentary lifestyle (sitting for about 8-10 hours)   She denies loss of b/b, saddle parasthesia, chills, fevers, nausea.  PAIN:    Pain Intensity: Present: 1-2/10, Best: 0/10, Worst: 5-6/10 Pain location: L Hip and L Knee Pain Quality: intermittent, constant, and aching  Radiating: Yes  Numbness/Tingling: Yes How long can you stand:  History of prior back or hip injury, pain, surgery, or therapy: Yes   PRECAUTIONS: Fall  WEIGHT BEARING RESTRICTIONS: No  FALLS: Has patient fallen in last 6 months? No  Living Environment Lives with: lives with their spouse Lives in: House/apartment Stairs: Yes: Internal: 16 steps; on right going up and External: 5-6 steps; can reach both Has following equipment at home: None  Prior level of function: Independent  Hobbies: Gardening, Outdoor woman, Exercises Classes, Pets, Reading   Patient Goals: I would like to reduce pain, increase strength, ROM and flexibility   OBJECTIVE:  Patient Surveys:  Cognition Patient is oriented to person, place, and time.  Recent memory is intact.  Attention span and concentration are intact.     Gross Musculoskeletal Assessment Tremor: None; Bulk: Normal, no muscle wasting noted; Tone: Normal; No visible step-off along spinal column, no signs of scoliosis, no gross anatomical hip deformities;   GAIT: Distance walked: 20 m  Assistive device utilized: None Level of assistance: Complete Independence Comments:   Posture: Lumbar lordosis: WNL Iliac crest height: Equal bilaterally Lumbar lateral shift: Negative Leg length: Equal bilaterally;  AROM AROM (Normal range in degrees) AROM   Lumbar   Flexion (65) WNL   Extension (30) WNL   Right lateral flexion (25) WNL  Left lateral flexion (25) WNL  Right rotation (30) WNL  Left rotation (30) WNL      Hip Right Left  Flexion (125) WNL WNL  Extension (15)    Abduction (40)    Adduction (30)     Internal Rotation (45) [facility]  External Rotation (45) WFL WFL      Knee    Flexion (135) WNL WNL  Extension (0) WNL WNL      Ankle    Dorsiflexion (20)    Plantarflexion (50)    Inversion (35)    Eversion (15)    (* = pain; Blank rows = not tested)  LE MMT: MMT (out of 5) Right  Left   Hip flexion 4- 4-  Hip  extension    Hip abduction 4 4-*  Hip adduction    Hip internal rotation 4 4  Hip external rotation 4 4  Knee flexion 4- 4-  Knee extension 4 3+  Ankle dorsiflexion 5 5  Ankle plantarflexion 5 5  Ankle inversion    Ankle eversion    (* = pain; Blank rows = not tested)  Sensation Grossly intact to light touch throughout bilateral LEs as determined by testing dermatomes L2-S2. Proprioception, stereognosis, and hot/cold testing deferred on this date.  Reflexes R/L Knee Jerk (L3/4): 2+/2+    Muscle Length Hamstrings: R: Negative L: Negative  Palpation Location Right Left         Lumbar paraspinals  0  Quadratus Lumborum  0  Iliac Crest  0  ASIS  0  Pubic Rami    Pubic symphysis   Femoral Triangle    Inguinal ligament    Gluteus Maximus  2  Gluteus Medius  2  Deep hip external rotators  2  Sacrum 0  PSIS    Fortin's Area (SIJ)  0  Coccyx   IT BAND   2  Greater Trochanter  2  (Blank rows = not tested) Graded on 0-4 scale (0 = no pain, 1 = pain, 2 = pain with wincing/grimacing/flinching, 3 = pain with withdrawal, 4 = unwilling to allow palpation)  Passive Accessory Intervertebral Motion Deferred  Special Tests Lumbar Radiculopathy and Discogenic: SLR (SN 92, -LR 0.29): R: Negative L:  Negative Crossed SLR (SP 90): R: Negative L: Negative  Facet Joint: Extension-Rotation (SN 100, -LR 0.0): R: Negative L: Negative  Hip: FABER (SN 81): R: Not Tested L: Negative FADIR (SN 94): R: Not Tested L: Negative  Functional Movement:   Sit To Stand: WNL Stairs: WNL  Squatting: 5 reps, Hip IR/ADD noted in LLE, less weight shift on LLE, narrow  Bos. Pt reported moderate weakness in L knee following 5 repetitions   TODAY'S TREATMENT: DATE: 12/05/24  Subjective: Patient agreeable to discharge in today's . No pain prior to today's session. No question or concerns.  Therapeutic Exercise (with intent to increase core stabilization/hip flexor/abductor/extensor strength):  Knee Extension (0 - 60)   2 x 10 - 20#   1 x 10 - 25#    Hamstring Curl   1 x 10 - 20#   2 x 10 - 35#   Hip Flexor Up and Over - hand sanitizer bottle   3 x 10 ea leg   Supine 90/90 with OH Reach and Resisted Hip Flexion   1 x 10 - Red TB   2 x 10 - Red TB around feet   Resisted Bird Dog   2 x 10 - Red TB with opposite limbs   Therapeutic Activity (involving multiple muscle groups/parameters in order to achieve improved functional movement such as standing, walking, squatting):  NuStep - Pace Partner Mode -  L6-2 - 5 min - LE only - > 100 spm (Seat 10) for LE endurance and strength for capacity of walking; PT manually adjusted resistance throughout per patient's tolerance.  Kettlebell Squat   1 x 10 - 20# KB  2 x 10 - 30# KB - no pain in the L knee/hip   Curtsy Lunge   1 x 10 ea leg    PATIENT EDUCATION:  Education details: Exercise Technique, HEP  Person educated: Patient Education method: Explanation, Demonstration, and Handouts Education comprehension: verbalized understanding, returned demonstration, and tactile cues required   HOME EXERCISE PROGRAM:  (  Exercises) Access Code: [email or web address] URL: [email or web address] Date: 12/05/2024 Prepared by: Lonni Pall  Exercises - Clamshell with Resistance  - 1 x daily - 3-4 x weekly - 2-3 sets - 10-12 reps - Supine Bridge with Resistance Band  - 1 x daily - 3-4 x weekly - 2-3 sets - 10-12 reps - Side Stepping with Resistance at Ankles  - 1 x daily - 3-4 x weekly - 2-3 sets - 10 reps - Sidelying Hip Abduction with Resistance at Thighs  - 1 x daily - 3-4 x weekly - 2-3 sets - 10-12  reps - Curtsy Squat  - 1 x daily - 3-4 x weekly - 2-3 sets - 10 reps - Hip Hiking on Step  - 1 x daily - 3-4 x weekly - 2-3 sets - 10 reps - Supine 90/90 with Shoulder Flexion and Leg Extension  - 1 x daily - 3-4 x weekly - 2-3 sets - 10-12 reps - Bird Dog  - 1 x daily - 3-4 x weekly - 2-3 sets - 10 reps - Barbell Landmine Press  - 1 x daily - 3-4 x weekly - 2-3 sets - 10-12 reps  (Stretch) Access Code: [email or web address] URL: [email or web address] Date: 12/05/2024 Prepared by: Lonni Pall  Exercises - Standing ITB Stretch  - 2 x daily - 7 x weekly - 2-3 sets - 30s hold - Supine ITB Stretch with Strap  - 1 x daily - 7 x weekly - 2-3 sets - 30s hold - Supine Piriformis Stretch  - 1 x daily - 7 x weekly - 2-3 sets - 30s hold - Supine Gluteus Stretch  - 1 x daily - 7 x weekly - 2-3 sets - 30s hold - Supine Quadriceps Stretch with Strap on Table  - 1 x daily - 7 x weekly - 2-3 sets - 30s hold   ASSESSMENT:  CLINICAL IMPRESSION: Lavaughn Bisig is a 64 y.o. female seen for L hip pain relative an IT band. Pt reached end of POC and is agreeable to discharge to home with HEP. Pt has met all remaining goals and demonstrates improvements in LE strength, pain and disability based on PT goals (see below). The patient has achieved independence with daily activities and is able to perform exercises with proper technique. Additionally she has started attending a gym protocol with her spouse. She still has persistent L knee pain which she will speak with her physician regarding differential between osteoarthritis or patellar tendinopathy. They no longer require skilled physical therapy interventions and are discharged with a home exercise program to maintain progress. No question at end of session. Follow-up with their primary care provider is recommended if any issues arise.   OBJECTIVE IMPAIRMENTS: Abnormal gait, decreased activity tolerance, decreased endurance, difficulty walking, decreased  strength, increased muscle spasms, impaired sensation, and pain.   ACTIVITY LIMITATIONS: lifting, bending, standing, squatting, sleeping, stairs, and transfers  PARTICIPATION LIMITATIONS: community activity, occupation, and yard work  PERSONAL FACTORS: Age, Fitness, Past/current experiences, Profession, and Time since onset of injury/illness/exacerbation are also affecting patient's functional outcome.   REHAB POTENTIAL: Good  CLINICAL DECISION MAKING: Evolving/moderate complexity  EVALUATION COMPLEXITY: Moderate   GOALS: Goals reviewed with patient? No  SHORT TERM GOALS: Target date: 10/24/2024  Pt will be independent with HEP in order to improve strength and decrease hip pain to improve pain-free function at home and work. Baseline: 09/12/2024: Initial HEP provided; 11/06/2024: Routine Based HEP completely Ind.  Goal status: Goal Met   LONG TERM GOALS: Target date: 12/05/2024  Pt will increase LEFS by at least 9 points in order to demonstrate significant improvement in lower extremity function.  Baseline: 09/12/2024: 64/80; 11/06/2024: 69/80 - 86% (80/80 = no disability); 12/05/2024: 73/80 (80/80 = no disability) Goal status: Progressing  2.  Pt will decrease worst hip pain by at least 3 points on the NPRS in order to demonstrate clinically significant reduction in hip pain. Baseline: 09/12/2024: 6/10; 11/06/2024: 3/10; 12/05/2024: 2-3/10 Goal status: Goal Met  3.  Pt will be able to perform 10 squats with proper mechanics and minimal pain (< 3 NPS) in order to demonstrate significant improvements in function and strength for hobbies.     Baseline: 09/12/2024: 5 squats, moderate L knee pain (5/10); 11/06/2024: 10 Reps Completed Pain Free Goal status: Goal Met   4.  Pt will increase R/L LE strength by at least 1/2 MMT  (I.e. 4 > 4+) grade in order to demonstrate improvement in strength and function for hobbies and walking.  Baseline: 09/12/2024: see above; 11/06/2024:  LE  MMT: MMT (out of 5) Right  Left  11/06/2024 R/L  12/06/2023:  R/L   Hip flexion 4- 4- 4+/4 5/5  Hip extension      Hip abduction 4 4-* 4+/4+ * 5/5  Hip adduction      Hip internal rotation 4 4 4+/4+ 5/5  Hip external rotation 4 4 4+/4+ 5/5  Knee flexion 4- 4- 4+/4+ 5/5  Knee extension 4 3+ 4+/4+ * 5/4+  Ankle dorsiflexion 5 5    Ankle plantarflexion 5 5     Goal status: Progressing  5. Pt will achieve 6-8 hours of sleep per night without report of awakening from hip pain in order to demonstrate significant improvements in pain.  Baseline: 09/12/2024: n/a; 10/09/2024: Pt able to rest at least 7 hrs.; 11/06/2024: In the Last week, one night of waking from hip pain (6-8 hors achived); 12/05/2024: 6-8  of sleep without hip pain awaking sleep cycle  Goal status: Goal Met    PLAN: PT FREQUENCY: 1-2x/week  PT DURATION: 12 weeks  PLANNED INTERVENTIONS: Discharge  PLAN FOR NEXT SESSION: Discharge   Lonni Pall PT, DPT Physical Therapist-   12/05/2024, 9:09 AM  "

## 2024-12-06 ENCOUNTER — Ambulatory Visit
Admission: RE | Admit: 2024-12-06 | Discharge: 2024-12-06 | Disposition: A | Payer: PRIVATE HEALTH INSURANCE | Source: Ambulatory Visit | Attending: Family Medicine

## 2024-12-06 ENCOUNTER — Ambulatory Visit
Admission: RE | Admit: 2024-12-06 | Discharge: 2024-12-06 | Disposition: A | Payer: PRIVATE HEALTH INSURANCE | Attending: Family Medicine | Admitting: Family Medicine

## 2024-12-06 ENCOUNTER — Ambulatory Visit: Payer: PRIVATE HEALTH INSURANCE | Admitting: Family Medicine

## 2024-12-06 ENCOUNTER — Encounter: Payer: Self-pay | Admitting: Family Medicine

## 2024-12-06 VITALS — BP 114/62 | HR 92 | Ht 68.0 in | Wt 181.0 lb

## 2024-12-06 DIAGNOSIS — M25561 Pain in right knee: Secondary | ICD-10-CM

## 2024-12-06 NOTE — Patient Instructions (Signed)
" °  VISIT SUMMARY: During your visit, we discussed your persistent bilateral knee pain and swelling, as well as your history of left hip pain. We reviewed your progress with physical therapy and discussed further treatment options for your knee osteoarthritis.  YOUR PLAN: BILATERAL KNEE OSTEOARTHRITIS: Chronic bilateral knee pain due to patellofemoral cartilage degeneration and dynamic patellar maltracking. Left knee osteoarthritis confirmed radiographically; right knee imaging pending. -We ordered an x-ray for your right knee to evaluate for osteoarthritis. -We initiated insurance authorization for viscosupplementation for both knees, contingent on imaging and insurance approval. -We discussed the option of corticosteroid injections for short-term pain control. You may contact the clinic if you wish to proceed with this. -Continue taking ibuprofen as needed. Consider corticosteroid injections if regular use becomes necessary. -You are referred to physical therapy for both knees, with the option to include your hip as needed. Communicate your specific rehabilitation goals to your therapist. -Use a knee brace for interim support, but focus on rehabilitation to address patellar tracking and mechanics. -Report any worsening or unmanageable pain before your next follow-up. -We will contact you with the results of your x-ray and insurance authorization.  HISTORY OF LEFT HIP PAIN: Your left hip pain has significantly improved with physical therapy and is not currently limiting. -Your left hip is included in the physical therapy referral for continuity, with the primary focus on knee rehabilitation unless hip symptoms recur. -Continue your daily stretching and symptom-based rehabilitation exercises.    Contains text generated by Abridge.   "

## 2024-12-06 NOTE — Progress Notes (Signed)
 "    Primary Care / Sports Medicine Office Visit  Patient Information:  Patient ID: Jeanne Velez, female DOB: 16-Jul-1961 Age: 64 y.o. MRN: [id number]   Jeanne Velez is a pleasant 64 y.o. female presenting with the following:  Chief Complaint  Patient presents with   Hip Pain    Left hip has improved.   Knee Pain    Left knee pain and swelling under knee cap and on lateral aspect. Pain has been off and on since her injury in boot camp 2 years ago. Right knee pain directly in the middle of knee. Aggravating factors going from sit to stand, when knees bent at 90 degree angle she has pain. When legs are extended pain is less. Patient would like to discuss treatment options for knees.     Vitals:   12/06/24 0837  BP: 114/62  Pulse: 92  SpO2: 99%   Vitals:   12/06/24 0837  Weight: 181 lb (82.1 kg)  Height: 5' 8 (1.727 m)   Body mass index is 27.52 kg/m.  No results found.   Independent interpretation of notes and tests performed by another provider:   None  Procedures performed:   None  Pertinent History, Exam, Impression, and Recommendations:   Discussed the use of AI scribe software for clinical note transcription with the patient, who gave verbal consent to proceed.  History of Present Illness   Jeanne Velez is a 64 year old female with bilateral knee osteoarthritis who presents for follow-up of persistent bilateral knee pain.  Bilateral Knee Pain and Swelling: - Persistent bilateral knee pain and swelling since an injury during boot camp approximately two years ago - Initial x-rays performed in 2024 at the time of injury - Left knee was initially more symptomatic; currently both knees are affected - Left knee pain localized under the patella; right knee pain centered over the middle of the patella - Swelling most notable after physical therapy or workouts - Audible crepitus in the right knee during activities such as curtsy squats, associated with mild pain - Both  knees aggravated by deep flexion or bending, especially when initiating leg extensions from 90 degrees - Unable to perform squats or deep knee bends without significant discomfort - Right knee pain improves somewhat with warming up; left knee pain remains unchanged with activity - Pivoting or twisting does not significantly exacerbate symptoms - Able to perform leg extensions if not starting from deep flexion - No locking, catching, or instability  Pain Management and Rehabilitation: - Uses ibuprofen as needed for pain control - History of intolerance to naproxen and other NSAIDs - Completed physical therapy, meeting 90-95% of goals - Increased strength and flexibility, but increased activity has led to more knee symptoms - Continues daily stretching, including IT band stretches - Works out with a trainer  Left Hip Pain: - Longstanding history of left hip pain for 5-10 years - Significant improvement with physical therapy - Able to jog on a treadmill for three minutes without pain, catching, or popping - Improved sleep and quality of life due to resolution of hip pain - Continues daily stretches and symptom-based rehabilitation for the hip     Physical Exam  RIGHT KNEE INSPECTION: No deformity, erythema, or effusion. PALPATION: Focal tenderness at the medial and lateral joint lines. Tenderness at the quadriceps tendon insertion, patellar tendon, and Hoffas fat pad. Medial and lateral patellar facets non-tender. No warmth or crepitus. RANGE OF MOTION: 0-133 degrees without mechanical obstruction. STRENGTH: Quadriceps strength preserved.  SPECIAL TESTS: Negative medial and lateral McMurrays tests. Dynamic patellar maltracking noted with functional movement.  LEFT KNEE INSPECTION: No deformity, swelling, or erythema. PALPATION: Medial and lateral patellar facets non-tender. No joint line tenderness. No tenderness at quadriceps or patellar tendons. RANGE OF MOTION: 0-130  degrees. STRENGTH: Quadriceps strength preserved. SPECIAL TESTS: No dynamic patellar maltracking. McMurrays testing negative.   Assessment and Plan    Bilateral knee osteoarthritis Chronic bilateral knee pain due to patellofemoral cartilage degeneration and dynamic patellar maltracking. Left knee osteoarthritis confirmed radiographically; right knee imaging pending. Pain limits physical therapy progress. Discussed viscosupplementation and corticosteroid injection options. - Ordered right knee x-ray to evaluate for osteoarthritis. - [employer] authorization for viscosupplementation for both knees, contingent on imaging and insurance approval. - Discussed corticosteroid injection for short-term pain control; advised she may contact clinic if desired. - Advised continuation of ibuprofen as needed, with consideration for corticosteroid injection if regular use becomes necessary. - Referred to physical therapy for both knees, with option to include hip as needed; encouraged communication of specific rehabilitation goals to therapist. - Provided anticipatory guidance on knee brace use as interim support, emphasizing need for rehabilitation to address patellar tracking and mechanics. - Advised to report worsening or unmanageable pain prior to next follow-up. - Will contact her with x-ray and insurance authorization results.  History of left hip pain Left hip pain significantly improved with physical therapy. Able to jog with minimal symptoms. Hip not currently limiting. - Included left hip in physical therapy referral for continuity, with primary focus on knee rehabilitation unless hip symptoms recur. - Encouraged continuation of daily stretching and symptom-based rehabilitation exercises.        Assessment & Plan Patellofemoral arthralgia of right knee  Orders:   DG Knee Complete 4 Views Right; Future   No follow-ups on file.     Selinda JINNY Ku, MD, [facility]   Primary Care  Sports Medicine Primary Care and Sports Medicine at [facility]   "

## 2024-12-10 ENCOUNTER — Ambulatory Visit: Payer: Self-pay | Admitting: Family Medicine

## 2024-12-11 ENCOUNTER — Other Ambulatory Visit: Payer: Self-pay

## 2024-12-11 DIAGNOSIS — M25561 Pain in right knee: Secondary | ICD-10-CM

## 2024-12-11 DIAGNOSIS — M25562 Pain in left knee: Secondary | ICD-10-CM

## 2024-12-11 DIAGNOSIS — M25552 Pain in left hip: Secondary | ICD-10-CM

## 2024-12-16 ENCOUNTER — Ambulatory Visit: Payer: PRIVATE HEALTH INSURANCE

## 2024-12-18 NOTE — Therapy (Incomplete)
 " OUTPATIENT PHYSICAL THERAPY KNEE EVALUATION/TREATMENT  Patient Name: Jeanne Velez MRN: [id number] DOB:1961/05/22, 64 y.o., female Today's Date: 12/19/2024  END OF SESSION:  PT End of Session - 12/19/24 1601     Visit Number 1    Number of Visits 17    Date for Recertification  02/13/25    PT Start Time 1602    PT Stop Time 1645    PT Time Calculation (min) 43 min    Activity Tolerance Patient tolerated treatment well    Behavior During Therapy [facility] for tasks assessed/performed          Past Medical History:  Diagnosis Date   Acne    Allergy    Anemia    Back pain    BRCA negative 07/2020   MyRisk neg   Cataracts, bilateral    Colon polyp    Constipation    Family history of breast cancer 07/2020   IBIS=15.1%/riskscore=6.7%   Family history of skin cancer 02/24/2022   Folliculitis    GERD (gastroesophageal reflux disease)    Hyperlipidemia    Hypothyroidism    Joint pain    Macular degeneration    Osteoarthritis    Retinal vasculitis    Stress    Thyroid cancer (HCC)    Past Surgical History:  Procedure Laterality Date   ABDOMINAL HYSTERECTOMY     BREAST BIOPSY Right    yrs ago benign, no marker   COLONOSCOPY WITH PROPOFOL  N/A 12/14/2022   Procedure: COLONOSCOPY WITH PROPOFOL ;  Surgeon: Unk Corinn Skiff, MD;  Location: [facility] ENDOSCOPY;  Service: Gastroenterology;  Laterality: N/A;   FLEXIBLE SIGMOIDOSCOPY N/A 04/28/2021   Procedure: FLEXIBLE SIGMOIDOSCOPY;  Surgeon: Unk Corinn Skiff, MD;  Location: [facility] ENDOSCOPY;  Service: Gastroenterology;  Laterality: N/A;   ovarial cystectomy     THYROIDECTOMY     TOTAL ABDOMINAL HYSTERECTOMY W/ BILATERAL SALPINGOOPHORECTOMY     dermoids, ovar cysts   TUBAL LIGATION     Patient Active Problem List   Diagnosis Date Noted   Greater 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 08/26/2024   It band syndrome, left 08/26/2024   Patellofemoral arthralgia of left knee 08/26/2024   Polyp of ascending colon  12/14/2022   Rectal polyp 12/14/2022   Genetic testing 04/12/2022   Family history of skin cancer 02/24/2022   Stress 12/14/2021   Pre-diabetes 06/28/2021   Vitamin D  deficiency 05/04/2021   Class 1 obesity with serious comorbidity and body mass index (BMI) of 33.0 to 33.9 in adult 05/04/2021   History of colonic polyps    Vasomotor symptoms due to menopause 07/28/2020   Hypothyroidism 07/24/2020   Mixed hyperlipidemia 07/24/2020   Family history of breast cancer 07/24/2020   Arthritis 07/24/2020   History of thyroid cancer 07/24/2020   Close exposure to COVID-19 virus 04/09/2020    PCP: Edman Marsa PARAS, DO  REFERRING PROVIDER: Alvia Selinda PARAS, MD  REFERRING DIAG:  [phone] (ICD-10-CM) - Patellofemoral arthralgia of right knee  M25.552 (ICD-10-CM) - Greater 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  M25.562 (ICD-10-CM) - Patellofemoral arthralgia of left knee    RATIONALE FOR EVALUATION AND TREATMENT: Rehabilitation  THERAPY DIAG: Chronic pain of left knee - Plan: PT plan of care cert/re-cert  Muscle weakness (generalized)  Patellofemoral arthralgia of right knee  Greater 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  Patellofemoral arthralgia of left knee  ONSET DATE: Chronic   FOLLOW-UP APPT SCHEDULED WITH REFERRING PROVIDER: Yes    SUBJECTIVE:  SUBJECTIVE STATEMENT:    Patient reports to OPPT with a chief concern of bilateral knee pain and stiffness.   PERTINENT HISTORY:   Jeanne Velez is a 64 year old female arriving to OPPT with bilateral knee pain and stiffness. Pt previously seen for L hip pain secondary to IT band syndrome/GTPS. Hip has significantly resolved since PT POC and adherence to the gym. Pt reports that her L  knee pain has been bothering  her since attending a boot camp class 2 years ago. Pt reports swelling most notable after physical therapy or gym workouts. Audible crepitus noted in the R knee with squatting and is associated with mild pain.   Pt reports that she cannot perform deep squat > 90 deg due to pain both knees. Additionally leg extensions from 90 deg with pain however no pain noted with leg extensions above 60 degrees. She is physically active and attends the gym routinely with spouse. Patient able to jog on treadmill for three minutes without pain in the hip, catching or popping a the gym.   She reports visit with MD included updated imaging for the R knee. 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 Plan to include treatment focus towards bilateral knee with continuity of care to the L hip if symptoms reoccur. Patient plans to visit with Dr. Alvia to receive viscogel treatment following insurance approval.   She denies locking, catching, instability, numbness/tingling.   Imaging: (Per Chart Review):   Left Knee  CLINICAL DATA:  Acute on chronic left knee pain   EXAM: LEFT KNEE - COMPLETE 4+ VIEW   COMPARISON:  None Available.   FINDINGS: Normal alignment. No acute fracture or dislocation. Minimal medial and lateral patellofemoral compartment degenerative arthritis with tiny osteophyte formation. Joint spaces are preserved. No effusion. Soft tissues are unremarkable.   IMPRESSION: 1. Minimal degenerative arthritis.     Electronically Signed   By: Dorethia Molt M.D.   On: 12/27/2022 23:24  EXAM: 4 VIEW(S) XRAY OF THE RIGHT KNEE 12/06/2024 09:42:25 AM   COMPARISON: None available.   CLINICAL HISTORY: Evaluation of patellofemoral chronic pain.   FINDINGS:   BONES AND JOINTS: No acute fracture. No malalignment. Mild medial and patellofemoral compartment degenerative arthritis. No significant joint effusion.   SOFT  TISSUES: Unremarkable.   IMPRESSION: 1. Mild medial and patellofemoral compartment degenerative arthritis.   Electronically signed by: Dorethia Molt MD 12/09/2024 01:41 AM EST RP Workstation: [id number]      PAIN:   Pain Intensity: Present: 0/10, Best: 0/10, Worst: 8-9/10 Pain location: L knee  Pain quality: intermittent and sharp  Radiating pain: No  Swelling: Yes following gym interventions Focal weakness or buckling: No  PRECAUTIONS: None  WEIGHT BEARING RESTRICTIONS: No  FALLS: Has patient fallen in last 6 months? No   LIVING ENVIRONMENT: Lives with: lives with their spouse Lives in: House/apartment Stairs: Yes: Internal: 16 steps; on right going up and External: 5-6 steps; can reach both Has following equipment at home: None  OCCUPATION: Recreational Therapist   PLOF: Independent  PATIENT GOALS:  Patient would like to reduce pain, increased strength   OBJECTIVE:   Cognition Patient is oriented to person, place, and time.  Recent memory is intact.  Remote memory is intact.  Attention span and concentration are intact.  Expressive speech is intact.  Patient's fund of knowledge is within normal limits for educational level.    Gross Musculoskeletal Assessment Tremor: None Bulk: Normal Tone: Normal  GAIT:  Distance walked: 56m  Assistive device utilized: None Level of assistance: Complete Independence Comments: Reciprocal Gait Pattern  Posture:  AROM AROM (Normal range in degrees) AROM  Hip Right Left  Flexion (125)    Extension (15)    Abduction (40)    Adduction     Internal Rotation (45) 30 25  External Rotation (45)        Knee    Flexion (135) 130 130  Extension (0) 0 0      Ankle    Dorsiflexion (20)    Plantarflexion (50)    Inversion (35)    Eversion (15    (* = pain; Blank rows = not tested)  LE MMT: MMT (out of 5) Right Left  Hip flexion 4+ 4+  Hip extension    Hip abduction 4 4+  Hip adduction    Hip internal  rotation    Hip external rotation    Knee flexion 5 4+  Knee extension 5 4+  Ankle dorsiflexion 4+ 4+  Ankle plantarflexion 5 5  Ankle inversion    Ankle eversion    (* = pain; Blank rows = not tested)  Sensation Grossly intact to light touch bilateral LEs as determined by testing dermatomes L2-S2. Proprioception, and hot/cold testing deferred on this date.  Reflexes R/L Knee Jerk (L3/4): 2+/2+  Ankle Jerk (S1/2): 2+/2+   Muscle Length Hamstrings:  L: Negative  Palpation Location LEFT  RIGHT           Quadriceps 0   Medial Hamstrings 0   Lateral Hamstrings 0   Lateral Hamstring tendon    Medial Hamstring tendon    Quadriceps tendon    Patella 1   Patellar Tendon 0   Tibial Tuberosity    Medial joint line 0   Lateral joint line 0   MCL 0   LCL 0   Adductor Tubercle    Pes Anserine tendon    Infrapatellar fat pad    Fibular head    Popliteal fossa    (Blank rows = not tested) Graded on 0-4 scale (0 = no pain, 1 = pain, 2 = pain with wincing/grimacing/flinching, 3 = pain with withdrawal, 4 = unwilling to allow palpation), (Blank rows = not tested)  Passive Accessory Motion Deferred  VASCULAR Dorsalis pedis and posterior tibial pulses are palpable   SPECIAL TESTS  Ligamentous Stability  ACL: Lachman's:  L: Negative   PCL: Posterior Drawer:  L: Negative  MCL: Valgus Stress (30 degrees flexion): R L: Negative  LCL: Varus Stress (30 degrees flexion): R:L: Negative  Patellofemoral Pain Syndrome Squatting pain: L: Positive 4/10  Resisted knee extension pain:  L: Positive (6/10) Compression:  L: Positive  Patellar Tendinopathy Inferior pole palpation with anterior tilt:L: Negative   TODAY'S TREATMENT: DATE: 12/19/24  Therapeutic Exercise:   Seated Knee Extension (from 60 deg)   BLE: 1 x 10 - 15#    LLE Only: 2 x 10 x 3s hold  - 15#    RLE Only: 2 x 10 x 3s hold - 20#   Standing Tib Raises    1 x 20 reps  Time spent reviewing HEP exercises  and proper gym protocol for knee.   PATIENT EDUCATION:  Education details: POC, Prognosis, Exercise Technique Person educated: Patient Education method: Explanation, Demonstration, and Handouts Education comprehension: verbalized understanding   HOME EXERCISE PROGRAM:   Access Code: [email or web address] URL: [email or web address] Date: 12/05/2024 Prepared by: Lonni Pall   Exercises - Clamshell with Resistance  -  1 x daily - 3-4 x weekly - 2-3 sets - 10-12 reps - Supine Bridge with Resistance Band  - 1 x daily - 3-4 x weekly - 2-3 sets - 10-12 reps - Side Stepping with Resistance at Ankles  - 1 x daily - 3-4 x weekly - 2-3 sets - 10 reps - Sidelying Hip Abduction with Resistance at Thighs  - 1 x daily - 3-4 x weekly - 2-3 sets - 10-12 reps - Curtsy Squat  - 1 x daily - 3-4 x weekly - 2-3 sets - 10 reps - Hip Hiking on Step  - 1 x daily - 3-4 x weekly - 2-3 sets - 10 reps - Supine 90/90 with Shoulder Flexion and Leg Extension  - 1 x daily - 3-4 x weekly - 2-3 sets - 10-12 reps - Bird Dog  - 1 x daily - 3-4 x weekly - 2-3 sets - 10 reps - Barbell Landmine Press  - 1 x daily - 3-4 x weekly - 2-3 sets - 10-12 reps  ASSESSMENT:  CLINICAL IMPRESSION: Jeanne Velez is a 64 y.o. female who was seen today for physical therapy evaluation and treatment for bilateral knee pain. Recently discharged from POC focused on hip strengthening and management of IT band syndrome. She currently presents with deficits in LE knee strength and concordant pain with resisted knee flexion at angles greater than 60 deg. Pain improved with medial glide of patella against resistance however increased crepitus noted. Deep Squat significant for concordant pain at 90 deg of knee flexion and increased trunk flexion. Lateral step down with minimal pain however significant for minor valgus moment at the knee. Special testing significant for signs of PFPS related pain due to maltracking of the patella.  PT POC to  include continuity of care for the L hip and IT band in order to reduce lateral tension to patella. Deferred taping techniques due to adhesive allergy. Emphasis on VMO and quadricep strengthening in order to produce improved alignment of the patella during maneuvers such as squatting, kneeling or stairs. Based on her presentation, she will benefit from skilled physical therapy in order to maximize return to PLOF and improve QoL.   OBJECTIVE IMPAIRMENTS: decreased activity tolerance, decreased endurance, decreased ROM, decreased strength, and pain.   ACTIVITY LIMITATIONS: lifting, bending, squatting, and stairs  PARTICIPATION LIMITATIONS: community activity, occupation, and yard work  PERSONAL FACTORS: Age, Fitness, Past/current experiences, and Time since onset of injury/illness/exacerbation are also affecting patient's functional outcome.   REHAB POTENTIAL: Good  CLINICAL DECISION MAKING: Evolving/moderate complexity  EVALUATION COMPLEXITY: Moderate   GOALS: Goals reviewed with patient? Yes  SHORT TERM GOALS: Target date: 01/16/2025  Pt will be independent with HEP to improve strength and decrease knee pain to improve pain-free function at home and work. Baseline: Initial HEP provided Goal status: INITIAL   LONG TERM GOALS: Target date: 02/13/2025  Pt will be able to perform deep squat ( > 90 deg) with minimal to no pain in order to demonstrate significant reduction in pain and proper patellar tracking. Baseline: Pain at 90 deg  Goal status: INITIAL  2.  Pt will decrease worst knee pain by at least 3 points on the NPRS in order to demonstrate clinically significant reduction in knee pain. Baseline: 8-9/10 NPS  Goal status: INITIAL  3.  Pt will report good ability to jog at the gym > 5 min without pain in the L knee in order to demonstrate clinically significant reduction in knee/hip pain.  Baseline: 3 min, swelling and pain noted following bout. Goal status: INITIAL  4.   Pt will increase strength of L Knee Extension/Flexion by at least 1/2 MMT grade in order to demonstrate improvement in strength and function  Baseline: 12/19/2024 MMT (out of 5) Right Left  Hip flexion 4+ 4+  Hip extension    Hip abduction 4 4+  Hip adduction    Hip internal rotation    Hip external rotation    Knee flexion 5 4+  Knee extension 5 4+ **  ** indicating pain against resistance Goal status: INITIAL  PLAN:  PT FREQUENCY: 1-2x/week  PT DURATION: 8 weeks  PLANNED INTERVENTIONS: 02835- PT Re-evaluation, 97750- Physical Performance Testing, 97110-Therapeutic exercises, 97530- Therapeutic activity, 97112- Neuromuscular re-education, 97535- Self Care, [id number]- Manual therapy, Patient/Family education, Taping, and Joint mobilization  PLAN FOR NEXT SESSION: review HEP, progress knee strenghtening, continue hip strength and lateral stretching for IT band   Lonni Pall PT, DPT Physical Therapist-   12/19/2024, 7:04 PM  "

## 2024-12-19 ENCOUNTER — Ambulatory Visit: Payer: PRIVATE HEALTH INSURANCE

## 2024-12-19 ENCOUNTER — Encounter: Payer: Self-pay | Admitting: Obstetrics and Gynecology

## 2024-12-19 ENCOUNTER — Ambulatory Visit: Payer: PRIVATE HEALTH INSURANCE | Admitting: Obstetrics and Gynecology

## 2024-12-19 VITALS — BP 125/76 | HR 82 | Ht 68.0 in | Wt 180.0 lb

## 2024-12-19 DIAGNOSIS — M25561 Pain in right knee: Secondary | ICD-10-CM

## 2024-12-19 DIAGNOSIS — Z79890 Hormone replacement therapy (postmenopausal): Secondary | ICD-10-CM

## 2024-12-19 DIAGNOSIS — R6882 Decreased libido: Secondary | ICD-10-CM

## 2024-12-19 DIAGNOSIS — N951 Menopausal and female climacteric states: Secondary | ICD-10-CM

## 2024-12-19 DIAGNOSIS — M25562 Pain in left knee: Secondary | ICD-10-CM

## 2024-12-19 DIAGNOSIS — Z1231 Encounter for screening mammogram for malignant neoplasm of breast: Secondary | ICD-10-CM

## 2024-12-19 DIAGNOSIS — M25552 Pain in left hip: Secondary | ICD-10-CM

## 2024-12-19 DIAGNOSIS — M6281 Muscle weakness (generalized): Secondary | ICD-10-CM

## 2024-12-19 DIAGNOSIS — Z01419 Encounter for gynecological examination (general) (routine) without abnormal findings: Secondary | ICD-10-CM

## 2024-12-19 DIAGNOSIS — N941 Unspecified dyspareunia: Secondary | ICD-10-CM

## 2024-12-19 DIAGNOSIS — G8929 Other chronic pain: Secondary | ICD-10-CM

## 2024-12-19 MED ORDER — ESTRADIOL 0.5 MG PO TABS: 0.5000 mg | ORAL_TABLET | Freq: Every day | ORAL | 2 refills | Status: AC

## 2024-12-19 MED ORDER — ESTRADIOL 10 MCG VA TABS: ORAL_TABLET | VAGINAL | 3 refills | Status: AC

## 2024-12-19 NOTE — Patient Instructions (Signed)
 I value your feedback and you entrusting [place] with your care. If you get a [place] patient survey, I would appreciate you taking the time to let us know about your experience today. Thank you! ? ? ?

## 2024-12-23 ENCOUNTER — Ambulatory Visit: Payer: PRIVATE HEALTH INSURANCE

## 2024-12-25 ENCOUNTER — Ambulatory Visit: Payer: PRIVATE HEALTH INSURANCE

## 2025-01-01 ENCOUNTER — Ambulatory Visit: Payer: PRIVATE HEALTH INSURANCE

## 2025-01-09 ENCOUNTER — Ambulatory Visit: Payer: PRIVATE HEALTH INSURANCE

## 2025-01-14 ENCOUNTER — Ambulatory Visit: Payer: PRIVATE HEALTH INSURANCE

## 2025-01-16 ENCOUNTER — Ambulatory Visit: Payer: PRIVATE HEALTH INSURANCE

## 2025-01-21 ENCOUNTER — Ambulatory Visit: Payer: PRIVATE HEALTH INSURANCE

## 2025-01-23 ENCOUNTER — Ambulatory Visit: Payer: PRIVATE HEALTH INSURANCE

## 2025-01-27 ENCOUNTER — Ambulatory Visit: Payer: PRIVATE HEALTH INSURANCE

## 2025-01-27 ENCOUNTER — Ambulatory Visit: Payer: PRIVATE HEALTH INSURANCE | Admitting: Dermatology

## 2025-01-29 ENCOUNTER — Ambulatory Visit: Payer: PRIVATE HEALTH INSURANCE

## 2025-02-04 ENCOUNTER — Ambulatory Visit: Payer: PRIVATE HEALTH INSURANCE

## 2025-02-06 ENCOUNTER — Ambulatory Visit: Payer: PRIVATE HEALTH INSURANCE

## 2025-02-10 ENCOUNTER — Ambulatory Visit: Payer: PRIVATE HEALTH INSURANCE

## 2025-02-12 ENCOUNTER — Ambulatory Visit: Payer: PRIVATE HEALTH INSURANCE

## 2025-02-17 ENCOUNTER — Ambulatory Visit: Payer: PRIVATE HEALTH INSURANCE | Admitting: Dermatology

## 2025-02-18 ENCOUNTER — Ambulatory Visit: Payer: PRIVATE HEALTH INSURANCE

## 2025-02-20 ENCOUNTER — Ambulatory Visit: Payer: PRIVATE HEALTH INSURANCE

## 2025-02-24 ENCOUNTER — Ambulatory Visit: Payer: PRIVATE HEALTH INSURANCE

## 2025-02-26 ENCOUNTER — Ambulatory Visit: Payer: PRIVATE HEALTH INSURANCE

## 2025-03-03 ENCOUNTER — Ambulatory Visit: Payer: PRIVATE HEALTH INSURANCE

## 2025-03-05 ENCOUNTER — Ambulatory Visit: Payer: PRIVATE HEALTH INSURANCE

## 2025-03-10 ENCOUNTER — Ambulatory Visit: Payer: PRIVATE HEALTH INSURANCE

## 2025-03-13 ENCOUNTER — Ambulatory Visit: Payer: PRIVATE HEALTH INSURANCE

## 2025-05-02 ENCOUNTER — Encounter: Payer: PRIVATE HEALTH INSURANCE | Admitting: Family Medicine
